# Patient Record
Sex: Male | Born: 1940 | Race: White | Hispanic: No | Smoking: Former smoker
Health system: Southern US, Community
[De-identification: ages and names within clinical notes are randomized; demographics above are authoritative.]

## PROBLEM LIST (undated history)

## (undated) DIAGNOSIS — R918 Other nonspecific abnormal finding of lung field: Secondary | ICD-10-CM

## (undated) DIAGNOSIS — I10 Essential (primary) hypertension: Secondary | ICD-10-CM

## (undated) DIAGNOSIS — L570 Actinic keratosis: Secondary | ICD-10-CM

## (undated) DIAGNOSIS — N400 Enlarged prostate without lower urinary tract symptoms: Secondary | ICD-10-CM

## (undated) DIAGNOSIS — C801 Malignant (primary) neoplasm, unspecified: Secondary | ICD-10-CM

## (undated) DIAGNOSIS — F4310 Post-traumatic stress disorder, unspecified: Secondary | ICD-10-CM

## (undated) DIAGNOSIS — I499 Cardiac arrhythmia, unspecified: Secondary | ICD-10-CM

## (undated) DIAGNOSIS — M1990 Unspecified osteoarthritis, unspecified site: Secondary | ICD-10-CM

## (undated) DIAGNOSIS — E785 Hyperlipidemia, unspecified: Secondary | ICD-10-CM

## (undated) HISTORY — DX: Actinic keratosis: L57.0

## (undated) HISTORY — PX: BACK SURGERY: SHX140

## (undated) HISTORY — PX: PROSTATECTOMY: SHX69

## (undated) HISTORY — PX: OTHER SURGICAL HISTORY: SHX169

---

## 2001-07-11 ENCOUNTER — Inpatient Hospital Stay (HOSPITAL_COMMUNITY): Admission: RE | Admit: 2001-07-11 | Discharge: 2001-07-11 | Payer: Self-pay | Admitting: Neurosurgery

## 2004-11-10 ENCOUNTER — Ambulatory Visit: Payer: Self-pay | Admitting: Gastroenterology

## 2004-11-15 ENCOUNTER — Emergency Department: Payer: Self-pay | Admitting: Emergency Medicine

## 2005-03-01 ENCOUNTER — Inpatient Hospital Stay (HOSPITAL_COMMUNITY): Admission: RE | Admit: 2005-03-01 | Discharge: 2005-03-02 | Payer: Self-pay | Admitting: Neurosurgery

## 2006-10-09 ENCOUNTER — Ambulatory Visit: Payer: Self-pay | Admitting: Urology

## 2007-05-21 DIAGNOSIS — C4491 Basal cell carcinoma of skin, unspecified: Secondary | ICD-10-CM

## 2007-05-21 HISTORY — DX: Basal cell carcinoma of skin, unspecified: C44.91

## 2010-10-13 ENCOUNTER — Ambulatory Visit: Payer: Self-pay | Admitting: Internal Medicine

## 2011-03-20 ENCOUNTER — Ambulatory Visit: Payer: Self-pay | Admitting: Family Medicine

## 2011-07-30 ENCOUNTER — Ambulatory Visit: Payer: Self-pay | Admitting: Family Medicine

## 2012-01-30 ENCOUNTER — Ambulatory Visit: Payer: Self-pay | Admitting: Family Medicine

## 2013-01-28 ENCOUNTER — Ambulatory Visit: Payer: Self-pay | Admitting: Family Medicine

## 2014-02-08 ENCOUNTER — Ambulatory Visit: Payer: Self-pay | Admitting: Family Medicine

## 2014-06-03 DIAGNOSIS — I2510 Atherosclerotic heart disease of native coronary artery without angina pectoris: Secondary | ICD-10-CM | POA: Insufficient documentation

## 2014-06-03 DIAGNOSIS — I471 Supraventricular tachycardia: Secondary | ICD-10-CM | POA: Insufficient documentation

## 2014-08-20 DIAGNOSIS — C61 Malignant neoplasm of prostate: Secondary | ICD-10-CM | POA: Insufficient documentation

## 2015-02-09 ENCOUNTER — Encounter: Payer: Self-pay | Admitting: *Deleted

## 2015-02-10 ENCOUNTER — Ambulatory Visit: Payer: Medicare Other | Admitting: Anesthesiology

## 2015-02-10 ENCOUNTER — Encounter
Admission: RE | Disposition: A | Discharge status: Home / Self Care | Payer: Self-pay | Source: Ambulatory Visit | Attending: Unknown Physician Specialty

## 2015-02-10 ENCOUNTER — Ambulatory Visit
Admission: RE | Admit: 2015-02-10 | Discharge: 2015-02-10 | Disposition: A | Discharge status: Home / Self Care | Payer: Medicare Other | Source: Ambulatory Visit | Attending: Unknown Physician Specialty | Admitting: Unknown Physician Specialty

## 2015-02-10 DIAGNOSIS — K640 First degree hemorrhoids: Secondary | ICD-10-CM | POA: Diagnosis not present

## 2015-02-10 DIAGNOSIS — Z79899 Other long term (current) drug therapy: Secondary | ICD-10-CM | POA: Diagnosis not present

## 2015-02-10 DIAGNOSIS — E785 Hyperlipidemia, unspecified: Secondary | ICD-10-CM | POA: Insufficient documentation

## 2015-02-10 DIAGNOSIS — K621 Rectal polyp: Secondary | ICD-10-CM | POA: Diagnosis not present

## 2015-02-10 DIAGNOSIS — I10 Essential (primary) hypertension: Secondary | ICD-10-CM | POA: Diagnosis not present

## 2015-02-10 DIAGNOSIS — D123 Benign neoplasm of transverse colon: Secondary | ICD-10-CM | POA: Diagnosis not present

## 2015-02-10 DIAGNOSIS — K5730 Diverticulosis of large intestine without perforation or abscess without bleeding: Secondary | ICD-10-CM | POA: Diagnosis not present

## 2015-02-10 DIAGNOSIS — D122 Benign neoplasm of ascending colon: Secondary | ICD-10-CM | POA: Insufficient documentation

## 2015-02-10 DIAGNOSIS — D125 Benign neoplasm of sigmoid colon: Secondary | ICD-10-CM | POA: Diagnosis not present

## 2015-02-10 DIAGNOSIS — Z87891 Personal history of nicotine dependence: Secondary | ICD-10-CM | POA: Diagnosis not present

## 2015-02-10 DIAGNOSIS — Z1211 Encounter for screening for malignant neoplasm of colon: Secondary | ICD-10-CM | POA: Diagnosis present

## 2015-02-10 DIAGNOSIS — F4310 Post-traumatic stress disorder, unspecified: Secondary | ICD-10-CM | POA: Insufficient documentation

## 2015-02-10 DIAGNOSIS — D124 Benign neoplasm of descending colon: Secondary | ICD-10-CM | POA: Insufficient documentation

## 2015-02-10 DIAGNOSIS — Z7982 Long term (current) use of aspirin: Secondary | ICD-10-CM | POA: Insufficient documentation

## 2015-02-10 DIAGNOSIS — Z8546 Personal history of malignant neoplasm of prostate: Secondary | ICD-10-CM | POA: Diagnosis not present

## 2015-02-10 HISTORY — DX: Other nonspecific abnormal finding of lung field: R91.8

## 2015-02-10 HISTORY — DX: Malignant (primary) neoplasm, unspecified: C80.1

## 2015-02-10 HISTORY — PX: COLONOSCOPY: SHX5424

## 2015-02-10 HISTORY — DX: Cardiac arrhythmia, unspecified: I49.9

## 2015-02-10 HISTORY — DX: Essential (primary) hypertension: I10

## 2015-02-10 HISTORY — DX: Hyperlipidemia, unspecified: E78.5

## 2015-02-10 HISTORY — DX: Benign prostatic hyperplasia without lower urinary tract symptoms: N40.0

## 2015-02-10 HISTORY — DX: Post-traumatic stress disorder, unspecified: F43.10

## 2015-02-10 SURGERY — COLONOSCOPY
Anesthesia: General

## 2015-02-10 MED ORDER — FENTANYL CITRATE (PF) 100 MCG/2ML IJ SOLN
INTRAMUSCULAR | Status: DC | PRN
  Administered 2015-02-10: 50 ug via INTRAVENOUS

## 2015-02-10 MED ORDER — SODIUM CHLORIDE 0.9 % IV SOLN: INTRAVENOUS | Status: DC

## 2015-02-10 MED ORDER — PROPOFOL INFUSION 10 MG/ML OPTIME
INTRAVENOUS | Status: DC | PRN
  Administered 2015-02-10: 120 ug/kg/min via INTRAVENOUS

## 2015-02-10 MED ORDER — PROPOFOL 10 MG/ML IV BOLUS
INTRAVENOUS | Status: DC | PRN
  Administered 2015-02-10: 60 mg via INTRAVENOUS

## 2015-02-10 MED ORDER — MIDAZOLAM HCL 5 MG/5ML IJ SOLN
INTRAMUSCULAR | Status: DC | PRN
Start: 2015-02-10 — End: 2015-02-10
  Administered 2015-02-10: 1 mg via INTRAVENOUS

## 2015-02-10 MED ORDER — SODIUM CHLORIDE 0.9 % IV SOLN
INTRAVENOUS | Status: DC
  Administered 2015-02-10: 1000 mL via INTRAVENOUS

## 2015-02-10 NOTE — Op Note (Signed)
[facility]astroenterology Patient Name: Edwin [name] Procedure Date: 02/10/2015 9:14 AM MRN: [id number] Account #: [id number] Date of Birth: 1941-02-04 Admit Type: Outpatient Age: 74 Room: [facility] ENDO ROOM 1 Gender: Male Note Status: Finalized Procedure:         Colonoscopy Indications:       Screening for colorectal malignant neoplasm Providers:         Manya Silvas, MD Referring MD:      [name] (Referring MD) Medicines:         Propofol per Anesthesia Complications:     No immediate complications. Procedure:         Pre-Anesthesia Assessment:                    - After reviewing the risks and benefits, the patient was                     deemed in satisfactory condition to undergo the procedure.                    After obtaining informed consent, the colonoscope was                     passed under direct vision. Throughout the procedure, the                     patient's blood pressure, pulse, and oxygen saturations                     were monitored continuously. The Colonoscope was                     introduced through the anus and advanced to the the cecum,                     identified by appendiceal orifice and ileocecal valve. The                     colonoscopy was performed without difficulty. The patient                     tolerated the procedure well. The quality of the bowel                     preparation was excellent. Findings:      Six sessile polyps were found in the rectum, in the sigmoid colon, in       the descending colon, in the transverse colon, at the hepatic flexure       and in the ascending colon. The polyps were diminutive in size. These       polyps were removed with a jumbo cold forceps. Resection and retrieval       were complete.      A small polyp was found in the ascending colon. The polyp was sessile.       The polyp was removed with a cold snare. Resection and retrieval were       complete.      Internal  hemorrhoids were found during endoscopy. The hemorrhoids were       small, medium-sized and Grade I (internal hemorrhoids that do not       prolapse).      A few small-mouthed diverticula were found in the sigmoid colon and in       the  ascending colon. Impression:        - Seven diminutive polyps in the rectum, in the sigmoid                     colon, in the descending colon, in the transverse colon,                     at the hepatic flexure and in the ascending colon.                     Resected and retrieved.                    - One small polyp in the ascending colon. Resected and                     retrieved.                    - Internal hemorrhoids.                    - Diverticulosis in the sigmoid colon and in the ascending                     colon. Recommendation:    - Await pathology results. Manya Silvas, MD 02/10/2015 9:47:18 AM This report has been signed electronically. Number of Addenda: 0 Note Initiated On: 02/10/2015 9:14 AM Scope Withdrawal Time: 0 hours 18 minutes 59 seconds  Total Procedure Duration: 0 hours 23 minutes 11 seconds       [facility]

## 2015-02-10 NOTE — Anesthesia Preprocedure Evaluation (Signed)
Anesthesia Evaluation  Patient identified by MRN, date of birth, ID band Patient awake    Reviewed: Allergy & Precautions, NPO status , Patient's Chart, lab work & pertinent test results  History of Anesthesia Complications Negative for: history of anesthetic complications  Airway Mallampati: II       Dental  (+) Teeth Intact, Partial Lower   Pulmonary former smoker,    Pulmonary exam normal       Cardiovascular hypertension, Pt. on medications + dysrhythmias Rhythm:Regular     Neuro/Psych negative neurological ROS     GI/Hepatic negative GI ROS, Neg liver ROS,   Endo/Other  negative endocrine ROS  Renal/GU negative Renal ROS     Musculoskeletal negative musculoskeletal ROS (+)   Abdominal Normal abdominal exam  (+)   Peds  Hematology   Anesthesia Other Findings   Reproductive/Obstetrics negative OB ROS                             Anesthesia Physical Anesthesia Plan  ASA: II  Anesthesia Plan: General   Post-op Pain Management:    Induction: Intravenous  Airway Management Planned: Nasal Cannula  Additional Equipment:   Intra-op Plan:   Post-operative Plan:   Informed Consent: I have reviewed the patients History and Physical, chart, labs and discussed the procedure including the risks, benefits and alternatives for the proposed anesthesia with the patient or authorized representative who has indicated his/her understanding and acceptance.     Plan Discussed with: CRNA  Anesthesia Plan Comments:         Anesthesia Quick Evaluation

## 2015-02-10 NOTE — Anesthesia Postprocedure Evaluation (Signed)
  Anesthesia Post-op Note  Patient: Edwin [name]  Procedure(s) Performed: Procedure(s): COLONOSCOPY (N/A)  Anesthesia type:General  Patient location: PACU  Post pain: Pain level controlled  Post assessment: Post-op Vital signs reviewed, Patient's Cardiovascular Status Stable, Respiratory Function Stable, Patent Airway and No signs of Nausea or vomiting  Post vital signs: Reviewed and stable  Last Vitals:  Filed Vitals:   02/10/15 0948  BP: 100/62  Pulse:   Temp: 36 C  Resp: 12    Level of consciousness: awake, alert  and patient cooperative  Complications: No apparent anesthesia complications

## 2015-02-10 NOTE — Transfer of Care (Signed)
Immediate Anesthesia Transfer of Care Note  Patient: Edwin [name]  Procedure(s) Performed: Procedure(s): COLONOSCOPY (N/A)  Patient Location: PACU  Anesthesia Type:General  Level of Consciousness: awake, alert , oriented and patient cooperative  Airway & Oxygen Therapy: Patient Spontanous Breathing and Patient connected to nasal cannula oxygen  Post-op Assessment: Report given to RN and Post -op Vital signs reviewed and stable  Post vital signs: Reviewed and stable  Last Vitals:  Filed Vitals:   02/10/15 0948  BP: 100/62  Pulse:   Temp: 36 C  Resp: 12    Complications: No apparent anesthesia complications

## 2015-02-10 NOTE — H&P (Signed)
Primary Care Physician:  [name], MD Primary Gastroenterologist:  Dr. Vira Agar  Pre-Procedure History & Physical: HPI:  Edwin [name] is a 75 y.o. male is here for an colonoscopy.   Past Medical History  Diagnosis Date  . Cancer     Prostate Cancer  . Hyperlipidemia   . Dysrhythmia     SVT  . Pulmonary nodules   . Hypertension   . BPH (benign prostatic hyperplasia)   . PTSD (post-traumatic stress disorder)     Past Surgical History  Procedure Laterality Date  . Prostatectomy    . Back surgery      Laminectomy x 3  . Surgery for urethral stricture      Prior to Admission medications   Medication Sig Start Date End Date Taking? Authorizing Provider  aspirin EC 81 MG tablet Take 81 mg by mouth daily.   Yes Historical Provider, MD  chlorthalidone (HYGROTON) 25 MG tablet Take 25 mg by mouth daily.   Yes Historical Provider, MD  co-enzyme Q-10 30 MG capsule Take 30 mg by mouth 3 (three) times daily.   Yes Historical Provider, MD  Javier Docker Oil 1000 MG CAPS Take 2 capsules by mouth daily.   Yes Historical Provider, MD  MAGNESIUM GLUCONATE PO Take 400 mg by mouth at bedtime.   Yes Historical Provider, MD  Multiple Vitamin (MULTIVITAMIN) tablet Take 1 tablet by mouth daily.   Yes Historical Provider, MD  Ubiquinol 50 MG CAPS Take 1 tablet by mouth.   Yes Historical Provider, MD    Allergies as of 11/30/2014  . (Not on File)    History reviewed. No pertinent family history.  History   Social History  . Marital Status: Married    Spouse Name: N/A  . Number of Children: N/A  . Years of Education: N/A   Occupational History  . Not on file.   Social History Main Topics  . Smoking status: Former Smoker -- 0.70 packs/day for 5 years    Types: Cigarettes    Quit date: 07/23/1965  . Smokeless tobacco: Never Used  . Alcohol Use: No  . Drug Use: No  . Sexual Activity: Not on file   Other Topics Concern  . Not on file   Social History Narrative    Review of  Systems: See HPI, otherwise negative ROS  Physical Exam: BP 150/103 mmHg  Pulse 90  Temp(Src) 97 F (36.1 C) (Tympanic)  Resp 18  Ht 6' 1.5" (1.867 m)  Wt 102.059 kg (225 lb)  BMI 29.28 kg/m2  SpO2 97% General:   Alert,  pleasant and cooperative in NAD Head:  Normocephalic and atraumatic. Neck:  Supple; no masses or thyromegaly. Lungs:  Clear throughout to auscultation.    Heart:  Regular rate and rhythm. Abdomen:  Soft, nontender and nondistended. Normal bowel sounds, without guarding, and without rebound.   Neurologic:  Alert and  oriented x4;  grossly normal neurologically.  Impression/Plan: Edwin [name] is here for an colonoscopy to be performed for screening  Risks, benefits, limitations, and alternatives regarding  colonoscopy have been reviewed with the patient.  Questions have been answered.  All parties agreeable.   Gaylyn Cheers, MD  02/10/2015, 9:03 AM   Primary Care Physician:  [name], MD Primary Gastroenterologist:  Dr. Vira Agar  Pre-Procedure History & Physical: HPI:  Edwin [name] is a 74 y.o. male is here for an colonoscopy.   Past Medical History  Diagnosis Date  . Cancer  Prostate Cancer  . Hyperlipidemia   . Dysrhythmia     SVT  . Pulmonary nodules   . Hypertension   . BPH (benign prostatic hyperplasia)   . PTSD (post-traumatic stress disorder)     Past Surgical History  Procedure Laterality Date  . Prostatectomy    . Back surgery      Laminectomy x 3  . Surgery for urethral stricture      Prior to Admission medications   Medication Sig Start Date End Date Taking? Authorizing Provider  aspirin EC 81 MG tablet Take 81 mg by mouth daily.   Yes Historical Provider, MD  chlorthalidone (HYGROTON) 25 MG tablet Take 25 mg by mouth daily.   Yes Historical Provider, MD  co-enzyme Q-10 30 MG capsule Take 30 mg by mouth 3 (three) times daily.   Yes Historical Provider, MD  Javier Docker Oil 1000 MG CAPS Take 2 capsules by mouth daily.    Yes Historical Provider, MD  MAGNESIUM GLUCONATE PO Take 400 mg by mouth at bedtime.   Yes Historical Provider, MD  Multiple Vitamin (MULTIVITAMIN) tablet Take 1 tablet by mouth daily.   Yes Historical Provider, MD  Ubiquinol 50 MG CAPS Take 1 tablet by mouth.   Yes Historical Provider, MD    Allergies as of 11/30/2014  . (Not on File)    History reviewed. No pertinent family history.  History   Social History  . Marital Status: Married    Spouse Name: N/A  . Number of Children: N/A  . Years of Education: N/A   Occupational History  . Not on file.   Social History Main Topics  . Smoking status: Former Smoker -- 0.70 packs/day for 5 years    Types: Cigarettes    Quit date: 07/23/1965  . Smokeless tobacco: Never Used  . Alcohol Use: No  . Drug Use: No  . Sexual Activity: Not on file   Other Topics Concern  . Not on file   Social History Narrative    Review of Systems: See HPI, otherwise negative ROS  Physical Exam: BP 150/103 mmHg  Pulse 90  Temp(Src) 97 F (36.1 C) (Tympanic)  Resp 18  Ht 6' 1.5" (1.867 m)  Wt 102.059 kg (225 lb)  BMI 29.28 kg/m2  SpO2 97% General:   Alert,  pleasant and cooperative in NAD Head:  Normocephalic and atraumatic. Neck:  Supple; no masses or thyromegaly. Lungs:  Clear throughout to auscultation.    Heart:  Regular rate and rhythm. Abdomen:  Soft, nontender and nondistended. Normal bowel sounds, without guarding, and without rebound.   Neurologic:  Alert and  oriented x4;  grossly normal neurologically.  Impression/Plan: Edwin [name] is here for an colonoscopy to be performed for screening  Risks, benefits, limitations, and alternatives regarding  colonoscopy have been reviewed with the patient.  Questions have been answered.  All parties agreeable.   Gaylyn Cheers, MD  02/10/2015, 9:03 AM

## 2015-02-11 LAB — SURGICAL PATHOLOGY

## 2015-02-14 ENCOUNTER — Encounter: Payer: Self-pay | Admitting: Unknown Physician Specialty

## 2015-05-19 DIAGNOSIS — E782 Mixed hyperlipidemia: Secondary | ICD-10-CM | POA: Insufficient documentation

## 2015-05-19 DIAGNOSIS — I10 Essential (primary) hypertension: Secondary | ICD-10-CM | POA: Insufficient documentation

## 2016-09-25 ENCOUNTER — Encounter: Payer: Self-pay | Admitting: Family Medicine

## 2016-09-25 ENCOUNTER — Ambulatory Visit (INDEPENDENT_AMBULATORY_CARE_PROVIDER_SITE_OTHER): Payer: Medicare Other | Admitting: Family Medicine

## 2016-09-25 DIAGNOSIS — M9982 Other biomechanical lesions of thoracic region: Secondary | ICD-10-CM | POA: Diagnosis not present

## 2016-09-25 DIAGNOSIS — M999 Biomechanical lesion, unspecified: Secondary | ICD-10-CM | POA: Insufficient documentation

## 2016-09-25 DIAGNOSIS — M5416 Radiculopathy, lumbar region: Secondary | ICD-10-CM | POA: Insufficient documentation

## 2016-09-25 DIAGNOSIS — Z8546 Personal history of malignant neoplasm of prostate: Secondary | ICD-10-CM | POA: Insufficient documentation

## 2016-09-25 DIAGNOSIS — M216X9 Other acquired deformities of unspecified foot: Secondary | ICD-10-CM | POA: Insufficient documentation

## 2016-09-25 MED ORDER — GABAPENTIN 100 MG PO CAPS: 200.0000 mg | ORAL_CAPSULE | Freq: Every day | ORAL | 3 refills | Status: DC

## 2016-09-25 NOTE — Assessment & Plan Note (Signed)
Decision today to treat with OMT was based on Physical Exam  After verbal consent patient was treated with HVLA, ME, FPR techniques in , cervical, thoracic, and sacral areas  Patient tolerated the procedure well with improvement in symptoms  Patient given exercises, stretches and lifestyle modifications  See medications in patient instructions if given  Patient will follow up in 4 weeks

## 2016-09-25 NOTE — Assessment & Plan Note (Signed)
Discussed over-the-counter orthotics, home exercises, work with Product/process development scientist, discussed proper shoes. Follow-up again in 4 weeks. Could be a candidate for custom orthotics if necessary.

## 2016-09-25 NOTE — Progress Notes (Signed)
[name] [address], [place] [address] Phone: [phone] Subjective:    I'm seeing this patient by the request  of:  [name], MD   CC: Foot pain and low back pain  [id number]  Edwin [name] is a 76 y.o. male coming in with complaint of foot pain. Patient states that he is had this pain for quite some time. Seems to be worse in the left greater than right. Patient is very active doing cycling as well as swimming a very regular basis. Patient continues to try to be active. Patient past medical history significant for 3 separate back surgeries but no surgeries in the feet. Patient has noticed significant weakness of raising his large toe. States that this makes it very difficult to do some of the packing. Does not feel like is tripping though. Denies any numbness. Patient states that the feet and her them on a regular basis. Has noticed some more when he is on his bike for an extended amount of time. No swelling.  Patient is also complaining of low back pain. Has had a past medical history significant for 3 previous back surgeries. States he does have some pain from time to time especially on the left side. An states it seems to be though constant now. Denies any fevers, chills, any abnormal weight loss. States it is just more of a soreness. Continues to be active on a regular basis. Rates the severity pain is 6 out of 10 and worsening. Past medical history is significant for prostate cancer.      Past Medical History:  Diagnosis Date  . BPH (benign prostatic hyperplasia)   . Cancer [facility])    Prostate Cancer  . Dysrhythmia    SVT  . Hyperlipidemia   . Hypertension   . PTSD (post-traumatic stress disorder)   . Pulmonary nodules    Past Surgical History:  Procedure Laterality Date  . BACK SURGERY     Laminectomy x 3  . COLONOSCOPY N/A 02/10/2015   Procedure: COLONOSCOPY;  Surgeon: Manya Silvas, MD;  Location: [facility] ENDOSCOPY;   Service: Endoscopy;  Laterality: N/A;  . PROSTATECTOMY    . Surgery for Urethral Stricture     Social History   Social History  . Marital status: Married    Spouse name: N/A  . Number of children: N/A  . Years of education: N/A   Social History Main Topics  . Smoking status: Former Smoker    Packs/day: 0.70    Years: 5.00    Types: Cigarettes    Quit date: 07/23/1965  . Smokeless tobacco: Never Used  . Alcohol use No  . Drug use: No  . Sexual activity: Not Asked   Other Topics Concern  . None   Social History Narrative  . None   Allergies  Allergen Reactions  . Hydrochlorothiazide   . Lipitor [Atorvastatin]   . Lisinopril   . Neosporin [Neomycin-Bacitracin Zn-Polymyx]    No family history on file. No family history rheumatological diseases  Past medical history, social, surgical and family history all reviewed in electronic medical record.  No pertanent information unless stated regarding to the chief complaint.   Review of Systems:Review of systems updated and as accurate as of 09/25/16  No headache, visual changes, nausea, vomiting, diarrhea, constipation, dizziness, abdominal pain, skin rash, fevers, chills, night sweats, weight loss, swollen lymph nodes,  joint swelling, , chest pain, shortness of breath, mood changes. Positive muscle aches, body  aches  Objective  Blood pressure (!) 152/84, pulse 74, height 6\' 1"  (1.854 m), weight 213 lb (96.6 kg). Systems examined below as of 09/25/16   General: No apparent distress alert and oriented x3 mood and affect normal, dressed appropriately.  HEENT: Pupils equal, extraocular movements intact  Respiratory: Patient's speak in full sentences and does not appear short of breath  Cardiovascular: No lower extremity edema, non tender, no erythema  Skin: Warm dry intact with no signs of infection or rash on extremities or on axial skeleton.  Abdomen: Soft nontender  Neuro: Cranial nerves II through XII are intact,  neurovascularly intact in all extremities with 2+ DTRs and 2+ pulses.  Lymph: No lymphadenopathy of posterior or anterior cervical chain or axillae bilaterally.  Gait normal with good balance and coordination.  MSK:  Non tender with full range of motion and good stability and symmetric strength and tone of shoulders, elbows, wrist, hip, knee and ankles bilaterally. Arthritic changes of multiple joints Back Exam:  Inspection: Unremarkable incision well-healed from previous surgeries Motion: Flexion 35 deg, Extension 15 deg, Side Bending to 25 deg bilaterally,  Rotation to 20 deg bilaterally  SLR laying: Negative severe tightness of the hamstrings bilaterally XSLR laying: Negative  Palpable tenderness: Moderate tenderness over the paraspinal musculature of the lumbar spine bilaterally left greater than right. FABER: Severe tightness bilaterally Sensory change: Gross sensation intact to all lumbar and sacral dermatomes.  Reflexes: 2+ at both patellar tendons, 2+ at achilles tendons, Babinski's downgoing.  Strength at foot  Patient has significant weakness of extension of the large toe on the left sign. Foot exam shoes narrow midfoot with breakdown of the longitudinal arch. Patient's Lang Snow of the transverse arch. Weakness of the left noted. Patient is very minimal inversion of the ankle against resistance as well.  Osteopathic findings C2 flexed rotated and side bent right T3 extended rotated and side bent right inhaled third rib T9 extended rotated and side bent left L2 flexed rotated and side bent right Sacrum right on right  Procedure note 97110; 15 minutes spent for Therapeutic exercises as stated in above notes.  This included exercises focusing on stretching, strengthening, with significant focus on eccentric aspects. Low back exercises that included:  Pelvic tilt/bracing instruction to focus on control of the pelvic girdle and lower abdominal muscles  Glute strengthening exercises,  focusing on proper firing of the glutes without engaging the low back muscles Proper stretching techniques for maximum relief for the hamstrings, hip flexors, low back and some rotation where tolerated  Exercises for the foot include:  Stretches to help lengthen the lower leg and plantar fascia areas Theraband exercises for the lower leg and ankle to help strengthen the surrounding area- dorsiflexion, plantarflexion, inversion, eversion Massage rolling on the plantar surface of the foot with a frozen bottle, tennis ball or golf ball Towel or marble pick-ups to strengthen the plantar surface of the foot Weight bearing exercises to increase balance and overall stability    Proper technique shown and discussed handout in great detail with ATC.  All questions were discussed and answered.     Impression and Recommendations:     This case required medical decision making of moderate complexity.      Note: This dictation was prepared with Dragon dictation along with smaller phrase technology. Any transcriptional errors that result from this process are unintentional.

## 2016-09-25 NOTE — Assessment & Plan Note (Signed)
Patient is having somewhat of a lumbar radiculopathy. 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 This is contributing to the weakness of the foot. Discussed with patient about potential further workup which patient states that he does not want any surgical intervention. Patient will start on gabapentin at this time. We discussed icing regimen, discussed proper shoes. Patient will try to make these changes and come back and see me again in 3-4 weeks.

## 2016-09-25 NOTE — Patient Instructions (Signed)
Good to see you.  Ice 20 minutes 2 times daily. Usually after activity and before bed. Exercises 3 times a week.  For the foot  Newton shoes could be good Sp[employer]co orthotics "total support" online would be great Look for thin or slim as well for the biking shoes.  Avoid being barefoot.  I think som eis from your back and start gabapentin 200mg  at night For the back hip flexor is key  Vitamin D 2000 IU daily  See me again in 4 weeks.

## 2016-10-23 ENCOUNTER — Ambulatory Visit (INDEPENDENT_AMBULATORY_CARE_PROVIDER_SITE_OTHER): Payer: Medicare Other | Admitting: Family Medicine

## 2016-10-23 ENCOUNTER — Encounter: Payer: Self-pay | Admitting: Family Medicine

## 2016-10-23 VITALS — BP 136/78 | HR 74 | Resp 16 | Wt 214.5 lb

## 2016-10-23 DIAGNOSIS — M999 Biomechanical lesion, unspecified: Secondary | ICD-10-CM

## 2016-10-23 DIAGNOSIS — M5416 Radiculopathy, lumbar region: Secondary | ICD-10-CM | POA: Diagnosis not present

## 2016-10-23 NOTE — Assessment & Plan Note (Signed)
Decision today to treat with OMT was based on Physical Exam  After verbal consent patient was treated with HVLA, ME, FPR techniques in cervical, thoracic, lumbar and sacral areas  Patient tolerated the procedure well with improvement in symptoms  Patient given exercises, stretches and lifestyle modifications  See medications in patient instructions if given  Patient will follow up in 4-8 weeks 

## 2016-10-23 NOTE — Assessment & Plan Note (Signed)
Stable at moment.   No change in management.

## 2016-10-23 NOTE — Progress Notes (Signed)
Pre-visit discussion using our clinic review tool. No additional management support is needed unless otherwise documented below in the visit note.  

## 2016-10-23 NOTE — Progress Notes (Signed)
[name] [address], [place] [address] Phone: [phone] Subjective:    I'm seeing this patient by the request  of:  [name], MD   CC: Foot pain and low back pain f/u  [id number]  Edwin [name] is a 75 y.o. male coming in with complaint of foot pain. Patient states that he is had this pain for quite some time. Seems to be worse in the left greater than right. Patient though is significantly better with over-the-counter orthotics. No new symptoms. States that everything seems to be feeling much better.  Patient is also complaining of low back pain. Patient states that the manipulation has been significantly improved. Working on the core instability which has also been helpful as well.      Past Medical History:  Diagnosis Date  . BPH (benign prostatic hyperplasia)   . Cancer [facility])    Prostate Cancer  . Dysrhythmia    SVT  . Hyperlipidemia   . Hypertension   . PTSD (post-traumatic stress disorder)   . Pulmonary nodules    Past Surgical History:  Procedure Laterality Date  . BACK SURGERY     Laminectomy x 3  . COLONOSCOPY N/A 02/10/2015   Procedure: COLONOSCOPY;  Surgeon: Manya Silvas, MD;  Location: [facility] ENDOSCOPY;  Service: Endoscopy;  Laterality: N/A;  . PROSTATECTOMY    . Surgery for Urethral Stricture     Social History   Social History  . Marital status: Married    Spouse name: N/A  . Number of children: N/A  . Years of education: N/A   Social History Main Topics  . Smoking status: Former Smoker    Packs/day: 0.70    Years: 5.00    Types: Cigarettes    Quit date: 07/23/1965  . Smokeless tobacco: Never Used  . Alcohol use No  . Drug use: No  . Sexual activity: Not Asked   Other Topics Concern  . None   Social History Narrative  . None   Allergies  Allergen Reactions  . Hydrochlorothiazide   . Lipitor [Atorvastatin]   . Lisinopril   . Neosporin [Neomycin-Bacitracin Zn-Polymyx]     History reviewed. No pertinent family history. No family history rheumatological diseases  Past medical history, social, surgical and family history all reviewed in electronic medical record.  No pertanent information unless stated regarding to the chief complaint.   Review of Systems: No headache, visual changes, nausea, vomiting, diarrhea, constipation, dizziness, abdominal pain, skin rash, fevers, chills, night sweats, weight loss, swollen lymph nodes, body aches, joint swelling, muscle aches, chest pain, shortness of breath, mood changes.    Objective  Blood pressure 136/78, pulse 74, resp. rate 16, weight 214 lb 8 oz (97.3 kg), SpO2 97 %.   Systems examined below as of 10/23/16 General: NAD A&O x3 mood, affect normal  HEENT: Pupils equal, extraocular movements intact no nystagmus Respiratory: not short of breath at rest or with speaking Cardiovascular: No lower extremity edema, non tender Skin: Warm dry intact with no signs of infection or rash on extremities or on axial skeleton. Abdomen: Soft nontender, no masses Neuro: Cranial nerves  intact, neurovascularly intact in all extremities with 2+ DTRs and 2+ pulses. Lymph: No lymphadenopathy appreciated today  Gait normal with good balance and coordination.  MSK:  Non tender with full range of motion and good stability and symmetric strength and tone of shoulders, elbows, wrist, hip, knee and ankles bilaterally. Arthritic changes of multiple joints Back  Exam:  Inspection: Unremarkable incision well-healed from previous surgeries Motion: Flexion 35 deg, Extension 15 deg, Side Bending to 25 deg bilaterally,  Rotation to 20 deg bilaterally  SLR laying: Negative severe tightness of the hamstrings bilaterally XSLR laying: Negative  Palpable tenderness: Tenderness to palpation of the paraspinal musculature lumbar spine record of left FABER: Mild tightness bilaterally. Sensory change: Gross sensation intact to all lumbar and sacral  dermatomes.  Reflexes: 2+ at both patellar tendons, 2+ at achilles tendons, Babinski's downgoing.  Strength at foot  Mild weakness still noted of the left large toe Foot exam shoes narrow midfoot with breakdown of the longitudinal arch. Patient's Lang Snow of the transverse arch.   Osteopathic findings Cervical C2 flexed rotated and side bent right C5 flexed rotated and side bent left T3 extended rotated and side bent right inhaled third rib T7 extended rotated and side bent left L3 flexed rotated and side bent right Sacrum right on right      Impression and Recommendations:     This case required medical decision making of moderate complexity.      Note: This dictation was prepared with Dragon dictation along with smaller phrase technology. Any transcriptional errors that result from this process are unintentional.

## 2016-10-23 NOTE — Patient Instructions (Signed)
Awesome 6 weeks

## 2016-12-04 ENCOUNTER — Ambulatory Visit: Payer: Medicare Other | Admitting: Family Medicine

## 2017-03-13 DIAGNOSIS — D099 Carcinoma in situ, unspecified: Secondary | ICD-10-CM

## 2017-03-13 HISTORY — DX: Carcinoma in situ, unspecified: D09.9

## 2018-01-01 ENCOUNTER — Other Ambulatory Visit: Payer: Self-pay | Admitting: Family Medicine

## 2018-01-01 DIAGNOSIS — R972 Elevated prostate specific antigen [PSA]: Secondary | ICD-10-CM

## 2018-01-10 ENCOUNTER — Ambulatory Visit
Admission: RE | Admit: 2018-01-10 | Discharge: 2018-01-10 | Disposition: A | Discharge status: Home / Self Care | Payer: Medicare Other | Source: Ambulatory Visit | Attending: Family Medicine | Admitting: Family Medicine

## 2018-01-10 ENCOUNTER — Encounter
Admission: RE | Admit: 2018-01-10 | Discharge: 2018-01-10 | Disposition: A | Discharge status: Home / Self Care | Payer: Medicare Other | Source: Ambulatory Visit | Attending: Family Medicine | Admitting: Family Medicine

## 2018-01-10 DIAGNOSIS — M5136 Other intervertebral disc degeneration, lumbar region: Secondary | ICD-10-CM | POA: Insufficient documentation

## 2018-01-10 DIAGNOSIS — R972 Elevated prostate specific antigen [PSA]: Secondary | ICD-10-CM | POA: Insufficient documentation

## 2018-01-10 DIAGNOSIS — Z8546 Personal history of malignant neoplasm of prostate: Secondary | ICD-10-CM | POA: Diagnosis not present

## 2018-01-10 MED ORDER — TECHNETIUM TC 99M MEDRONATE IV KIT
20.0000 | PACK | Freq: Once | INTRAVENOUS | Status: AC | PRN
  Administered 2018-01-10: 21.7 via INTRAVENOUS

## 2018-01-14 ENCOUNTER — Ambulatory Visit
Admission: RE | Admit: 2018-01-14 | Discharge: 2018-01-14 | Disposition: A | Discharge status: Home / Self Care | Payer: Medicare Other | Source: Ambulatory Visit | Attending: Family Medicine | Admitting: Family Medicine

## 2018-01-14 DIAGNOSIS — Z8546 Personal history of malignant neoplasm of prostate: Secondary | ICD-10-CM | POA: Diagnosis present

## 2018-01-14 DIAGNOSIS — R918 Other nonspecific abnormal finding of lung field: Secondary | ICD-10-CM | POA: Insufficient documentation

## 2018-01-14 DIAGNOSIS — R972 Elevated prostate specific antigen [PSA]: Secondary | ICD-10-CM

## 2018-01-14 DIAGNOSIS — Z9079 Acquired absence of other genital organ(s): Secondary | ICD-10-CM | POA: Diagnosis not present

## 2018-01-14 DIAGNOSIS — N3289 Other specified disorders of bladder: Secondary | ICD-10-CM | POA: Diagnosis not present

## 2018-01-14 MED ORDER — IOPAMIDOL (ISOVUE-300) INJECTION 61%
100.0000 mL | Freq: Once | INTRAVENOUS | Status: AC | PRN
  Administered 2018-01-14: 100 mL via INTRAVENOUS

## 2018-09-02 DIAGNOSIS — E6609 Other obesity due to excess calories: Secondary | ICD-10-CM | POA: Insufficient documentation

## 2018-09-02 DIAGNOSIS — Z6830 Body mass index (BMI) 30.0-30.9, adult: Secondary | ICD-10-CM | POA: Insufficient documentation

## 2018-09-08 ENCOUNTER — Other Ambulatory Visit: Payer: Self-pay | Admitting: Family Medicine

## 2018-09-08 DIAGNOSIS — R972 Elevated prostate specific antigen [PSA]: Secondary | ICD-10-CM

## 2018-09-08 DIAGNOSIS — Z8546 Personal history of malignant neoplasm of prostate: Secondary | ICD-10-CM

## 2018-09-19 ENCOUNTER — Ambulatory Visit: Admission: RE | Admit: 2018-09-19 | Payer: Medicare Other | Source: Ambulatory Visit

## 2018-09-19 ENCOUNTER — Ambulatory Visit: Payer: Medicare Other

## 2018-09-22 ENCOUNTER — Encounter
Admission: RE | Admit: 2018-09-22 | Discharge: 2018-09-22 | Disposition: A | Discharge status: Home / Self Care | Payer: Medicare Other | Source: Ambulatory Visit | Attending: Family Medicine | Admitting: Family Medicine

## 2018-09-22 ENCOUNTER — Ambulatory Visit
Admission: RE | Admit: 2018-09-22 | Discharge: 2018-09-22 | Disposition: A | Discharge status: Home / Self Care | Payer: Medicare Other | Source: Ambulatory Visit | Attending: Family Medicine | Admitting: Family Medicine

## 2018-09-22 DIAGNOSIS — Z8546 Personal history of malignant neoplasm of prostate: Secondary | ICD-10-CM

## 2018-09-22 DIAGNOSIS — R972 Elevated prostate specific antigen [PSA]: Secondary | ICD-10-CM | POA: Diagnosis present

## 2018-09-22 LAB — POCT I-STAT CREATININE: Creatinine, Ser: 1 mg/dL (ref 0.61–1.24)

## 2018-09-22 MED ORDER — IOHEXOL 300 MG/ML  SOLN
100.0000 mL | Freq: Once | INTRAMUSCULAR | Status: AC | PRN
  Administered 2018-09-22: 100 mL via INTRAVENOUS

## 2018-09-22 MED ORDER — TECHNETIUM TC 99M MEDRONATE IV KIT
20.0000 | PACK | Freq: Once | INTRAVENOUS | Status: AC | PRN
  Administered 2018-09-22: 22.62 via INTRAVENOUS

## 2019-01-27 DIAGNOSIS — I10 Essential (primary) hypertension: Secondary | ICD-10-CM | POA: Insufficient documentation

## 2019-01-27 DIAGNOSIS — E871 Hypo-osmolality and hyponatremia: Secondary | ICD-10-CM | POA: Insufficient documentation

## 2019-01-27 DIAGNOSIS — U071 COVID-19: Secondary | ICD-10-CM | POA: Insufficient documentation

## 2019-05-01 ENCOUNTER — Other Ambulatory Visit: Payer: Self-pay | Admitting: Family Medicine

## 2019-05-01 DIAGNOSIS — R972 Elevated prostate specific antigen [PSA]: Secondary | ICD-10-CM

## 2019-05-01 DIAGNOSIS — Z8546 Personal history of malignant neoplasm of prostate: Secondary | ICD-10-CM

## 2019-05-05 ENCOUNTER — Other Ambulatory Visit: Payer: Self-pay | Admitting: Family Medicine

## 2019-05-12 ENCOUNTER — Other Ambulatory Visit: Payer: Self-pay | Admitting: Family Medicine

## 2019-05-12 DIAGNOSIS — Z8546 Personal history of malignant neoplasm of prostate: Secondary | ICD-10-CM

## 2019-05-12 DIAGNOSIS — R972 Elevated prostate specific antigen [PSA]: Secondary | ICD-10-CM

## 2019-05-13 ENCOUNTER — Ambulatory Visit
Admission: RE | Admit: 2019-05-13 | Discharge: 2019-05-13 | Disposition: A | Discharge status: Home / Self Care | Payer: Medicare Other | Source: Ambulatory Visit | Attending: Family Medicine | Admitting: Family Medicine

## 2019-05-13 ENCOUNTER — Other Ambulatory Visit: Payer: Self-pay

## 2019-05-13 DIAGNOSIS — R918 Other nonspecific abnormal finding of lung field: Secondary | ICD-10-CM | POA: Insufficient documentation

## 2019-05-13 DIAGNOSIS — R972 Elevated prostate specific antigen [PSA]: Secondary | ICD-10-CM

## 2019-05-13 DIAGNOSIS — Z8546 Personal history of malignant neoplasm of prostate: Secondary | ICD-10-CM

## 2019-05-13 DIAGNOSIS — I700 Atherosclerosis of aorta: Secondary | ICD-10-CM | POA: Diagnosis not present

## 2019-05-13 LAB — POCT I-STAT CREATININE: Creatinine, Ser: 0.8 mg/dL (ref 0.61–1.24)

## 2019-05-13 MED ORDER — IOHEXOL 300 MG/ML  SOLN
100.0000 mL | Freq: Once | INTRAMUSCULAR | Status: AC | PRN
  Administered 2019-05-13: 100 mL via INTRAVENOUS

## 2019-05-13 MED ORDER — TECHNETIUM TC 99M MEDRONATE IV KIT
20.0000 | PACK | Freq: Once | INTRAVENOUS | Status: AC | PRN
  Administered 2019-05-13: 21.612 via INTRAVENOUS

## 2019-06-25 ENCOUNTER — Encounter: Payer: Self-pay | Admitting: Family Medicine

## 2019-06-25 ENCOUNTER — Ambulatory Visit (INDEPENDENT_AMBULATORY_CARE_PROVIDER_SITE_OTHER): Payer: Medicare Other | Admitting: Family Medicine

## 2019-06-25 ENCOUNTER — Telehealth: Payer: Self-pay

## 2019-06-25 VITALS — BP 150/80 | HR 60 | Ht 73.0 in | Wt 220.0 lb

## 2019-06-25 DIAGNOSIS — M9981 Other biomechanical lesions of cervical region: Secondary | ICD-10-CM

## 2019-06-25 DIAGNOSIS — M999 Biomechanical lesion, unspecified: Secondary | ICD-10-CM

## 2019-06-25 DIAGNOSIS — M5416 Radiculopathy, lumbar region: Secondary | ICD-10-CM | POA: Diagnosis not present

## 2019-06-25 NOTE — Progress Notes (Signed)
[name] [address] [place], [place] [address] Phone: [phone] Subjective:   I Edwin [name] am serving as a Education administrator for Dr. Hulan Saas.  This visit occurred during the SARS-CoV-2 public health emergency.  Safety protocols were in place, including screening questions prior to the visit, additional usage of staff PPE, and extensive cleaning of exam room while observing appropriate contact time as indicated for disinfecting solutions.    CC: Low back pain follow-up  [id number]  Edwin [name] is a 78 y.o. male coming in with complaint of back pain. Last seen in 2018 for OMT. Patient states having some increasing tightness.  Rides bikes on a regular routine.  Patient states sitting long time.  Patient denies any radiation down the legs.  Patient states somewhat sometimes can have some stiffness of his muscles.  Seems to do well with chronic vitamin supplementation.  Tries to be active every day.       Past Medical History:  Diagnosis Date  . BPH (benign prostatic hyperplasia)   . Cancer [facility])    Prostate Cancer  . Dysrhythmia    SVT  . Hyperlipidemia   . Hypertension   . PTSD (post-traumatic stress disorder)   . Pulmonary nodules    Past Surgical History:  Procedure Laterality Date  . BACK SURGERY     Laminectomy x 3  . COLONOSCOPY N/A 02/10/2015   Procedure: COLONOSCOPY;  Surgeon: Manya Silvas, MD;  Location: [facility] ENDOSCOPY;  Service: Endoscopy;  Laterality: N/A;  . PROSTATECTOMY    . Surgery for Urethral Stricture     Social History   Socioeconomic History  . Marital status: Married    Spouse name: Not on file  . Number of children: Not on file  . Years of education: Not on file  . Highest education level: Not on file  Occupational History  . Not on file  Social Needs  . Financial resource strain: Not on file  . Food insecurity    Worry: Not on file    Inability: Not on file  . Transportation needs    Medical: Not on  file    Non-medical: Not on file  Tobacco Use  . Smoking status: Former Smoker    Packs/day: 0.70    Years: 5.00    Pack years: 3.50    Types: Cigarettes    Quit date: 07/23/1965    Years since quitting: 53.9  . Smokeless tobacco: Never Used  Substance and Sexual Activity  . Alcohol use: No  . Drug use: No  . Sexual activity: Not on file  Lifestyle  . Physical activity    Days per week: Not on file    Minutes per session: Not on file  . Stress: Not on file  Relationships  . Social Herbalist on phone: Not on file    Gets together: Not on file    Attends religious service: Not on file    Active member of club or organization: Not on file    Attends meetings of clubs or organizations: Not on file    Relationship status: Not on file  Other Topics Concern  . Not on file  Social History Narrative  . Not on file   Allergies  Allergen Reactions  . Hydrochlorothiazide   . Lipitor [Atorvastatin]   . Lisinopril   . Neosporin [Neomycin-Bacitracin Zn-Polymyx]    No family history on file.   Current Outpatient Medications (Cardiovascular):  .  valsartan (DIOVAN) 80 MG tablet, Take by mouth.   Current Outpatient Medications (Analgesics):  .  aspirin EC 81 MG tablet, Take 81 mg by mouth daily.   Current Outpatient Medications (Other):  .  co-enzyme Q-10 30 MG capsule, Take 30 mg by mouth 3 (three) times daily. Marland Kitchen  gabapentin (NEURONTIN) 100 MG capsule, Take 2 capsules (200 mg total) by mouth at bedtime. Edwin [name] Oil 1000 MG CAPS, Take 2 capsules by mouth daily. Marland Kitchen  MAGNESIUM GLUCONATE PO, Take 400 mg by mouth at bedtime. .  Multiple Vitamin (MULTIVITAMIN) tablet, Take 1 tablet by mouth daily. Marland Kitchen  Ubiquinol 50 MG CAPS, Take 1 tablet by mouth.    Past medical history, social, surgical and family history all reviewed in electronic medical record.  No pertanent information unless stated regarding to the chief complaint.   Review of Systems:  No headache, visual  changes, nausea, vomiting, diarrhea, constipation, dizziness, abdominal pain, skin rash, fevers, chills, night sweats, weight loss, swollen lymph nodes, body aches, joint swelling, muscle aches, chest pain, shortness of breath, mood changes.   Objective  Blood pressure (!) 150/80, pulse 60, height 6\' 1"  (1.854 m), weight 220 lb (99.8 kg), SpO2 96 %. Systems examined below as of    General: No apparent distress alert and oriented x3 mood and affect normal, dressed appropriately.  HEENT: Pupils equal, extraocular movements intact  Respiratory: Patient's speak in full sentences and does not appear short of breath  Cardiovascular: No lower extremity edema, non tender, no erythema  Skin: Warm dry intact with no signs of infection or rash on extremities or on axial skeleton.  Abdomen: Soft nontender  Neuro: Cranial nerves II through XII are intact, neurovascularly intact in all extremities with 2+ DTRs and 2+ pulses.  Lymph: No lymphadenopathy of posterior or anterior cervical chain or axillae bilaterally.  Gait normal with good balance and coordination.  MSK:  Non tender with full range of motion and good stability and symmetric strength and tone of shoulders, elbows, wrist, hip, knee and ankles bilaterally.  Moderate arthritic changes of multiple joints.  Patient's low back exam shows slight loss of lordosis, some mild degenerative scoliosis noted.  Patient has significant tightness of the hip flexor bilaterally.   Osteopathic findings  C5 flexed rotated and side bent right T3 extended rotated and side bent right inhaled third rib T6 extended rotated and side bent left L1 flexed rotated and side bent right Sacrum right on right    Impression and Recommendations:     This case required medical decision making of moderate complexity. The above documentation has been reviewed and is accurate and complete [name], DO       Note: This dictation was prepared with Dragon dictation  along with smaller phrase technology. Any transcriptional errors that result from this process are unintentional.

## 2019-06-25 NOTE — Telephone Encounter (Signed)
Spoke to a patient by the name of [name] on behalf of Bekim Venne. Patient is scheduled today at 11:45 for his back.

## 2019-06-25 NOTE — Patient Instructions (Signed)
Good to see you.  Exercises 3 times a week. Tart cherry extract 1200mg  at night See me again in 4-5 weeks

## 2019-06-25 NOTE — Assessment & Plan Note (Signed)
Likely no radicular symptoms at this time.  Responded well to osteopathic manipulation.  Discussed which activities to do which wants to avoid.  Discussed icing regimen and home exercise, patient wants to continue with vitamin supplementations.  Follow-up with me 4 to 8 weeks

## 2019-06-25 NOTE — Assessment & Plan Note (Signed)
Decision today to treat with OMT was based on Physical Exam  After verbal consent patient was treated with HVLA, ME, FPR techniques in cervical, thoracic, rib lumbar and sacral areas  Patient tolerated the procedure well with improvement in symptoms  Patient given exercises, stretches and lifestyle modifications  See medications in patient instructions if given  Patient will follow up in 4-8 weeks 

## 2019-08-14 ENCOUNTER — Ambulatory Visit: Payer: Medicare Other | Admitting: Family Medicine

## 2019-08-14 ENCOUNTER — Encounter: Payer: Self-pay | Admitting: Family Medicine

## 2019-08-14 ENCOUNTER — Other Ambulatory Visit: Payer: Self-pay

## 2019-08-14 VITALS — BP 130/80 | HR 63 | Ht 73.0 in | Wt 219.0 lb

## 2019-08-14 DIAGNOSIS — M9981 Other biomechanical lesions of cervical region: Secondary | ICD-10-CM

## 2019-08-14 DIAGNOSIS — M5416 Radiculopathy, lumbar region: Secondary | ICD-10-CM | POA: Diagnosis not present

## 2019-08-14 DIAGNOSIS — M999 Biomechanical lesion, unspecified: Secondary | ICD-10-CM | POA: Diagnosis not present

## 2019-08-14 MED ORDER — VITAMIN D (ERGOCALCIFEROL) 1.25 MG (50000 UNIT) PO CAPS: 50000.0000 [IU] | ORAL_CAPSULE | ORAL | 0 refills | Status: DC

## 2019-08-14 MED ORDER — GABAPENTIN 100 MG PO CAPS: 200.0000 mg | ORAL_CAPSULE | Freq: Every day | ORAL | 3 refills | Status: DC

## 2019-08-14 NOTE — Progress Notes (Signed)
Edwin [name] Phone: [phone] Subjective:   Edwin [name], am serving as a scribe for Dr. Hulan Saas.  This visit occurred during the SARS-CoV-2 public health emergency.  Safety protocols were in place, including screening questions prior to the visit, additional usage of staff PPE, and extensive cleaning of exam room while observing appropriate contact time as indicated for disinfecting solutions.   I'm seeing this patient by the request  of:  [name], MD  CC: Low back pain follow-up  [id number]  Edwin [name] is a 79 y.o. male coming in with complaint of back and hip pain. States he has some numbness in the thigh of the right leg. OMT. Known arthritic changes overall.  History of prostate cancer.  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 BPH (benign prostatic hyperplasia)   . Cancer [facility])    Prostate Cancer  . Dysrhythmia    SVT  . Hyperlipidemia   . Hypertension   . PTSD (post-traumatic stress disorder)   . Pulmonary nodules    Past Surgical History:  Procedure Laterality Date  . BACK SURGERY     Laminectomy x 3  . COLONOSCOPY N/A 02/10/2015   Procedure: COLONOSCOPY;  Surgeon: Manya Silvas, MD;  Location: [facility] ENDOSCOPY;  Service: Endoscopy;  Laterality: N/A;  . PROSTATECTOMY    . Surgery for Urethral Stricture     Social History   Socioeconomic History  . Marital status: Married    Spouse name: Not on file  . Number of children: Not on file  . Years of education: Not on file  . Highest education level: Not on file  Occupational History  . Not on file  Tobacco Use  . Smoking status: Former Smoker    Packs/day: 0.70    Years: 5.00    Pack years: 3.50    Types: Cigarettes    Quit date: 07/23/1965    Years since quitting: 54.0  . Smokeless tobacco: Never Used  Substance and  Sexual Activity  . Alcohol use: No  . Drug use: No  . Sexual activity: Not on file  Other Topics Concern  . Not on file  Social History Narrative  . Not on file   Social Determinants of Health   Financial Resource Strain:   . Difficulty of Paying Living Expenses: Not on file  Food Insecurity:   . Worried About Charity fundraiser in the Last Year: Not on file  . Ran Out of Food in the Last Year: Not on file  Transportation Needs:   . Lack of Transportation (Medical): Not on file  . Lack of Transportation (Non-Medical): Not on file  Physical Activity:   . Days of Exercise per Week: Not on file  . Minutes of Exercise per Session: Not on file  Stress:   . Feeling of Stress : Not on file  Social Connections:   . Frequency of Communication with Friends and Family: Not on file  . Frequency of Social Gatherings with Friends and Family: Not on file  . Attends Religious Services: Not on file  . Active Member of Clubs or Organizations: Not on file  . Attends Archivist Meetings: Not on file  . Marital Status: Not on file   Allergies  Allergen Reactions  . Hydrochlorothiazide   . Lipitor [Atorvastatin]   . Lisinopril   .  Neosporin [Neomycin-Bacitracin Zn-Polymyx]    History reviewed. No pertinent family history.   Current Outpatient Medications (Cardiovascular):  .  valsartan (DIOVAN) 80 MG tablet, Take by mouth.   Current Outpatient Medications (Analgesics):  .  aspirin EC 81 MG tablet, Take 81 mg by mouth daily.   Current Outpatient Medications (Other):  .  co-enzyme Q-10 30 MG capsule, Take 30 mg by mouth 3 (three) times daily. Marland Kitchen  gabapentin (NEURONTIN) 100 MG capsule, Take 2 capsules (200 mg total) by mouth at bedtime. Edwin [name] Oil 1000 MG CAPS, Take 2 capsules by mouth daily. Marland Kitchen  MAGNESIUM GLUCONATE PO, Take 400 mg by mouth at bedtime. .  Multiple Vitamin (MULTIVITAMIN) tablet, Take 1 tablet by mouth daily. Marland Kitchen  Ubiquinol 50 MG CAPS, Take 1 tablet by mouth. .   gabapentin (NEURONTIN) 100 MG capsule, Take 2 capsules (200 mg total) by mouth at bedtime. .  Vitamin D, Ergocalciferol, (DRISDOL) 1.25 MG (50000 UNIT) CAPS capsule, Take 1 capsule (50,000 Units total) by mouth every 7 (seven) days.    Past medical history, social, surgical and family history all reviewed in electronic medical record.  No pertanent information unless stated regarding to the chief complaint.   Review of Systems:  No headache, visual changes, nausea, vomiting, diarrhea, constipation, dizziness, abdominal pain, skin rash, fevers, chills, night sweats, weight loss, swollen lymph nodes, body aches, joint swelling, chest pain, shortness of breath, mood changes. POSITIVE muscle aches  Objective  Blood pressure 130/80, pulse 63, height 6\' 1"  (1.854 m), weight 219 lb (99.3 kg), SpO2 98 %.   General: No apparent distress alert and oriented x3 mood and affect normal, dressed appropriately.  HEENT: Pupils equal, extraocular movements intact  Respiratory: Patient's speak in full sentences and does not appear short of breath  Cardiovascular: No lower extremity edema, non tender, no erythema  Skin: Warm dry intact with no signs of infection or rash on extremities or on axial skeleton.  Abdomen: Soft nontender  Neuro: Cranial nerves II through XII are intact, neurovascularly intact in all extremities with 2+ DTRs and 2+ pulses.  Lymph: No lymphadenopathy of posterior or anterior cervical chain or axillae bilaterally.  Gait normal with good balance and coordination.  MSK: Moderate to severe arthritic changes. Patient back exam has severe loss of lordosis.  Patient does have some tenderness noted.  Significant amount of muscle spasms in different areas.  Patient has limited range of motion in all planes of at least 10 degrees.  Significant tightness noted of the hamstrings bilaterally.  No true radicular symptoms though.  4-5 strength in lower extremities bilaterally.  Osteopathic findings    C4 flexed rotated and side bent left C6 flexed rotated and side bent left T3 extended rotated and side bent right inhaled third rib T9 extended rotated and side bent left L2 flexed rotated and side bent right Sacrum right on right     Impression and Recommendations:     This case required medical decision making of moderate complexity. The above documentation has been reviewed and is accurate and complete Edwin Pulley, DO       Note: This dictation was prepared with Dragon dictation along with smaller phrase technology. Any transcriptional errors that result from this process are unintentional.

## 2019-08-14 NOTE — Assessment & Plan Note (Signed)
Decision today to treat with OMT was based on Physical Exam  After verbal consent patient was treated with ME, FPR techniques in cervical, thoracic, rib, lumbar and sacral areas  Patient tolerated the procedure well with improvement in symptoms  Patient given exercises, stretches and lifestyle modifications  See medications in patient instructions if given  Patient will follow up in 4-8 weeks

## 2019-08-14 NOTE — Assessment & Plan Note (Signed)
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  Responding fairly well to osteopathic manipulation.  Discussed which activities to do which wants to avoid.  Patient is doing relatively well with conservative therapy but started on low-dose gabapentin and warned of potential side effects.  Follow-up again in 4 to 8 weeks

## 2019-08-14 NOTE — Patient Instructions (Addendum)
Good to see you Gabapentin 200 mg at night Once weekly vitamin D See me again in 6 weeks

## 2019-09-25 ENCOUNTER — Other Ambulatory Visit: Payer: Self-pay

## 2019-09-25 ENCOUNTER — Encounter: Payer: Self-pay | Admitting: Family Medicine

## 2019-09-25 ENCOUNTER — Ambulatory Visit: Payer: Medicare Other | Admitting: Family Medicine

## 2019-09-25 VITALS — BP 130/80 | HR 62 | Ht 73.0 in | Wt 218.0 lb

## 2019-09-25 DIAGNOSIS — M999 Biomechanical lesion, unspecified: Secondary | ICD-10-CM

## 2019-09-25 DIAGNOSIS — M9981 Other biomechanical lesions of cervical region: Secondary | ICD-10-CM | POA: Diagnosis not present

## 2019-09-25 DIAGNOSIS — M5416 Radiculopathy, lumbar region: Secondary | ICD-10-CM

## 2019-09-25 DIAGNOSIS — I471 Supraventricular tachycardia: Secondary | ICD-10-CM | POA: Diagnosis not present

## 2019-09-25 NOTE — Assessment & Plan Note (Signed)
Decision today to treat with OMT was based on Physical Exam  After verbal consent patient was treated with HVLA, ME, FPR techniques in cervical, thoracic, rib,  lumbar and sacral areas  Patient tolerated the procedure well with improvement in symptoms  Patient given exercises, stretches and lifestyle modifications  See medications in patient instructions if given  Patient will follow up in 4-8 weeks 

## 2019-09-25 NOTE — Patient Instructions (Signed)
See me again in 6 weeks 

## 2019-09-25 NOTE — Progress Notes (Signed)
[place] [address] [place] Phone: [phone] Subjective:   I Edwin [name] am serving as a Education administrator for Dr. Hulan Saas.  This visit occurred during the SARS-CoV-2 public health emergency.  Safety protocols were in place, including screening questions prior to the visit, additional usage of staff PPE, and extensive cleaning of exam room while observing appropriate contact time as indicated for disinfecting solutions.   I'm seeing this patient by the request  of:  [name], MD  CC: Low back pain follow-up  [id number]  Edwin [name] is a 79 y.o. male coming in with complaint of back pain. Last seen on 08/14/2019 for OMT. Patient states he is in a bit of pain today. Patient has had some radicular symptoms previously. Discussed with the x-ray stable at the moment. Given the exercises fairly regularly, continues to bicycle very regularly. Denies any pain that is stopping him from activity.    Past Medical History:  Diagnosis Date  . BPH (benign prostatic hyperplasia)   . Cancer [facility])    Prostate Cancer  . Dysrhythmia    SVT  . Hyperlipidemia   . Hypertension   . PTSD (post-traumatic stress disorder)   . Pulmonary nodules    Past Surgical History:  Procedure Laterality Date  . BACK SURGERY     Laminectomy x 3  . COLONOSCOPY N/A 02/10/2015   Procedure: COLONOSCOPY;  Surgeon: Manya Silvas, MD;  Location: [facility] ENDOSCOPY;  Service: Endoscopy;  Laterality: N/A;  . PROSTATECTOMY    . Surgery for Urethral Stricture     Social History   Socioeconomic History  . Marital status: Married    Spouse name: Not on file  . Number of children: Not on file  . Years of education: Not on file  . Highest education level: Not on file  Occupational History  . Not on file  Tobacco Use  . Smoking status: Former Smoker    Packs/day: 0.70    Years: 5.00    Pack years: 3.50    Types: Cigarettes    Quit date: 07/23/1965   Years since quitting: 54.2  . Smokeless tobacco: Never Used  Substance and Sexual Activity  . Alcohol use: No  . Drug use: No  . Sexual activity: Not on file  Other Topics Concern  . Not on file  Social History Narrative  . Not on file   Social Determinants of Health   Financial Resource Strain:   . Difficulty of Paying Living Expenses: Not on file  Food Insecurity:   . Worried About Charity fundraiser in the Last Year: Not on file  . Ran Out of Food in the Last Year: Not on file  Transportation Needs:   . Lack of Transportation (Medical): Not on file  . Lack of Transportation (Non-Medical): Not on file  Physical Activity:   . Days of Exercise per Week: Not on file  . Minutes of Exercise per Session: Not on file  Stress:   . Feeling of Stress : Not on file  Social Connections:   . Frequency of Communication with Friends and Family: Not on file  . Frequency of Social Gatherings with Friends and Family: Not on file  . Attends Religious Services: Not on file  . Active Member of Clubs or Organizations: Not on file  . Attends Archivist Meetings: Not on file  . Marital Status: Not on file   Allergies  Allergen Reactions  . Hydrochlorothiazide   .  Lipitor [Atorvastatin]   . Lisinopril   . Neosporin [Neomycin-Bacitracin Zn-Polymyx]    No family history on file.   Current Outpatient Medications (Cardiovascular):  .  valsartan (DIOVAN) 80 MG tablet, Take by mouth.   Current Outpatient Medications (Analgesics):  .  aspirin EC 81 MG tablet, Take 81 mg by mouth daily.   Current Outpatient Medications (Other):  .  co-enzyme Q-10 30 MG capsule, Take 30 mg by mouth 3 (three) times daily. Marland Kitchen  gabapentin (NEURONTIN) 100 MG capsule, Take 2 capsules (200 mg total) by mouth at bedtime. .  gabapentin (NEURONTIN) 100 MG capsule, Take 2 capsules (200 mg total) by mouth at bedtime. Javier Docker Oil 1000 MG CAPS, Take 2 capsules by mouth daily. Marland Kitchen  MAGNESIUM GLUCONATE PO, Take  400 mg by mouth at bedtime. .  Multiple Vitamin (MULTIVITAMIN) tablet, Take 1 tablet by mouth daily. Marland Kitchen  Ubiquinol 50 MG CAPS, Take 1 tablet by mouth. .  Vitamin D, Ergocalciferol, (DRISDOL) 1.25 MG (50000 UNIT) CAPS capsule, Take 1 capsule (50,000 Units total) by mouth every 7 (seven) days.   Reviewed prior external information including notes and imaging from  primary care provider As well as notes that were available from care everywhere and other healthcare systems.  Past medical history, social, surgical and family history all reviewed in electronic medical record.  No pertanent information unless stated regarding to the chief complaint.   Review of Systems:  No headache, visual changes, nausea, vomiting, diarrhea, constipation, dizziness, abdominal pain, skin rash, fevers, chills, night sweats, weight loss, swollen lymph nodes,  joint swelling, chest pain, shortness of breath, mood changes. POSITIVE muscle aches, body aches  Objective  Blood pressure 130/80, pulse 62, height 6\' 1"  (1.854 m), weight 218 lb (98.9 kg), SpO2 97 %.   General: No apparent distress alert and oriented x3 mood and affect normal, dressed appropriately.  HEENT: Pupils equal, extraocular movements intact  Respiratory: Patient's speak in full sentences and does not appear short of breath  Cardiovascular: No lower extremity edema, non tender, no erythema  Skin: Warm dry intact with no signs of infection or rash on extremities or on axial skeleton.  Abdomen: Soft nontender  Neuro: Cranial nerves II through XII are intact, neurovascularly intact in all extremities with 2+ DTRs and 2+ pulses.  Lymph: No lymphadenopathy of posterior or anterior cervical chain or axillae bilaterally.  Gait normal with good balance and coordination.  MSK:  tender with arthritic changes of multiple joints but good muscle bulk still noted. Low back exam does have some loss of balance with some very mild degenerative scoliosis. Significant  tightness likely release prednisone all times in the last lacks 15 degrees of extension patient does have some tightness with straight leg test but no radicular symptoms.  Osteopathic findings C2 flexed rotated and side bent left C6 flexed rotated and side bent left T5 extended rotated and side bent left inhaled rib T9 extended rotated and side bent left L2 flexed rotated and side bent right L5 flexed rotated and side bent right Sacrum right on right    Impression and Recommendations:     This case required medical decision making of moderate complexity. The above documentation has been reviewed and is accurate and complete [name], DO       Note: This dictation was prepared with Dragon dictation along with smaller phrase technology. Any transcriptional errors that result from this process are unintentional.

## 2019-09-25 NOTE — Assessment & Plan Note (Signed)
Stable at the moment. Does have degenerative disc disease but has responded very well to conservative therapy at this time. Discussed home exercises and icing regimen. Follow-up with me again in 4 to 8 weeks

## 2019-11-06 ENCOUNTER — Other Ambulatory Visit: Payer: Self-pay

## 2019-11-06 ENCOUNTER — Encounter: Payer: Self-pay | Admitting: Family Medicine

## 2019-11-06 ENCOUNTER — Ambulatory Visit (INDEPENDENT_AMBULATORY_CARE_PROVIDER_SITE_OTHER): Payer: Medicare Other | Admitting: Family Medicine

## 2019-11-06 VITALS — BP 110/60 | HR 79 | Ht 73.0 in | Wt 223.0 lb

## 2019-11-06 DIAGNOSIS — M999 Biomechanical lesion, unspecified: Secondary | ICD-10-CM | POA: Diagnosis not present

## 2019-11-06 DIAGNOSIS — M9901 Segmental and somatic dysfunction of cervical region: Secondary | ICD-10-CM | POA: Diagnosis not present

## 2019-11-06 DIAGNOSIS — M5416 Radiculopathy, lumbar region: Secondary | ICD-10-CM | POA: Diagnosis not present

## 2019-11-06 NOTE — Progress Notes (Signed)
[place] Phone: [phone] Subjective:   [name], am serving as a scribe for Dr. Hulan Saas. This visit occurred during the SARS-CoV-2 public health emergency.  Safety protocols were in place, including screening questions prior to the visit, additional usage of staff PPE, and extensive cleaning of exam room while observing appropriate contact time as indicated for disinfecting solutions.   I'm seeing this patient by the request  of:  [name], MD  CC: Low back pain follow-up  [id number]  NILTON FISCHETTI is a 79 y.o. male coming in with complaint of back pain. Last seen on 09/25/2019 for OMT. Patient states that he is here for adjustment.  Patient has had some tightness.  Known spinal stenosis.  Patient denies any radicular symptoms.  Patient does more than 100 miles in a week on his bike does seem to have some increasing discomfort and tightness.  He tries to do the exercises fairly regularly.  Right calf strain 5 weeks ago. Pain has subsided.  No significant pain whatsoever at this time.      Past Medical History:  Diagnosis Date  . BPH (benign prostatic hyperplasia)   . Cancer [facility])    Prostate Cancer  . Dysrhythmia    SVT  . Hyperlipidemia   . Hypertension   . PTSD (post-traumatic stress disorder)   . Pulmonary nodules    Past Surgical History:  Procedure Laterality Date  . BACK SURGERY     Laminectomy x 3  . COLONOSCOPY N/A 02/10/2015   Procedure: COLONOSCOPY;  Surgeon: Manya Silvas, MD;  Location: [facility] ENDOSCOPY;  Service: Endoscopy;  Laterality: N/A;  . PROSTATECTOMY    . Surgery for Urethral Stricture     Social History   Socioeconomic History  . Marital status: Married    Spouse name: Not on file  . Number of children: Not on file  . Years of education: Not on file  . Highest education level: Not on file  Occupational History  . Not on file  Tobacco Use  .  Smoking status: Former Smoker    Packs/day: 0.70    Years: 5.00    Pack years: 3.50    Types: Cigarettes    Quit date: 07/23/1965    Years since quitting: 54.3  . Smokeless tobacco: Never Used  Substance and Sexual Activity  . Alcohol use: No  . Drug use: No  . Sexual activity: Not on file  Other Topics Concern  . Not on file  Social History Narrative  . Not on file   Social Determinants of Health   Financial Resource Strain:   . Difficulty of Paying Living Expenses:   Food Insecurity:   . Worried About Charity fundraiser in the Last Year:   . Arboriculturist in the Last Year:   Transportation Needs:   . Film/video editor (Medical):   Marland Kitchen Lack of Transportation (Non-Medical):   Physical Activity:   . Days of Exercise per Week:   . Minutes of Exercise per Session:   Stress:   . Feeling of Stress :   Social Connections:   . Frequency of Communication with Friends and Family:   . Frequency of Social Gatherings with Friends and Family:   . Attends Religious Services:   . Active Member of Clubs or Organizations:   . Attends Archivist Meetings:   Marland Kitchen Marital Status:    Allergies  Allergen  Reactions  . Hydrochlorothiazide   . Lipitor [Atorvastatin]   . Lisinopril   . Neosporin [Neomycin-Bacitracin Zn-Polymyx]    No family history on file.   Current Outpatient Medications (Cardiovascular):  .  valsartan (DIOVAN) 80 MG tablet, Take by mouth.   Current Outpatient Medications (Analgesics):  .  aspirin EC 81 MG tablet, Take 81 mg by mouth daily.   Current Outpatient Medications (Other):  .  co-enzyme Q-10 30 MG capsule, Take 30 mg by mouth 3 (three) times daily. Marland Kitchen  gabapentin (NEURONTIN) 100 MG capsule, Take 2 capsules (200 mg total) by mouth at bedtime. .  gabapentin (NEURONTIN) 100 MG capsule, Take 2 capsules (200 mg total) by mouth at bedtime. Javier Docker Oil 1000 MG CAPS, Take 2 capsules by mouth daily. Marland Kitchen  MAGNESIUM GLUCONATE PO, Take 400 mg by mouth at  bedtime. .  Multiple Vitamin (MULTIVITAMIN) tablet, Take 1 tablet by mouth daily. Marland Kitchen  Ubiquinol 50 MG CAPS, Take 1 tablet by mouth. .  Vitamin D, Ergocalciferol, (DRISDOL) 1.25 MG (50000 UNIT) CAPS capsule, Take 1 capsule (50,000 Units total) by mouth every 7 (seven) days.   Reviewed prior external information including notes and imaging from  primary care provider As well as notes that were available from care everywhere and other healthcare systems.  Past medical history, social, surgical and family history all reviewed in electronic medical record.  No pertanent information unless stated regarding to the chief complaint.   Review of Systems:  No headache, visual changes, nausea, vomiting, diarrhea, constipation, dizziness, abdominal pain, skin rash, fevers, chills, night sweats, weight loss, swollen lymph nodes, body aches, joint swelling, chest pain, shortness of breath, mood changes. POSITIVE muscle aches  Objective  Blood pressure 110/60, pulse 79, height 6\' 1"  (1.854 m), weight 223 lb (101.2 kg), SpO2 95 %.   General: No apparent distress alert and oriented x3 mood and affect normal, dressed appropriately.  HEENT: Pupils equal, extraocular movements intact  Respiratory: Patient's speak in full sentences and does not appear short of breath  Cardiovascular: No lower extremity edema, non tender, no erythema  Neuro: Cranial nerves II through XII are intact, neurovascularly intact in all extremities with 2+ DTRs and 2+ pulses.  Gait normal with good balance and coordination.  MSK:  Non tender with full range of motion and good stability and symmetric strength and tone of shoulders, elbows, wrist, hip, knee and ankles bilaterally.  Arthritic changes of multiple joints  Patient's low back does have significant loss of lordosis and some mild degenerative scoliosis.  Loss of range of motion in all planes of at least 5 to 10 degrees.  Tightness noted with FABER test bilaterally.  No radicular  symptoms with straight leg test.  5 out of 5 strength in lower extremities.  Osteopathic findings  C7 flexed rotated and side bent left T3 extended rotated and side bent right inhaled third rib T9 extended rotated and side bent left L1 flexed rotated and side bent right Sacrum right on right     Impression and Recommendations:     This case required medical decision making of moderate complexity. The above documentation has been reviewed and is accurate and complete [name], DO       Note: This dictation was prepared with Dragon dictation along with smaller phrase technology. Any transcriptional errors that result from this process are unintentional.

## 2019-11-06 NOTE — Assessment & Plan Note (Signed)
Significant spinal stenosis.  Discussed which appears to do which wants to avoid.  Patient is to increase activity slowly over the course the next several weeks.  Discussed home exercises and icing regimen.  Follow-up again in 4 to 8 weeks.

## 2019-11-06 NOTE — Patient Instructions (Signed)
See me in 6 weeks

## 2019-11-06 NOTE — Assessment & Plan Note (Signed)

## 2019-11-10 ENCOUNTER — Other Ambulatory Visit: Payer: Self-pay

## 2019-11-10 ENCOUNTER — Ambulatory Visit: Payer: Medicare Other

## 2019-11-10 ENCOUNTER — Encounter: Payer: Self-pay | Admitting: Podiatry

## 2019-11-10 ENCOUNTER — Ambulatory Visit: Payer: Medicare Other | Admitting: Podiatry

## 2019-11-10 VITALS — BP 141/85 | HR 78 | Temp 97.1°F

## 2019-11-10 DIAGNOSIS — G5793 Unspecified mononeuropathy of bilateral lower limbs: Secondary | ICD-10-CM | POA: Diagnosis not present

## 2019-11-10 NOTE — Progress Notes (Signed)
Subjective:  Patient ID: Edwin [name], male    DOB: 08-27-1940,  MRN: [id number] HPI Chief Complaint  Patient presents with  . Foot Exam    Patient would like general exam of feet, no specific issues, very active in biking  . New Patient (Initial Visit)    79 y.o. male presents with the above complaint.   ROS: Denies fever chills nausea vomiting muscle aches pains calf pain back pain chest pain shortness of breath.  Does relate a history of back surgeries x3 the last one was performed by Dr. Arnoldo Morale in 2008 there is no metal implant.  Past Medical History:  Diagnosis Date  . BPH (benign prostatic hyperplasia)   . Cancer [facility])    Prostate Cancer  . Dysrhythmia    SVT  . Hyperlipidemia   . Hypertension   . PTSD (post-traumatic stress disorder)   . Pulmonary nodules    Past Surgical History:  Procedure Laterality Date  . BACK SURGERY     Laminectomy x 3  . COLONOSCOPY N/A 02/10/2015   Procedure: COLONOSCOPY;  Surgeon: Manya Silvas, MD;  Location: [facility] ENDOSCOPY;  Service: Endoscopy;  Laterality: N/A;  . PROSTATECTOMY    . Surgery for Urethral Stricture      Current Outpatient Medications:  .  aspirin EC 81 MG tablet, Take 81 mg by mouth daily., Disp: , Rfl:  .  bicalutamide (CASODEX) 50 MG tablet, TK 1 T PO QD FOR 7 DAYS PRIOR TO INJECTION OF LEUPROLIDE ACETATE THEN 1 T QD FOR 5 DAYS AFTER, Disp: , Rfl:  .  co-enzyme Q-10 30 MG capsule, Take 30 mg by mouth 3 (three) times daily., Disp: , Rfl:  .  gabapentin (NEURONTIN) 100 MG capsule, Take 2 capsules (200 mg total) by mouth at bedtime., Disp: 60 capsule, Rfl: 3 .  gabapentin (NEURONTIN) 100 MG capsule, Take 2 capsules (200 mg total) by mouth at bedtime., Disp: 180 capsule, Rfl: 3 .  itraconazole (SPORANOX) 100 MG capsule, Take 100 mg by mouth 2 (two) times daily., Disp: , Rfl:  .  Krill Oil 1000 MG CAPS, Take 2 capsules by mouth daily., Disp: , Rfl:  .  MAGNESIUM GLUCONATE PO, Take 400 mg by mouth at bedtime., Disp: ,  Rfl:  .  Multiple Vitamin (MULTIVITAMIN) tablet, Take 1 tablet by mouth daily., Disp: , Rfl:  .  Ubiquinol 50 MG CAPS, Take 1 tablet by mouth., Disp: , Rfl:  .  valsartan (DIOVAN) 80 MG tablet, Take by mouth., Disp: , Rfl:  .  Vitamin D, Ergocalciferol, (DRISDOL) 1.25 MG (50000 UNIT) CAPS capsule, Take 1 capsule (50,000 Units total) by mouth every 7 (seven) days., Disp: 12 capsule, Rfl: 0  Allergies  Allergen Reactions  . Amoxicillin-Pot Clavulanate Diarrhea  . Bacitracin-Neomycin-Polymyxin Hives  . Hydrochlorothiazide   . Lipitor [Atorvastatin]   . Lisinopril   . Neosporin [Neomycin-Bacitracin Zn-Polymyx]    Review of Systems Objective:   Vitals:   11/10/19 1001  BP: (!) 141/85  Pulse: 78  Temp: (!) 97.1 F (36.2 C)    General: Well developed, nourished, in no acute distress, alert and oriented x3   Dermatological: Skin is warm, dry and supple bilateral. Nails x 10 are well maintained; remaining integument appears unremarkable at this time. There are no open sores, no preulcerative lesions, no rash or signs of infection present.  Vascular: Dorsalis Pedis artery and Posterior Tibial artery pedal pulses are 2/4 bilateral with immedate capillary fill time. Pedal hair growth present. No varicosities  and no lower extremity edema present bilateral.   Neruologic: Grossly intact via light touch bilateral. Vibratory intact via tuning fork bilateral. Protective threshold with Semmes Wienstein monofilament intact to all pedal sites bilateral. Patellar and Achilles deep tendon reflexes 2+ bilateral. No Babinski or clonus noted bilateral.   Musculoskeletal: No gross boney pedal deformities bilateral. No pain, crepitus, or limitation noted with foot and ankle range of motion bilateral. Muscular strength 5/5 in all groups tested bilateral with the exception of the extensor tendons of the left foot.  Tibialis anterior left is still intact but extensor hallucis longus and digitorum longus are not  intact.  Most likely associated with back trauma or surgery.  Gait: Unassisted, Nonantalgic.    Radiographs:  Radiographs taken do not demonstrate any type of significant osseous abnormalities no acute findings.  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  Motor neuropathy left anterior extensors  Plan: Recommended gabapentin for the discomfort 100 mg at night follow-up with me should this not alleviate his symptoms.     Romaine Maciolek T[address] [place], [place]

## 2019-12-17 ENCOUNTER — Ambulatory Visit (INDEPENDENT_AMBULATORY_CARE_PROVIDER_SITE_OTHER): Payer: Medicare Other | Admitting: Family Medicine

## 2019-12-17 VITALS — BP 104/58 | HR 78 | Ht 73.0 in | Wt 224.0 lb

## 2019-12-17 DIAGNOSIS — M999 Biomechanical lesion, unspecified: Secondary | ICD-10-CM | POA: Diagnosis not present

## 2019-12-17 DIAGNOSIS — M5416 Radiculopathy, lumbar region: Secondary | ICD-10-CM | POA: Diagnosis not present

## 2019-12-17 NOTE — Patient Instructions (Signed)
CoQ10 200mg  to 400mg  daily See me in 5 weeks

## 2019-12-17 NOTE — Progress Notes (Signed)
[place] Phone: [phone] Subjective:   [name], am serving as a scribe for Dr. Hulan Saas. This visit occurred during the SARS-CoV-2 public health emergency.  Safety protocols were in place, including screening questions prior to the visit, additional usage of staff PPE, and extensive cleaning of exam room while observing appropriate contact time as indicated for disinfecting solutions.   I'm seeing this patient by the request  of:  [name], MD  CC: Low back pain follow-up  [id number]  Edwin [name] is a 79 y.o. male coming in with complaint of back pain. Last seen on 11/06/2019 for OMT. Patient states that he has been doing well since last visit. Here for OMT to manage his pain.  Patient feels like he is doing relatively well.  Is continuing the gabapentin intermittently but does state he does not take it on a regular basis.  Patient is wondering if he can increase his co-Q10 because he does feel like that has been one of the most beneficial things.         Past Medical History:  Diagnosis Date  . BPH (benign prostatic hyperplasia)   . Cancer [facility])    Prostate Cancer  . Dysrhythmia    SVT  . Hyperlipidemia   . Hypertension   . PTSD (post-traumatic stress disorder)   . Pulmonary nodules    Past Surgical History:  Procedure Laterality Date  . BACK SURGERY     Laminectomy x 3  . COLONOSCOPY N/A 02/10/2015   Procedure: COLONOSCOPY;  Surgeon: Manya Silvas, MD;  Location: [facility] ENDOSCOPY;  Service: Endoscopy;  Laterality: N/A;  . PROSTATECTOMY    . Surgery for Urethral Stricture     Social History   Socioeconomic History  . Marital status: Married    Spouse name: Not on file  . Number of children: Not on file  . Years of education: Not on file  . Highest education level: Not on file  Occupational History  . Not on file  Tobacco Use  . Smoking status: Former Smoker   Packs/day: 0.70    Years: 5.00    Pack years: 3.50    Types: Cigarettes    Quit date: 07/23/1965    Years since quitting: 54.4  . Smokeless tobacco: Never Used  Substance and Sexual Activity  . Alcohol use: No  . Drug use: No  . Sexual activity: Not on file  Other Topics Concern  . Not on file  Social History Narrative  . Not on file   Social Determinants of Health   Financial Resource Strain:   . Difficulty of Paying Living Expenses:   Food Insecurity:   . Worried About Charity fundraiser in the Last Year:   . Arboriculturist in the Last Year:   Transportation Needs:   . Film/video editor (Medical):   Marland Kitchen Lack of Transportation (Non-Medical):   Physical Activity:   . Days of Exercise per Week:   . Minutes of Exercise per Session:   Stress:   . Feeling of Stress :   Social Connections:   . Frequency of Communication with Friends and Family:   . Frequency of Social Gatherings with Friends and Family:   . Attends Religious Services:   . Active Member of Clubs or Organizations:   . Attends Archivist Meetings:   Marland Kitchen Marital Status:    Allergies  Allergen Reactions  .  Amoxicillin-Pot Clavulanate Diarrhea  . Bacitracin-Neomycin-Polymyxin Hives  . Hydrochlorothiazide   . Lipitor [Atorvastatin]   . Lisinopril   . Neosporin [Neomycin-Bacitracin Zn-Polymyx]    No family history on file.   Current Outpatient Medications (Cardiovascular):  .  valsartan (DIOVAN) 80 MG tablet, Take by mouth.   Current Outpatient Medications (Analgesics):  .  aspirin EC 81 MG tablet, Take 81 mg by mouth daily.   Current Outpatient Medications (Other):  .  bicalutamide (CASODEX) 50 MG tablet, TK 1 T PO QD FOR 7 DAYS PRIOR TO INJECTION OF LEUPROLIDE ACETATE THEN 1 T QD FOR 5 DAYS AFTER .  co-enzyme Q-10 30 MG capsule, Take 30 mg by mouth 3 (three) times daily. Marland Kitchen  gabapentin (NEURONTIN) 100 MG capsule, Take 2 capsules (200 mg total) by mouth at bedtime. .  gabapentin  (NEURONTIN) 100 MG capsule, Take 2 capsules (200 mg total) by mouth at bedtime. .  itraconazole (SPORANOX) 100 MG capsule, Take 100 mg by mouth 2 (two) times daily. Javier Docker Oil 1000 MG CAPS, Take 2 capsules by mouth daily. Marland Kitchen  MAGNESIUM GLUCONATE PO, Take 400 mg by mouth at bedtime. .  Multiple Vitamin (MULTIVITAMIN) tablet, Take 1 tablet by mouth daily. Marland Kitchen  Ubiquinol 50 MG CAPS, Take 1 tablet by mouth. .  Vitamin D, Ergocalciferol, (DRISDOL) 1.25 MG (50000 UNIT) CAPS capsule, Take 1 capsule (50,000 Units total) by mouth every 7 (seven) days.   Reviewed prior external information including notes and imaging from  primary care provider As well as notes that were available from care everywhere and other healthcare systems.  Past medical history, social, surgical and family history all reviewed in electronic medical record.  No pertanent information unless stated regarding to the chief complaint.   Review of Systems:  No headache, visual changes, nausea, vomiting, diarrhea, constipation, dizziness, abdominal pain, skin rash, fevers, chills, night sweats, weight loss, swollen lymph nodes,  joint swelling, chest pain, shortness of breath, mood changes. POSITIVE muscle aches, body aches  Objective  Blood pressure (!) 104/58, pulse 78, height 6\' 1"  (1.854 m), weight 224 lb (101.6 kg), SpO2 96 %.   General: No apparent distress alert and oriented x3 mood and affect normal, dressed appropriately.  HEENT: Pupils equal, extraocular movements intact  Respiratory: Patient's speak in full sentences and does not appear short of breath  Cardiovascular: No lower extremity edema, non tender, no erythema  Neuro: Cranial nerves II through XII are intact, neurovascularly intact in all extremities with 2+ DTRs and 2+ pulses.  Gait normal with good balance and coordination.  MSK:  Non tender with full range of motion and good stability and symmetric strength and tone of shoulders, elbows, wrist, hip, knee and  ankles bilaterally.  Back exam significant loss of lordosis.  Patient does have limited range of motion in all planes of 5 to 10 degrees.  Tightness noted in FABER test bilaterally.  Tightness with straight leg test as well.  More tenderness in the thoracolumbar juncture today than patient's baseline  Osteopathic findings C3 flexed rotated and side bent left T9 extended rotated and side bent left inhaled rib L2 flexed rotated and side bent right Sacrum right on right    Impression and Recommendations:     This case required medical decision making of moderate complexity. The above documentation has been reviewed and is accurate and complete Edwin Pulley, DO       Note: This dictation was prepared with Dragon dictation along with smaller phrase technology.  Any transcriptional errors that result from this process are unintentional.

## 2019-12-18 ENCOUNTER — Encounter: Payer: Self-pay | Admitting: Family Medicine

## 2019-12-18 NOTE — Assessment & Plan Note (Signed)
Chronic problem but is doing relatively well.  Patient's nonallopathic problems are also chronic and stable.  Discussed medication management including the gabapentin.  Discussed home exercises and icing regimen.  Discussed which activities to do which wants to avoid.  Refilled vitamin D supplementation.  Follow-up again in 6 weeks

## 2019-12-18 NOTE — Assessment & Plan Note (Signed)

## 2020-01-21 ENCOUNTER — Other Ambulatory Visit: Payer: Self-pay

## 2020-01-21 ENCOUNTER — Encounter: Payer: Self-pay | Admitting: Family Medicine

## 2020-01-21 ENCOUNTER — Ambulatory Visit: Payer: Medicare Other | Admitting: Family Medicine

## 2020-01-21 VITALS — BP 108/60 | HR 69 | Ht 73.0 in | Wt 223.0 lb

## 2020-01-21 DIAGNOSIS — M5416 Radiculopathy, lumbar region: Secondary | ICD-10-CM

## 2020-01-21 DIAGNOSIS — M999 Biomechanical lesion, unspecified: Secondary | ICD-10-CM | POA: Diagnosis not present

## 2020-01-21 NOTE — Progress Notes (Signed)
[place] Phone: [phone] Subjective:   [name], am serving as a scribe for Dr. Hulan Saas. This visit occurred during the SARS-CoV-2 public health emergency.  Safety protocols were in place, including screening questions prior to the visit, additional usage of staff PPE, and extensive cleaning of exam room while observing appropriate contact time as indicated for disinfecting solutions.   I'm seeing this patient by the request  of:  Edwin Body, MD  CC: Back pain follow-up  [id number]  Edwin [name] is a 79 y.o. male coming in with complaint of back and neck pain. OMT 12/17/2019. Patient states he has not had any changes in his pain since last visit.   Medications patient has been prescribed: Gabapentin but taking it intermittently          Reviewed prior external information including notes and imaging from previsou exam, outside providers and external EMR if available.   As well as notes that were available from care everywhere and other healthcare systems.  Past medical history, social, surgical and family history all reviewed in electronic medical record.  No pertanent information unless stated regarding to the chief complaint.   Past Medical History:  Diagnosis Date  . BPH (benign prostatic hyperplasia)   . Cancer [facility])    Prostate Cancer  . Dysrhythmia    SVT  . Hyperlipidemia   . Hypertension   . PTSD (post-traumatic stress disorder)   . Pulmonary nodules     Allergies  Allergen Reactions  . Amoxicillin-Pot Clavulanate Diarrhea  . Bacitracin-Neomycin-Polymyxin Hives  . Hydrochlorothiazide   . Lipitor [Atorvastatin]   . Lisinopril   . Neosporin [Neomycin-Bacitracin Zn-Polymyx]      Review of Systems:  No headache, visual changes, nausea, vomiting, diarrhea, constipation, dizziness, abdominal pain, skin rash, fevers, chills, night sweats, weight loss, swollen  lymph nodes, [name] aches, joint swelling, chest pain, shortness of breath, mood changes. POSITIVE muscle aches  Objective  Blood pressure 108/60, pulse 69, height 6\' 1"  (1.854 m), weight 223 lb (101.2 kg), SpO2 98 %.   General: No apparent distress alert and oriented x3 mood and affect normal, dressed appropriately.  HEENT: Pupils equal, extraocular movements intact  Respiratory: Patient's speak in full sentences and does not appear short of breath  Cardiovascular: No lower extremity edema, non tender, no erythema  Neuro: Cranial nerves II through XII are intact, neurovascularly intact in all extremities with 2+ DTRs and 2+ pulses.  Gait normal with good balance and coordination.  MSK: Arthritic changes of multiple joints Back exam loss of lordosis, tender to palpation of the paraspinal musculature lumbar spine right greater than left.  Patient does have significant tightness with straight leg test and FABER test bilaterally.  Limited side bending of the back bilaterally as well.  Strength of the lower extremities oh 5 out of 5.  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  Encourage the gabapentin and can increase to 300 mg.  Discussed which activities to do which wants to avoid.  Responding fairly well to osteopathic manipulation.  Low threshold for advanced imaging with history of malignant prostate.  Patient though overall does not have any other systemic findings that would make  me change medical management at this time.  Follow-up again in 4 to 8 weeks   Nonallopathic problems  Decision today to treat with OMT was based on Physical Exam  After verbal consent patient was treated with HVLA, ME, FPR techniques in cervical, rib, thoracic, lumbar, and  sacral  areas  Patient tolerated the procedure well with improvement in symptoms  Patient given exercises, stretches and lifestyle modifications  See medications in patient instructions if given  Patient will follow up in 4-8 weeks      The above documentation has been reviewed and is accurate and complete [name], DO       Note: This dictation was prepared with Dragon dictation along with smaller phrase technology. Any transcriptional errors that result from this process are unintentional.

## 2020-01-21 NOTE — Patient Instructions (Signed)
See me again in 6-7 weeks ?

## 2020-02-12 ENCOUNTER — Encounter: Payer: Self-pay | Admitting: Family Medicine

## 2020-02-12 ENCOUNTER — Other Ambulatory Visit: Payer: Self-pay

## 2020-02-12 ENCOUNTER — Ambulatory Visit (INDEPENDENT_AMBULATORY_CARE_PROVIDER_SITE_OTHER): Payer: Medicare Other | Admitting: Family Medicine

## 2020-02-12 DIAGNOSIS — M5416 Radiculopathy, lumbar region: Secondary | ICD-10-CM | POA: Diagnosis not present

## 2020-02-12 MED ORDER — KETOROLAC TROMETHAMINE 30 MG/ML IJ SOLN
30.0000 mg | Freq: Once | INTRAMUSCULAR | Status: AC
  Administered 2020-02-12: 30 mg via INTRAMUSCULAR

## 2020-02-12 MED ORDER — METHYLPREDNISOLONE ACETATE 40 MG/ML IJ SUSP
40.0000 mg | Freq: Once | INTRAMUSCULAR | Status: AC
  Administered 2020-02-12: 40 mg via INTRAMUSCULAR

## 2020-02-12 NOTE — Progress Notes (Signed)
[place] Phone: [phone] Subjective:    I'm seeing this patient by the request  of:  [name], MD  CC: Back pain follow-up  [id number]  Edwin [name] is a 79 y.o. male coming in with complaint of patient is having worsening back pain.  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  Patient states it is more soreness.  Has been sitting more and having to do more work that has been longer in duration.      Past Medical History:  Diagnosis Date  . BPH (benign prostatic hyperplasia)   . Cancer [facility])    Prostate Cancer  . Dysrhythmia    SVT  . Hyperlipidemia   . Hypertension   . PTSD (post-traumatic stress disorder)   . Pulmonary nodules    Past Surgical History:  Procedure Laterality Date  . BACK SURGERY     Laminectomy x 3  . COLONOSCOPY N/A 02/10/2015   Procedure: COLONOSCOPY;  Surgeon: Manya Silvas, MD;  Location: [facility] ENDOSCOPY;  Service: Endoscopy;  Laterality: N/A;  . PROSTATECTOMY    . Surgery for Urethral Stricture     Social History   Socioeconomic History  . Marital status: Married    Spouse name: Not on file  . Number of children: Not on file  . Years of education: Not on file  . Highest education level: Not on file  Occupational History  . Not on file  Tobacco Use  . Smoking status: Former Smoker    Packs/day: 0.70    Years: 5.00    Pack years: 3.50    Types: Cigarettes    Quit date: 07/23/1965    Years since quitting: 54.5  . Smokeless tobacco: Never Used  Substance and Sexual Activity  . Alcohol use: No  . Drug use: No  . Sexual activity: Not on file  Other Topics Concern  . Not on file  Social History Narrative  . Not on file   Social Determinants of Health   Financial Resource Strain:   . Difficulty of Paying Living Expenses:   Food Insecurity:   . Worried About Charity fundraiser in the Last Year:   . Arboriculturist in the  Last Year:   Transportation Needs:   . Film/video editor (Medical):   Marland Kitchen Lack of Transportation (Non-Medical):   Physical Activity:   . Days of Exercise per Week:   . Minutes of Exercise per Session:   Stress:   . Feeling of Stress :   Social Connections:   . Frequency of Communication with Friends and Family:   . Frequency of Social Gatherings with Friends and Family:   . Attends Religious Services:   . Active Member of Clubs or Organizations:   . Attends Archivist Meetings:   Marland Kitchen Marital Status:    Allergies  Allergen Reactions  . Amoxicillin-Pot Clavulanate Diarrhea  . Bacitracin-Neomycin-Polymyxin Hives  . Hydrochlorothiazide   . Lipitor [Atorvastatin]   . Lisinopril   . Neosporin [Neomycin-Bacitracin Zn-Polymyx]    No family history on file.   Current Outpatient Medications (Cardiovascular):  .  valsartan (DIOVAN) 80 MG tablet, Take by mouth.   Current Outpatient Medications (Analgesics):  .  aspirin EC 81 MG tablet, Take 81 mg by mouth daily.   Current Outpatient Medications (Other):  .  bicalutamide (CASODEX) 50 MG tablet, TK 1 T PO QD FOR 7 DAYS PRIOR TO  INJECTION OF LEUPROLIDE ACETATE THEN 1 T QD FOR 5 DAYS AFTER .  co-enzyme Q-10 30 MG capsule, Take 30 mg by mouth 3 (three) times daily. Marland Kitchen  gabapentin (NEURONTIN) 100 MG capsule, Take 2 capsules (200 mg total) by mouth at bedtime. .  gabapentin (NEURONTIN) 100 MG capsule, Take 2 capsules (200 mg total) by mouth at bedtime. .  itraconazole (SPORANOX) 100 MG capsule, Take 100 mg by mouth 2 (two) times daily. Javier Docker Oil 1000 MG CAPS, Take 2 capsules by mouth daily. Marland Kitchen  MAGNESIUM GLUCONATE PO, Take 400 mg by mouth at bedtime. .  Multiple Vitamin (MULTIVITAMIN) tablet, Take 1 tablet by mouth daily. Marland Kitchen  Ubiquinol 50 MG CAPS, Take 1 tablet by mouth. .  Vitamin D, Ergocalciferol, (DRISDOL) 1.25 MG (50000 UNIT) CAPS capsule, Take 1 capsule (50,000 Units total) by mouth every 7 (seven) days.   Reviewed  prior external information including notes and imaging from  primary care provider As well as notes that were available from care everywhere and other healthcare systems.  Past medical history, social, surgical and family history all reviewed in electronic medical record.  No pertanent information unless stated regarding to the chief complaint.   Review of Systems:  No headache, visual changes, nausea, vomiting, diarrhea, constipation, dizziness, abdominal pain, skin rash, fevers, chills, night sweats, weight loss, swollen lymph nodes, body aches, joint swelling, chest pain, shortness of breath, mood changes. POSITIVE muscle aches  Objective  There were no vitals taken for this visit.   General: No apparent distress alert and oriented x3 mood and affect normal, dressed appropriately.  HEENT: Pupils equal, extraocular movements intact  Gait normal with good balance and coordination.  MSK: Arthritic changes. Back exam does have tightness.  Patient has difficulty getting up from a sitting position Patient distinctly have more stiffness.   Impression and Recommendations:     The above documentation has been reviewed and is accurate and complete [name], DO       Note: This dictation was prepared with Dragon dictation along with smaller phrase technology. Any transcriptional errors that result from this process are unintentional.

## 2020-02-12 NOTE — Assessment & Plan Note (Signed)
History of lumbar radiculopathy.  Worsening symptoms at the moment.  Just wanted more of a Toradol and Depo-Medrol today.  We discussed the medications including the gabapentin with no significant changes but to continue it.  Follow-up with me again as scheduled in the next 3 weeks

## 2020-03-03 ENCOUNTER — Telehealth: Payer: Self-pay | Admitting: Family Medicine

## 2020-03-03 NOTE — Telephone Encounter (Signed)
Edwin [name] called. They have appt with Edwin [name] on 8/19. As part of Edwin [name] hormone therapy from [facility] he needs to be injected with Eligard.  He picks up the med at the pharmacy, it requires mixing and an injection in the stomach.  Julieane calling to see if Dr. Tamala [name] (or someone on our staff) would be comfortable giving this injection during his visit next week. She is perfectly comfortable with [place] saying no, but she wanted to ask.  Phone [phone]

## 2020-03-04 NOTE — Telephone Encounter (Signed)
Potential legal or liability issues I think we have to decline

## 2020-03-07 NOTE — Telephone Encounter (Signed)
Patient's partner notified. Voices understanding.

## 2020-03-10 ENCOUNTER — Encounter: Payer: Self-pay | Admitting: Family Medicine

## 2020-03-10 ENCOUNTER — Other Ambulatory Visit: Payer: Self-pay

## 2020-03-10 ENCOUNTER — Ambulatory Visit: Payer: Medicare Other | Admitting: Family Medicine

## 2020-03-10 VITALS — BP 136/80 | HR 77 | Ht 73.0 in | Wt 225.0 lb

## 2020-03-10 DIAGNOSIS — M999 Biomechanical lesion, unspecified: Secondary | ICD-10-CM

## 2020-03-10 DIAGNOSIS — M5416 Radiculopathy, lumbar region: Secondary | ICD-10-CM | POA: Diagnosis not present

## 2020-03-10 NOTE — Assessment & Plan Note (Signed)
Patient has made good strides at this time.  Continues to stay active.  Doing much more with working out again.  Encouraged him to continue to do so.  Discussed icing regimen, discussed topical anti-inflammatories discussed vitamin supplementations.  Follow-up again in 4 to 8 weeks.

## 2020-03-10 NOTE — Progress Notes (Signed)
[place] [address] [place] Phone: [phone] Subjective:   I Edwin [name] am serving as a Education administrator for Dr. Hulan [name].  This visit occurred during the SARS-CoV-2 public health emergency.  Safety protocols were in place, including screening questions prior to the visit, additional usage of staff PPE, and extensive cleaning of exam room while observing appropriate contact time as indicated for disinfecting solutions.   I'm seeing this patient by the request  of:  Edwin Body, MD  CC: Low back pain follow-up  [id number]  Edwin [name] is a 79 y.o. male coming in with complaint of back and neck pain. OMT 02/12/2020. Patient states he is doing great.  Patient has been riding bike on a more regular basis.  Overall has been doing relatively well  Taking: Gabapentin regularly         Reviewed prior external information including notes and imaging from previsou exam, outside providers and external EMR if available.   As well as notes that were available from care everywhere and other healthcare systems.  Past medical history, social, surgical and family history all reviewed in electronic medical record.  No pertanent information unless stated regarding to the chief complaint.   Past Medical History:  Diagnosis Date  . BPH (benign prostatic hyperplasia)   . Cancer [facility])    Prostate Cancer  . Dysrhythmia    SVT  . Hyperlipidemia   . Hypertension   . PTSD (post-traumatic stress disorder)   . Pulmonary nodules     Allergies  Allergen Reactions  . Amoxicillin-Pot Clavulanate Diarrhea  . Bacitracin-Neomycin-Polymyxin Hives  . Hydrochlorothiazide   . Lipitor [Atorvastatin]   . Lisinopril   . Neosporin [Neomycin-Bacitracin Zn-Polymyx]      Review of Systems:  No headache, visual changes, nausea, vomiting, diarrhea, constipation, dizziness, abdominal pain, skin rash, fevers, chills, night sweats, weight loss,  swollen lymph nodes, [name] aches, joint swelling, chest pain, shortness of breath, mood changes. POSITIVE muscle aches  Objective  There were no vitals taken for this visit.   General: No apparent distress alert and oriented x3 mood and affect normal, dressed appropriately.  HEENT: Pupils equal, extraocular movements intact  Respiratory: Patient's speak in full sentences and does not appear short of breath  Cardiovascular: No lower extremity edema, non tender, no erythema  Neuro: Cranial nerves II through XII are intact, neurovascularly intact in all extremities with 2+ DTRs and 2+ pulses.  Gait normal with good balance and coordination.  MSK:  Non tender with full range of motion and good stability and symmetric strength and tone of shoulders, elbows, wrist, hip, knee and ankles bilaterally.  Back -arthritic changes of multiple joints.  Tender to palpation in the paraspinal musculature lumbar spine right greater than left.  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  Some tightness with FABER test bilaterally s  Osteopathic findings  C6 flexed rotated and side bent left T3 extended rotated and side bent right inhaled rib T8 extended rotated and side bent left L4 flexed rotated and side bent left Sacrum right on right       Assessment and Plan:  Lumbar radiculopathy Patient has made good strides at this time.  Continues to stay active.  Doing much more with working out again.  Encouraged him to continue to do so.  Discussed icing regimen, discussed topical anti-inflammatories discussed vitamin supplementations.  Follow-up again in 4 to 8 weeks.    Nonallopathic problems  Decision today  to treat with OMT was based on Physical Exam  After verbal consent patient was treated with HVLA, ME, FPR techniques in cervical, rib, thoracic, lumbar, and sacral  areas  Patient tolerated the procedure well with improvement in symptoms  Patient given exercises, stretches and  lifestyle modifications  See medications in patient instructions if given  Patient will follow up in 4-8 weeks      The above documentation has been reviewed and is accurate and complete Edwin Pulley, DO       Note: This dictation was prepared with Dragon dictation along with smaller phrase technology. Any transcriptional errors that result from this process are unintentional.

## 2020-04-28 ENCOUNTER — Ambulatory Visit: Payer: Medicare Other | Admitting: Family Medicine

## 2020-04-28 ENCOUNTER — Other Ambulatory Visit: Payer: Self-pay

## 2020-04-28 ENCOUNTER — Encounter: Payer: Self-pay | Admitting: Family Medicine

## 2020-04-28 VITALS — BP 120/72 | HR 62 | Ht 73.0 in | Wt 222.0 lb

## 2020-04-28 DIAGNOSIS — M9982 Other biomechanical lesions of thoracic region: Secondary | ICD-10-CM

## 2020-04-28 DIAGNOSIS — M5416 Radiculopathy, lumbar region: Secondary | ICD-10-CM

## 2020-04-28 DIAGNOSIS — M9984 Other biomechanical lesions of sacral region: Secondary | ICD-10-CM

## 2020-04-28 DIAGNOSIS — M9981 Other biomechanical lesions of cervical region: Secondary | ICD-10-CM

## 2020-04-28 DIAGNOSIS — M9983 Other biomechanical lesions of lumbar region: Secondary | ICD-10-CM

## 2020-04-28 DIAGNOSIS — M999 Biomechanical lesion, unspecified: Secondary | ICD-10-CM | POA: Diagnosis not present

## 2020-04-28 DIAGNOSIS — M9988 Other biomechanical lesions of rib cage: Secondary | ICD-10-CM

## 2020-04-28 MED ORDER — VITAMIN D (ERGOCALCIFEROL) 1.25 MG (50000 UNIT) PO CAPS: 50000.0000 [IU] | ORAL_CAPSULE | ORAL | 0 refills | Status: DC

## 2020-04-28 NOTE — Progress Notes (Signed)
[place] Phone: [phone] Subjective:   [name], am serving as a scribe for Dr. Hulan Saas. This visit occurred during the SARS-CoV-2 public health emergency.  Safety protocols were in place, including screening questions prior to the visit, additional usage of staff PPE, and extensive cleaning of exam room while observing appropriate contact time as indicated for disinfecting solutions.   I'm seeing this patient by the request  of:  [name], MD  CC: Low back pain follow-up  [id number]  Edwin [name] is a 79 y.o. male coming in with complaint of back and neck pain. OMT 03/10/2020. Patient states overall doing relatively well.  Still having more trouble with fatigue from time to time.  Patient has not noticed any significant fluid or anything else like that.  Low back once again past medical history is significant for 3 previous surgeries and history of prostate cancer but no radicular symptoms.  Doing well with the medication  Medications patient has been prescribed: Gabapentin, Vit D  Taking: Yes         Reviewed prior external information including notes and imaging from previsou exam, outside providers and external EMR if available.   As well as notes that were available from care everywhere and other healthcare systems.  Past medical history, social, surgical and family history all reviewed in electronic medical record.  No pertanent information unless stated regarding to the chief complaint.   Past Medical History:  Diagnosis Date  . BPH (benign prostatic hyperplasia)   . Cancer [facility])    Prostate Cancer  . Dysrhythmia    SVT  . Hyperlipidemia   . Hypertension   . PTSD (post-traumatic stress disorder)   . Pulmonary nodules     Allergies  Allergen Reactions  . Amoxicillin-Pot Clavulanate Diarrhea  . Bacitracin-Neomycin-Polymyxin Hives  . Hydrochlorothiazide   .  Lipitor [Atorvastatin]   . Lisinopril   . Neosporin [Neomycin-Bacitracin Zn-Polymyx]      Review of Systems:  No headache, visual changes, nausea, vomiting, diarrhea, constipation, dizziness, abdominal pain, skin rash, fevers, chills, night sweats, weight loss, swollen lymph nodes, body aches, joint swelling, chest pain, shortness of breath, mood changes. POSITIVE muscle aches  Objective  There were no vitals taken for this visit.   General: No apparent distress alert and oriented x3 mood and affect normal, dressed appropriately.  HEENT: Pupils equal, extraocular movements intact  Respiratory: Patient's speak in full sentences and does not appear short of breath  Cardiovascular: No lower extremity edema, non tender, no erythema  Neuro: Cranial nerves II through XII are intact, neurovascularly intact in all extremities with 2+ DTRs and 2+ pulses.  Gait normal with good balance and coordination.  MSK:  Non tender with full range of motion and good stability and symmetric strength and tone of shoulders, elbows, wrist, hip, knee and ankles bilaterally.  Back -significant arthritic changes of the back.  Patient does have some loss of lordosis and some degenerative scoliosis.  Significant tightness with the [facility] bilaterally as well as significant tightness of the hamstrings with straight leg test but no radicular symptoms.  Lacks last 10 degrees of extension  Osteopathic findings   C6 flexed rotated and side bent left T3 extended rotated and side bent right inhaled rib T8 extended rotated and side bent left L2 flexed rotated and side bent right Sacrum right on right       Assessment and Plan:   Lumbar  radiculopathy Patient likely has not had any more lumbar radiculopathy.  No signs of any fevers, chills, any abnormal weight loss.  The patient has been doing well.  We will continue to monitor and if any of these findings I would consider advanced imaging secondary to history of  prostate cancer.  Patient has remained very active otherwise.  Patient will see me again in 4 to 8 weeks.  Encouraged to continue the gabapentin   Nonallopathic problems  Decision today to treat with OMT was based on Physical Exam  After verbal consent patient was treated with HVLA, ME ttechniques in cervical, rib, thoracic, lumbar, and sacral  areas  Patient tolerated the procedure well with improvement in symptoms  Patient given exercises, stretches and lifestyle modifications  See medications in patient instructions if given  Patient will follow up in 4-8 weeks      The above documentation has been reviewed and is accurate and complete [name], DO       Note: This dictation was prepared with Dragon dictation along with smaller phrase technology. Any transcriptional errors that result from this process are unintentional.

## 2020-04-28 NOTE — Patient Instructions (Signed)
Once weekly Vit D See me in 6-7 weeks

## 2020-04-28 NOTE — Assessment & Plan Note (Signed)
Patient likely has not had any more lumbar radiculopathy.  No signs of any fevers, chills, any abnormal weight loss.  The patient has been doing well.  We will continue to monitor and if any of these findings I would consider advanced imaging secondary to history of prostate cancer.  Patient has remained very active otherwise.  Patient will see me again in 4 to 8 weeks.  Encouraged to continue the gabapentin

## 2020-05-05 ENCOUNTER — Telehealth: Payer: Self-pay | Admitting: Family Medicine

## 2020-05-05 NOTE — Telephone Encounter (Signed)
Yes per his last labs creatinine normal  Just make sure he is not on any blood thinner I do not know about.  Would take 1 pill 2 times a day for 3 days. Do not take any over the counter NSAIDs while on it   I called patient and told him and significant

## 2020-05-05 NOTE — Telephone Encounter (Signed)
Edwin [name] called [phone] ), they have some duexis samples from [place] and wanted to know if it was OK for Edwin [name] to take.

## 2020-06-09 ENCOUNTER — Encounter: Payer: Self-pay | Admitting: Family Medicine

## 2020-06-09 ENCOUNTER — Ambulatory Visit: Payer: Medicare Other | Admitting: Family Medicine

## 2020-06-09 ENCOUNTER — Other Ambulatory Visit: Payer: Self-pay

## 2020-06-09 VITALS — BP 122/72 | HR 66 | Ht 73.0 in | Wt 226.0 lb

## 2020-06-09 DIAGNOSIS — M9982 Other biomechanical lesions of thoracic region: Secondary | ICD-10-CM

## 2020-06-09 DIAGNOSIS — M9983 Other biomechanical lesions of lumbar region: Secondary | ICD-10-CM

## 2020-06-09 DIAGNOSIS — M9981 Other biomechanical lesions of cervical region: Secondary | ICD-10-CM | POA: Diagnosis not present

## 2020-06-09 DIAGNOSIS — M216X9 Other acquired deformities of unspecified foot: Secondary | ICD-10-CM

## 2020-06-09 DIAGNOSIS — M9984 Other biomechanical lesions of sacral region: Secondary | ICD-10-CM

## 2020-06-09 DIAGNOSIS — M9988 Other biomechanical lesions of rib cage: Secondary | ICD-10-CM

## 2020-06-09 DIAGNOSIS — M5416 Radiculopathy, lumbar region: Secondary | ICD-10-CM

## 2020-06-09 DIAGNOSIS — M999 Biomechanical lesion, unspecified: Secondary | ICD-10-CM

## 2020-06-09 NOTE — Assessment & Plan Note (Signed)
Discussed with patient about over-the-counter orthotics again.  Discussed with patient positioning with the biking.  Follow-up again in 6 weeks

## 2020-06-09 NOTE — Progress Notes (Signed)
[place] Phone: [phone] Subjective:   Edwin [name], am serving as a scribe for Dr. Hulan Saas. This visit occurred during the SARS-CoV-2 public health emergency.  Safety protocols were in place, including screening questions prior to the visit, additional usage of staff PPE, and extensive cleaning of exam room while observing appropriate contact time as indicated for disinfecting solutions.   I'm seeing this patient by the request  of:  [name], MD  CC: Low back pain follow-up  [id number]  Edwin [name] is a 79 y.o. male coming in with complaint of back and neck pain. OMT 04/28/2020. Patient states that he needs an adjustment.  Patient has had known arthritic changes of the back.  Patient describes the pain as a dull, throbbing aching sensation.  Still able to ride his bike on a fairly regular basis.  Does have ankle stiffness due to wesather changes nothing severe but has noticed that a little bit more.           Reviewed prior external information including notes and imaging from previsou exam, outside providers and external EMR if available.   As well as notes that were available from care everywhere and other healthcare systems.  Past medical history, social, surgical and family history all reviewed in electronic medical record.  [name] pertanent information unless stated regarding to the chief complaint.   Past Medical History:  Diagnosis Date  . BPH (benign prostatic hyperplasia)   . Cancer [facility])    Prostate Cancer  . Dysrhythmia    SVT  . Hyperlipidemia   . Hypertension   . PTSD (post-traumatic stress disorder)   . Pulmonary nodules     Allergies  Allergen Reactions  . Amoxicillin-Pot Clavulanate Diarrhea  . Bacitracin-Neomycin-Polymyxin Hives  . Hydrochlorothiazide   . Lipitor [Atorvastatin]   . Lisinopril   . Neosporin [Neomycin-Bacitracin Zn-Polymyx]      Review  of Systems:  [name] headache, visual changes, nausea, vomiting, diarrhea, constipation, dizziness, abdominal pain, skin rash, fevers, chills, night sweats, weight loss, swollen lymph nodes, body aches, joint swelling, chest pain, shortness of breath, mood changes. POSITIVE muscle aches  Objective  Blood pressure 122/72, pulse 66, height 6\' 1"  (1.854 m), weight 226 lb (102.5 kg), SpO2 95 %.   General: [name] apparent distress alert and oriented x3 mood and affect normal, dressed appropriately.  HEENT: Pupils equal, extraocular movements intact  Respiratory: Patient's speak in full sentences and does not appear short of breath  Cardiovascular: [name] lower extremity edema, non tender, [name] erythema  Gait normal with good balance and coordination.  Arthritic changes of multiple joints.  Patient has some loss of lordosis.  Tenderness to palpation in the paraspinal musculature mostly in the thoracolumbar juncture as well as somewhat in the sacroiliac joint.  Ankle exam shows the patient does have some pronation of the ankles bilaterally.  Tightness noted of the peroneal tendon right greater than left.  Osteopathic findings  C7 flexed rotated and side bent left T3 extended rotated and side bent left inhaled rib T9 extended rotated and side bent left L2 flexed rotated and side bent right Sacrum right on right       Assessment and Plan:  Pronation deformity of ankle, acquired Discussed with patient about over-the-counter orthotics again.  Discussed with patient positioning with the biking.  Follow-up again in 6 weeks  Lumbar radiculopathy As the gabapentin at this time.  Patient did have a patient  with a chronic problem.  Mild increase in tightness follow-up again in 4 to 8 weeks    Nonallopathic problems  Decision today to treat with OMT was based on Physical Exam  After verbal consent patient was treated with HVLA, ME, FPR techniques in cervical, rib, thoracic, lumbar, and sacral   areas  Patient tolerated the procedure well with improvement in symptoms  Patient given exercises, stretches and lifestyle modifications  See medications in patient instructions if given  Patient will follow up in 4-8 weeks      The above documentation has been reviewed and is accurate and complete [name], DO       Note: This dictation was prepared with Dragon dictation along with smaller phrase technology. Any transcriptional errors that result from this process are unintentional.

## 2020-06-09 NOTE — Patient Instructions (Signed)
Change the clip on your shoe on your bike See me again in 6-7 weeks

## 2020-06-09 NOTE — Assessment & Plan Note (Signed)
As the gabapentin at this time.  Patient did have a patient with a chronic problem.  Mild increase in tightness follow-up again in 4 to 8 weeks

## 2020-07-13 NOTE — Progress Notes (Signed)
[place] [address] [place] Phone: [phone] Subjective:   I [name] am serving as a Education administrator for Dr. Hulan Saas.  This visit occurred during the SARS-CoV-2 public health emergency.  Safety protocols were in place, including screening questions prior to the visit, additional usage of staff PPE, and extensive cleaning of exam room while observing appropriate contact time as indicated for disinfecting solutions.   I'm seeing this patient by the request  of:  [name], MD  CC: Low back pain follow-up  [id number]  Edwin [name] is a 79 y.o. male coming in with complaint of back and neck pain. OMT 06/09/2020. Patient states his hip flexors are causing problems.  Patient has been growing little bit more.  Has noticed some tightness.  No significant radicular symptoms patient denies any fevers or chills or any abnormal weight loss.  Continues to be active  Medications patient has been prescribed: Gabapentin, Vit D  Taking: Yes         Reviewed prior external information including notes and imaging from previsou exam, outside providers and external EMR if available.   As well as notes that were available from care everywhere and other healthcare systems.  Past medical history, social, surgical and family history all reviewed in electronic medical record.  No pertanent information unless stated regarding to the chief complaint.   Past Medical History:  Diagnosis Date  . BPH (benign prostatic hyperplasia)   . Cancer [facility])    Prostate Cancer  . Dysrhythmia    SVT  . Hyperlipidemia   . Hypertension   . PTSD (post-traumatic stress disorder)   . Pulmonary nodules     Allergies  Allergen Reactions  . Amoxicillin-Pot Clavulanate Diarrhea  . Bacitracin-Neomycin-Polymyxin Hives  . Hydrochlorothiazide   . Lipitor [Atorvastatin]   . Lisinopril   . Neosporin [Neomycin-Bacitracin Zn-Polymyx]      Review of  Systems:  No headache, visual changes, nausea, vomiting, diarrhea, constipation, dizziness, abdominal pain, skin rash, fevers, chills, night sweats, weight loss, swollen lymph nodes, body aches, joint swelling, chest pain, shortness of breath, mood changes. POSITIVE muscle aches  Objective  Blood pressure 140/70, pulse 61, height 6\' 1"  (1.854 m), weight 237 lb (107.5 kg), SpO2 98 %.   General: No apparent distress alert and oriented x3 mood and affect normal, dressed appropriately.  HEENT: Pupils equal, extraocular movements intact  Respiratory: Patient's speak in full sentences and does not appear short of breath  Cardiovascular: No lower extremity edema, non tender, no erythema  Neuro: Cranial nerves II through XII are intact, neurovascularly intact in all extremities with 2+ DTRs and 2+ pulses.  Gait normal with good balance and coordination.  MSK:  Non tender with full range of motion and good stability and symmetric strength and tone of shoulders, elbows, wrist, hip, knee and ankles bilaterally.  Back -mild loss of lordosis.  Patient does have some degenerative scoliosis noted.  Significant tightness with straight leg and FABER test but no radicular symptoms.  Tightness noted in the paraspinal musculature of the lumbar spine.  Severe tightness of the hip flexors bilaterally  Osteopathic findings  C6 flexed rotated and side bent left T6 extended rotated and side bent right inhaled rib L3 flexed rotated and side bent right Sacrum right on right       Assessment and Plan:  Lumbar radiculopathy Had the radicular symptoms previously but doing better with the gabapentin.  Significant tightness noted though of the  hip flexors a little more than usual.  Patient has been doing a different exercise routine and likely contributing.  Discussed posture and ergonomics.  Discussed different stretching after doing these exercises.  Follow-up with me again 6 weeks    Nonallopathic  problems  Decision today to treat with OMT was based on Physical Exam  After verbal consent patient was treated with HVLA, ME, FPR techniques in cervical, rib, thoracic, lumbar, and sacral  areas  Patient tolerated the procedure well with improvement in symptoms  Patient given exercises, stretches and lifestyle modifications  See medications in patient instructions if given  Patient will follow up in 6 weeks      The above documentation has been reviewed and is accurate and complete [name], DO       Note: This dictation was prepared with Dragon dictation along with smaller phrase technology. Any transcriptional errors that result from this process are unintentional.

## 2020-07-14 ENCOUNTER — Encounter: Payer: Self-pay | Admitting: Family Medicine

## 2020-07-14 ENCOUNTER — Other Ambulatory Visit: Payer: Self-pay

## 2020-07-14 ENCOUNTER — Ambulatory Visit: Payer: Medicare Other | Admitting: Family Medicine

## 2020-07-14 VITALS — BP 140/70 | HR 61 | Ht 73.0 in | Wt 237.0 lb

## 2020-07-14 DIAGNOSIS — M999 Biomechanical lesion, unspecified: Secondary | ICD-10-CM | POA: Diagnosis not present

## 2020-07-14 DIAGNOSIS — M5416 Radiculopathy, lumbar region: Secondary | ICD-10-CM | POA: Diagnosis not present

## 2020-07-14 NOTE — Assessment & Plan Note (Signed)
Had the radicular symptoms previously but doing better with the gabapentin.  Significant tightness noted though of the hip flexors a little more than usual.  Patient has been doing a different exercise routine and likely contributing.  Discussed posture and ergonomics.  Discussed different stretching after doing these exercises.  Follow-up with me again 6 weeks

## 2020-07-14 NOTE — Patient Instructions (Signed)
Good to see you See me again in  

## 2020-08-25 ENCOUNTER — Ambulatory Visit: Payer: Medicare Other | Admitting: Family Medicine

## 2020-08-30 ENCOUNTER — Ambulatory Visit: Payer: Medicare Other | Admitting: Family Medicine

## 2020-10-06 ENCOUNTER — Ambulatory Visit (INDEPENDENT_AMBULATORY_CARE_PROVIDER_SITE_OTHER): Payer: Medicare Other

## 2020-10-06 ENCOUNTER — Other Ambulatory Visit: Payer: Self-pay

## 2020-10-06 ENCOUNTER — Encounter: Payer: Self-pay | Admitting: Family Medicine

## 2020-10-06 ENCOUNTER — Ambulatory Visit: Payer: Medicare Other | Admitting: Family Medicine

## 2020-10-06 VITALS — BP 124/76 | HR 76 | Ht 73.0 in

## 2020-10-06 DIAGNOSIS — M5416 Radiculopathy, lumbar region: Secondary | ICD-10-CM

## 2020-10-06 DIAGNOSIS — M999 Biomechanical lesion, unspecified: Secondary | ICD-10-CM

## 2020-10-06 NOTE — Assessment & Plan Note (Signed)
Patient is having worsening lumbar radiculopathy. Also concern for more of a spinal stenosis.  Patient has had CT scans as well as the body scan showing the patient does have degenerative changes mostly at the L4-L5 that is likely contributing and is consistent with patient's symptoms.  At this point I would like to consider the epidural at L4-L5.  Patient makes improvement that would be very helpful.  If not I would encourage patient consider possible MRI.  Patient was unable to tolerate the gabapentin.  May need to consider Cymbalta.  Follow-up again in 6 weeks

## 2020-10-06 NOTE — Patient Instructions (Addendum)
Xray lumbar spine today Epidural @ [place] [phone] See me in 6 weeks

## 2020-10-06 NOTE — Progress Notes (Signed)
[place] Phone: [phone] Subjective:   [name], am serving as a scribe for Dr. Hulan Saas. This visit occurred during the SARS-CoV-2 public health emergency.  Safety protocols were in place, including screening questions prior to the visit, additional usage of staff PPE, and extensive cleaning of exam room while observing appropriate contact time as indicated for disinfecting solutions.   I'm seeing this patient by the request  of:  [name], MD  CC: Back pain follow-up  [id number]  Edwin [name] is a 80 y.o. male coming in with complaint of back and neck pain. OMT 07/14/2020. Patient states that he has been rowing, biking, and walking. Sometimes his lower back and legs get sore.  Patient states that it is kind of a dull, throbbing aching pain.  Patient states that when he sits it seems to get better.  Patient did have a CT scan of the abdomen and pelvis recently secondary to history of prostate cancer.  This was independently visualized by me showing patient did have significant degenerative disc disease.  This was from 2020.  Patient also had a whole-body scan done that also showed a right L4-L5 vertebrae degenerative changes  Medications patient has been prescribed: None  Taking:         Reviewed prior external information including notes and imaging from previsou exam, outside providers and external EMR if available.   As well as notes that were available from care everywhere and other healthcare systems.  Past medical history, social, surgical and family history all reviewed in electronic medical record.  No pertanent information unless stated regarding to the chief complaint.   Past Medical History:  Diagnosis Date  . BPH (benign prostatic hyperplasia)   . Cancer [facility])    Prostate Cancer  . Dysrhythmia    SVT  . Hyperlipidemia   . Hypertension   . PTSD (post-traumatic  stress disorder)   . Pulmonary nodules     Allergies  Allergen Reactions  . Amoxicillin-Pot Clavulanate Diarrhea  . Bacitracin-Neomycin-Polymyxin Hives  . Hydrochlorothiazide   . Lipitor [Atorvastatin]   . Lisinopril   . Neosporin [Neomycin-Bacitracin Zn-Polymyx]      Review of Systems:  No headache, visual changes, nausea, vomiting, diarrhea, constipation, dizziness, abdominal pain, skin rash, fevers, chills, night sweats, weight loss, swollen lymph nodes, body aches, joint swelling, chest pain, shortness of breath, mood changes. POSITIVE muscle aches  Objective  Blood pressure 124/76, pulse 76, height 6\' 1"  (1.854 m), SpO2 97 %.   General: No apparent distress alert and oriented x3 mood and affect normal, dressed appropriately.  HEENT: Pupils equal, extraocular movements intact  Respiratory: Patient's speak in full sentences and does not appear short of breath  Cardiovascular: No lower extremity edema, non tender, no erythema  Neuro: Cranial nerves II through XII are intact, neurovascularly intact in all extremities with 2+ DTRs and 2+ pulses.  Gait normal with good balance and coordination.  MSK:  Non tender with full range of motion and good stability and symmetric strength and tone of shoulders, elbows, wrist, hip, knee and ankles bilaterally.  Back -low back exam does have loss of lordosis.  Mild degenerative scoliosis.  Tightness with straight leg test.  Worsening pain with extension of the back.  Patient does have tightness noted in the thoracolumbar juncture as well.  Osteopathic findings  C6 flexed rotated and side bent left T3 extended rotated and side bent right inhaled rib  T9 extended rotated and side bent left L2 flexed rotated and side bent right Sacrum right on right       Assessment and Plan:  Lumbar radiculopathy Patient is having worsening lumbar radiculopathy. Also concern for more of a spinal stenosis.  Patient has had CT scans as well as the body  scan showing the patient does have degenerative changes mostly at the L4-L5 that is likely contributing and is consistent with patient's symptoms.  At this point I would like to consider the epidural at L4-L5.  Patient makes improvement that would be very helpful.  If not I would encourage patient consider possible MRI.  Patient was unable to tolerate the gabapentin.  May need to consider Cymbalta.  Follow-up again in 6 weeks    Nonallopathic problems  Decision today to treat with OMT was based on Physical Exam  After verbal consent patient was treated with HVLA, ME, FPR techniques in cervical, rib, thoracic, lumbar, and sacral  areas  Patient tolerated the procedure well with improvement in symptoms  Patient given exercises, stretches and lifestyle modifications  See medications in patient instructions if given  Patient will follow up in 4-8 weeks      The above documentation has been reviewed and is accurate and complete [name], DO       Note: This dictation was prepared with Dragon dictation along with smaller phrase technology. Any transcriptional errors that result from this process are unintentional.

## 2020-11-16 NOTE — Progress Notes (Signed)
Edwin Edwin [name] Phone: [phone] Subjective:   Edwin Edwin [name], am serving as a scribe for Dr. Hulan Saas. This visit occurred during the SARS-CoV-2 public health emergency.  Safety protocols were in place, including screening questions prior to the visit, additional usage of staff PPE, and extensive cleaning of exam room while observing appropriate contact time as indicated for disinfecting solutions.   I'm seeing this patient by the request  of:  Edwin Body, MD  CC: Low back pain follow-up  NID:[id number]  Edwin Edwin [name] is a 80 y.o. male coming in with complaint of back and neck pain. OMT 10/06/2020.  Patient states his hip flexors are tight.  Patient is doing a lot more sitting recently.  Because of that he has Edwin [name] significant more tightness of the hip flexors.  Not feeling good.  Not feeling quite like himself.  Unable to be quite as active as he would like to be.  Medications patient has been prescribed: None          Reviewed prior external information including notes and imaging from previsou exam, outside providers and external EMR if available.   As well as notes that were available from care everywhere and other healthcare systems.  Past medical history, social, surgical and family history all reviewed in electronic medical record.  Edwin [name] pertanent information unless stated regarding to the chief complaint.   Past Medical History:  Diagnosis Date  . BPH (benign prostatic hyperplasia)   . Cancer [facility])    Prostate Cancer  . Dysrhythmia    SVT  . Hyperlipidemia   . Hypertension   . PTSD (post-traumatic stress disorder)   . Pulmonary nodules     Allergies  Allergen Reactions  . Amoxicillin-Pot Clavulanate Diarrhea  . Bacitracin-Neomycin-Polymyxin Hives  . Hydrochlorothiazide   . Lipitor [Atorvastatin]   . Lisinopril   . Neosporin [Neomycin-Bacitracin Zn-Polymyx]      Review of  Systems:  Edwin [name] headache, visual changes, nausea, vomiting, diarrhea, constipation, dizziness, abdominal pain, skin rash, fevers, chills, night sweats, weight loss, swollen lymph nodes, [name] aches, joint swelling, chest pain, shortness of breath, mood changes. POSITIVE muscle aches  Objective  Blood pressure 118/84, pulse 81, height 6\' 1"  (1.854 m), SpO2 97 %.   General: Edwin [name] apparent distress alert and oriented x3 mood and affect normal, dressed appropriately.  HEENT: Pupils equal, extraocular movements intact  Respiratory: Patient's speak in full sentences and does not appear short of breath  Cardiovascular: Edwin [name] lower extremity edema, non tender, Edwin [name] erythema  Gait normal with good balance and coordination.  MSK: Significant arthritic changes noted of multiple joints. Back -medical exam shows that patient does have significant loss of lordosis.  Patient does have some tenderness to palpation.  Significant tightness with decreased range of motion in all planes.  Tightness noted with [name] right greater than left.  Questionable worsening pain with extension of the back.  Questionable positive straight leg test on the right side with mild radicular symptoms.  4+ out of 5 strength but symmetric bilaterally but still seems to be a little bit weaker than his baseline.  Osteopathic findings  C3 flexed rotated and side bent right T3 extended rotated and side bent right inhaled rib T9 extended rotated and side bent left L2 flexed rotated and side bent right L4 flexed rotated and side bent left Sacrum right on right       Assessment and Plan:  Lumbar radiculopathy Worsening lumbar  radiculopathy again.  Discussed with patient at this point that I do feel advanced imaging is warranted.  He does have a history of prostate cancer.  X-rays were taken by me on last exam and visualized again today.  Degenerative changes is noted with some very mild progression.  If you feel that MRI is interested at this  point Patient could be a candidate for possible epidurals.  Discussed with patient that if any worsening anxiety for the MRI could send in some Valium.  Discussed posture and ergonomics, discussed taking breaks from sitting for long amount at times.  Follow-up with me again 6 weeks    Nonallopathic problems  Decision today to treat with OMT was based on Physical Exam  After verbal consent patient was treated with HVLA, ME, FPR techniques in cervical, rib, thoracic, lumbar, and sacral  areas  Patient tolerated the procedure well with improvement in symptoms  Patient given exercises, stretches and lifestyle modifications  See medications in patient instructions if given  Patient will follow up in 4-8 weeks      The above documentation has been reviewed and is accurate and complete [name], DO       Note: This dictation was prepared with Dragon dictation along with smaller phrase technology. Any transcriptional errors that result from this process are unintentional.

## 2020-11-17 ENCOUNTER — Ambulatory Visit: Payer: Medicare Other | Admitting: Family Medicine

## 2020-11-17 ENCOUNTER — Encounter: Payer: Self-pay | Admitting: Family Medicine

## 2020-11-17 ENCOUNTER — Other Ambulatory Visit: Payer: Self-pay

## 2020-11-17 VITALS — BP 118/84 | HR 81 | Ht 73.0 in

## 2020-11-17 DIAGNOSIS — M9902 Segmental and somatic dysfunction of thoracic region: Secondary | ICD-10-CM

## 2020-11-17 DIAGNOSIS — M9904 Segmental and somatic dysfunction of sacral region: Secondary | ICD-10-CM

## 2020-11-17 DIAGNOSIS — M5416 Radiculopathy, lumbar region: Secondary | ICD-10-CM | POA: Diagnosis not present

## 2020-11-17 DIAGNOSIS — M9901 Segmental and somatic dysfunction of cervical region: Secondary | ICD-10-CM

## 2020-11-17 DIAGNOSIS — M9903 Segmental and somatic dysfunction of lumbar region: Secondary | ICD-10-CM

## 2020-11-17 DIAGNOSIS — M9908 Segmental and somatic dysfunction of rib cage: Secondary | ICD-10-CM

## 2020-11-17 DIAGNOSIS — C61 Malignant neoplasm of prostate: Secondary | ICD-10-CM

## 2020-11-17 NOTE — Patient Instructions (Addendum)
MRI lumbar spine [phone] Let me know if you need valium for MRI See me again in 6 weeks

## 2020-11-17 NOTE — Assessment & Plan Note (Signed)
Worsening lumbar radiculopathy again.  Discussed with patient at this point that I do feel advanced imaging is warranted.  He does have a history of prostate cancer.  X-rays were taken by me on last exam and visualized again today.  Degenerative changes is noted with some very mild progression.  If you feel that MRI is interested at this point Patient could be a candidate for possible epidurals.  Discussed with patient that if any worsening anxiety for the MRI could send in some Valium.  Discussed posture and ergonomics, discussed taking breaks from sitting for long amount at times.  Follow-up with me again 6 weeks

## 2020-12-28 NOTE — Progress Notes (Signed)
Edwin Edwin [name] Phone: [phone] Subjective:   Edwin Edwin [name], am serving as a scribe for Dr. Hulan [name].  This visit occurred during the SARS-CoV-2 public health emergency.  Safety protocols were in place, including screening questions prior to the visit, additional usage of staff PPE, and extensive cleaning of exam room while observing appropriate contact time as indicated for disinfecting solutions.    I'm seeing this patient by the request  of:  Edwin Body, MD  CC: Back pain follow-up  IDP:[id number]  Edwin Edwin [name] is a 80 y.o. male coming in with complaint of back and neck pain. OMT 11/17/2020. Patient states continues to have some mild discomfort and pain overall.  Nothing severe though.  Patient continues to have some mild radicular symptoms but not as severe.  Patient has not gotten the MRI at this moment because of he has been too busy.  Discussed with him again in great length that I did not feel this would be necessary still.  Medications patient has been prescribed: None  Taking:         Reviewed prior external information including notes and imaging from previsou exam, outside providers and external EMR if available.   As well as notes that were available from care everywhere and other healthcare systems.  Past medical history, social, surgical and family history all reviewed in electronic medical record.  Edwin [name] pertanent information unless stated regarding to the chief complaint.   Past Medical History:  Diagnosis Date   BPH (benign prostatic hyperplasia)    Cancer (HCC)    Prostate Cancer   Dysrhythmia    SVT   Hyperlipidemia    Hypertension    PTSD (post-traumatic stress disorder)    Pulmonary nodules     Allergies  Allergen Reactions   Amoxicillin-Pot Clavulanate Diarrhea   Bacitracin-Neomycin-Polymyxin Hives   Hydrochlorothiazide    Lipitor [Atorvastatin]    Lisinopril     Neosporin [Neomycin-Bacitracin Zn-Polymyx]      Review of Systems:  Edwin [name] headache, visual changes, nausea, vomiting, diarrhea, constipation, dizziness, abdominal pain, skin rash, fevers, chills, night sweats, weight loss, swollen lymph nodes, [name] aches, joint swelling, chest pain, shortness of breath, mood changes. POSITIVE muscle aches, [name] aches  Objective  Blood pressure 130/76, pulse 67, height 6\' 1"  (1.854 m), weight 234 lb (106.1 kg), SpO2 97 %.   General: Edwin [name] apparent distress alert and oriented x3 mood and affect normal, dressed appropriately.  HEENT: Pupils equal, extraocular movements intact  Respiratory: Patient's speak in full sentences and does not appear short of breath  Cardiovascular: Edwin [name] lower extremity edema, non tender, Edwin [name] erythema  Low back exam still has severe tightness overall.  Still has significant tightness noted with straight leg test.  4+ out of 5 strength of the lower extremities bilaterally.  Still seems little bit weaker than his baseline.  Positive straight leg test on the right side with mild radicular symptoms in the L4-L5 distribution.  Significant tightness with all range of motion.  Osteopathic findings  C4 flexed rotated and side bent left T3 extended rotated and side bent right inhaled rib L2 flexed rotated and side bent right L4 flexed rotated and side bent right Sacrum right on right       Assessment and Plan:  Lumbar radiculopathy Continues to have signs and symptoms consistent with more of a lumbar radiculopathy and potentially spinal stenosis.  Patient has been ordered a MRI.  He has not  done it yet.  Encouraged him to do so.  Patient states that he well when he has time.  Patient states he is not having worsening pain.  Edwin [name] fevers chills or any abnormal weight loss.  He does have a history of prostate cancer.  Patient understands that we cannot make any significant changes in treatment until we have the imaging.  Patient is in agreement with  the plan.   Nonallopathic problems  Decision today to treat with OMT was based on Physical Exam  After verbal consent patient was treated with HVLA, ME, FPR techniques in cervical, rib, thoracic, lumbar, and sacral  areas  Patient tolerated the procedure well with improvement in symptoms  Patient given exercises, stretches and lifestyle modifications  See medications in patient instructions if given  Patient will follow up in 4-8 weeks      The above documentation has been reviewed and is accurate and complete Edwin Pulley, DO       Note: This dictation was prepared with Dragon dictation along with smaller phrase technology. Any transcriptional errors that result from this process are unintentional.

## 2020-12-29 ENCOUNTER — Other Ambulatory Visit: Payer: Self-pay

## 2020-12-29 ENCOUNTER — Encounter: Payer: Self-pay | Admitting: Family Medicine

## 2020-12-29 ENCOUNTER — Ambulatory Visit: Payer: Medicare Other | Admitting: Family Medicine

## 2020-12-29 VITALS — BP 130/76 | HR 67 | Ht 73.0 in | Wt 234.0 lb

## 2020-12-29 DIAGNOSIS — M9903 Segmental and somatic dysfunction of lumbar region: Secondary | ICD-10-CM

## 2020-12-29 DIAGNOSIS — M9901 Segmental and somatic dysfunction of cervical region: Secondary | ICD-10-CM | POA: Diagnosis not present

## 2020-12-29 DIAGNOSIS — M9908 Segmental and somatic dysfunction of rib cage: Secondary | ICD-10-CM

## 2020-12-29 DIAGNOSIS — M9904 Segmental and somatic dysfunction of sacral region: Secondary | ICD-10-CM | POA: Diagnosis not present

## 2020-12-29 DIAGNOSIS — M9902 Segmental and somatic dysfunction of thoracic region: Secondary | ICD-10-CM | POA: Diagnosis not present

## 2020-12-29 DIAGNOSIS — M5416 Radiculopathy, lumbar region: Secondary | ICD-10-CM

## 2020-12-29 NOTE — Assessment & Plan Note (Signed)
Continues to have signs and symptoms consistent with more of a lumbar radiculopathy and potentially spinal stenosis.  Patient has been ordered a MRI.  He has not done it yet.  Encouraged him to do so.  Patient states that he well when he has time.  Patient states he is not having worsening pain.  No fevers chills or any abnormal weight loss.  He does have a history of prostate cancer.  Patient understands that we cannot make any significant changes in treatment until we have the imaging.  Patient is in agreement with the plan.

## 2020-12-29 NOTE — Patient Instructions (Signed)
See me again in 5 weeks 

## 2021-01-11 ENCOUNTER — Ambulatory Visit: Payer: Medicare Other | Admitting: Dermatology

## 2021-01-11 ENCOUNTER — Other Ambulatory Visit: Payer: Self-pay

## 2021-01-11 DIAGNOSIS — L821 Other seborrheic keratosis: Secondary | ICD-10-CM

## 2021-01-11 DIAGNOSIS — D1800 Hemangioma unspecified site: Secondary | ICD-10-CM

## 2021-01-11 DIAGNOSIS — D229 Melanocytic nevi, unspecified: Secondary | ICD-10-CM | POA: Diagnosis not present

## 2021-01-11 DIAGNOSIS — L578 Other skin changes due to chronic exposure to nonionizing radiation: Secondary | ICD-10-CM | POA: Diagnosis not present

## 2021-01-11 DIAGNOSIS — Z1283 Encounter for screening for malignant neoplasm of skin: Secondary | ICD-10-CM

## 2021-01-11 DIAGNOSIS — L570 Actinic keratosis: Secondary | ICD-10-CM

## 2021-01-11 DIAGNOSIS — L820 Inflamed seborrheic keratosis: Secondary | ICD-10-CM | POA: Diagnosis not present

## 2021-01-11 DIAGNOSIS — L814 Other melanin hyperpigmentation: Secondary | ICD-10-CM

## 2021-01-11 NOTE — Patient Instructions (Addendum)
Cryotherapy Aftercare  Wash gently with soap and water everyday.   Apply Vaseline and Band-Aid daily until healed.    Instructions for [facility] Medications  One or more of your medications was sent to the [facility] mail order compounding pharmacy. You will receive an email from them and can purchase the medicine through that link. It will then be mailed to your home at the address you confirmed. If for any reason you do not receive an email from them, please check your spam folder. If you still do not find the email, please let us know. [facility] phone number is [phone].   5-Fluorouracil/Calcipotriene Patient Education   Actinic keratoses are the dry, red scaly spots on the skin caused by sun damage. A portion of these spots can turn into skin cancer with time, and treating them can help prevent development of skin cancer.   Treatment of these spots requires removal of the defective skin cells. There are various ways to remove actinic keratoses, including freezing with liquid nitrogen, treatment with creams, or treatment with a blue light procedure in the office.   5-fluorouracil cream is a topical cream used to treat actinic keratoses. It works by interfering with the growth of abnormal fast-growing skin cells, such as actinic keratoses. These cells peel off and are replaced by healthy ones.   5-fluorouracil/calcipotriene is a combination of the 5-fluorouracil cream with a vitamin D analog cream called calcipotriene. The calcipotriene alone does not treat actinic keratoses. However, when it is combined with 5-fluorouracil, it helps the 5-fluorouracil treat the actinic keratoses much faster so that the same results can be achieved with a much shorter treatment time.  INSTRUCTIONS FOR 5-FLUOROURACIL/CALCIPOTRIENE CREAM:   5-fluorouracil/calcipotriene cream typically only needs to be used for 4-7 days. A thin layer should be applied twice a day to the treatment areas  recommended by your physician.   If your physician prescribed you separate tubes of 5-fluourouracil and calcipotriene, apply a thin layer of 5-fluorouracil followed by a thin layer of calcipotriene.   Avoid contact with your eyes, nostrils, and mouth. Do not use 5-fluorouracil/calcipotriene cream on infected or open wounds.   You will develop redness, irritation and some crusting at areas where you have pre-cancer damage/actinic keratoses. IF YOU DEVELOP PAIN, BLEEDING, OR SIGNIFICANT CRUSTING, STOP THE TREATMENT EARLY - you have already gotten a good response and the actinic keratoses should clear up well.  Wash your hands after applying 5-fluorouracil 5% cream on your skin.   A moisturizer or sunscreen with a minimum SPF 30 should be applied each morning.   Once you have finished the treatment, you can apply a thin layer of Vaseline twice a day to irritated areas to soothe and calm the areas more quickly. If you experience significant discomfort, contact your physician.  For some patients it is necessary to repeat the treatment for best results.  SIDE EFFECTS: When using 5-fluorouracil/calcipotriene cream, you may have mild irritation, such as redness, dryness, swelling, or a mild burning sensation. This usually resolves within 2 weeks. The more actinic keratoses you have, the more redness and inflammation you can expect during treatment. Eye irritation has been reported rarely. If this occurs, please let us know.  If you have any trouble using this cream, please call the office. If you have any other questions about this information, please do not hesitate to ask me before you leave the office.   If you have any questions or concerns for your doctor, please call our  main line at [phone] and press option 4 to reach your doctor's medical assistant. If no one answers, please leave a voicemail as directed and we will return your call as soon as possible. Messages left after 4 pm will be  answered the following business day.   You may also send [place] a message via [place]. We typically respond to MyChart messages within 1-2 business days.  For prescription refills, please ask your pharmacy to contact our office. Our fax number is [phone].  If you have an urgent issue when the clinic is closed that cannot wait until the next business day, you can page your doctor at the number below.    Please note that while we do our best to be available for urgent issues outside of office hours, we are not available 24/7.   If you have an urgent issue and are unable to reach [place], you may choose to seek medical care at your doctor's office, retail clinic, urgent care center, or emergency room.  If you have a medical emergency, please immediately call 911 or go to the emergency department.  Pager Numbers  - Dr. Nehemiah Massed: [phone]  - Dr. Laurence Ferrari: [phone]  - Dr. Nicole Kindred: [phone]  In the event of inclement weather, please call our main line at [phone] for an update on the status of any delays or closures.  Dermatology Medication Tips: Please keep the boxes that topical medications come in in order to help keep track of the instructions about where and how to use these. Pharmacies typically print the medication instructions only on the boxes and not directly on the medication tubes.   If your medication is too expensive, please contact our office at [phone] option 4 or send [place] a message through [place].   We are unable to tell what your co-pay for medications will be in advance as this is different depending on your insurance coverage. However, we may be able to find a substitute medication at lower cost or fill out paperwork to get insurance to cover a needed medication.   If a prior authorization is required to get your medication covered by your insurance company, please allow [place] 1-2 business days to complete this process.  Drug prices often vary depending on  where the prescription is filled and some pharmacies may offer cheaper prices.  The website www.goodrx.com contains coupons for medications through different pharmacies. The prices here do not account for what the cost may be with help from insurance (it may be cheaper with your insurance), but the website can give you the price if you did not use any insurance.  - You can print the associated coupon and take it with your prescription to the pharmacy.  - You may also stop by our office during regular business hours and pick up a GoodRx coupon card.  - If you need your prescription sent electronically to a different pharmacy, notify our office through [facility] or by phone at [phone] option 4.

## 2021-01-11 NOTE — Progress Notes (Signed)
Follow-Up Visit   Subjective  Edwin [name] is a 80 y.o. male who presents for the following: Skin Problem (Check rough places on both ears, hx of several skin cancers).  The patient presents for Upper Body Skin Exam (UBSE) for skin cancer screening and mole check.   The following portions of the chart were reviewed this encounter and updated as appropriate:   Tobacco  Allergies  Meds  Problems  Med Hx  Surg Hx  Fam Hx      Review of Systems:  No other skin or systemic complaints except as noted in HPI or Assessment and Plan.  Objective  Well appearing patient in no apparent distress; mood and affect are within normal limits.  All skin waist up examined.  ears, scalp (16) Erythematous thin papules/macules with gritty scale.   left postauricular x 2, left wrist x 1, right post auricular x 5 (8) Erythematous keratotic or waxy stuck-on papule or plaque.    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, scalp  Destruction of lesion - ears, scalp  Destruction method: cryotherapy   Informed consent: discussed and consent obtained   Lesion destroyed using liquid nitrogen: Yes   Region frozen until ice ball extended beyond lesion: Yes   Outcome: patient tolerated procedure well with no complications   Post-procedure details: wound care instructions given    Inflamed seborrheic keratosis left postauricular x 2, left wrist x 1, right post auricular x 5  Destruction of lesion - left postauricular x 2, left wrist x 1, right post auricular x 5 Complexity: simple   Destruction method: cryotherapy   Informed consent: discussed and consent obtained   Timeout:  patient name, date of birth, surgical site, and procedure verified Lesion destroyed using liquid nitrogen: Yes   Region frozen until ice ball extended beyond lesion: Yes   Outcome: patient tolerated procedure well with no complications   Post-procedure details: wound care instructions given    Skin cancer  screening  Actinic Damage - Severe, confluent actinic changes with pre-cancerous actinic keratoses  - Severe, chronic, not at goal, secondary to cumulative UV radiation exposure over time - diffuse scaly erythematous macules and papules with underlying dyspigmentation - Discussed Prescription "Field Treatment" for Severe, Chronic Confluent Actinic Changes with Pre-Cancerous Actinic Keratoses  Start 5FU/Calcipotriene cream in 1 month Apply to scalp/forehead/temples  twice a day x 7 days Field treatment involves treatment of an entire area of skin that has confluent Actinic Changes (Sun/ Ultraviolet light damage) and PreCancerous Actinic Keratoses by method of PhotoDynamic Therapy (PDT) and/or prescription Topical Chemotherapy agents such as 5-fluorouracil, 5-fluorouracil/calcipotriene, and/or imiquimod.  The purpose is to decrease the number of clinically evident and subclinical PreCancerous lesions to prevent progression to development of skin cancer by chemically destroying early precancer changes that may or may not be visible.  It has been shown to reduce the risk of developing skin cancer in the treated area. As a result of treatment, redness, scaling, crusting, and open sores may occur during treatment course. One or more than one of these methods may be used and may have to be used several times to control, suppress and eliminate the PreCancerous changes. Discussed treatment course, expected reaction, and possible side effects. - Recommend daily broad spectrum sunscreen SPF 30+ to sun-exposed areas, reapply every 2 hours as needed.  - Staying in the shade or wearing long sleeves, sun glasses (UVA+UVB protection) and wide brim hats (4-inch brim around the entire circumference of the hat)  are also recommended. - Call for new or changing lesions.   Seborrheic Keratoses back - Stuck-on, waxy, tan-brown papules and/or plaques  - Benign-appearing - Discussed benign etiology and prognosis. -  Observe - Call for any changes  Lentigines - Scattered tan macules - Due to sun exposure - Benign-appering, observe - Recommend daily broad spectrum sunscreen SPF 30+ to sun-exposed areas, reapply every 2 hours as needed. - Call for any changes  Melanocytic Nevi - Tan-brown and/or pink-flesh-colored symmetric macules and papules - Benign appearing on exam today - Observation - Call clinic for new or changing moles - Recommend daily use of broad spectrum spf 30+ sunscreen to sun-exposed areas.   Hemangiomas - Red papules - Discussed benign nature - Observe - Call for any changes  Actinic Damage - Chronic condition, secondary to cumulative UV/sun exposure - diffuse scaly erythematous macules with underlying dyspigmentation - Recommend daily broad spectrum sunscreen SPF 30+ to sun-exposed areas, reapply every 2 hours as needed.  - Staying in the shade or wearing long sleeves, sun glasses (UVA+UVB protection) and wide brim hats (4-inch brim around the entire circumference of the hat) are also recommended for sun protection.  - Call for new or changing lesions.  Skin cancer screening performed today.    Return in about 8 months (around 09/13/2021) for Aks .  IMarye Round, CMA, am acting as scribe for Sarina Ser, MD .  Documentation: I have reviewed the above documentation for accuracy and completeness, and I agree with the above.  Sarina Ser, MD

## 2021-01-17 ENCOUNTER — Encounter: Payer: Self-pay | Admitting: Dermatology

## 2021-02-08 NOTE — Progress Notes (Signed)
[place] Phone: [phone] Subjective:    I'm seeing this patient by the request  of:  [name], MD  I, Peterson Lombard, LAT, ATC acting as a scribe for [name], DO.  CC: Neck and back pain follow-up  [id number]  Edwin [name] is a 80 y.o. male coming in with complaint of back and neck pain. OMT 12/29/2020. Patient states he is feeling about the same and is here for his typical maintenance adjustments. No worsening issues or new problems.  Nothing new at this time.  Has been able to continue to ride his bike.  We have discussed again about the possibility of imaging which patient is still not done at this time.  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   right lateral deltoid BASAL CELL CARCINOMA, SUPERFICIAL AND NODULAR PATTERNS   BCC (basal cell carcinoma of skin) 04/23/2017   left lat neck, excision RESIDUAL BASAL CELL CARCINOMA, MARGINS FREE   BCC (basal cell carcinoma of skin) 03/13/2017   left lateral neck  BASAL CELL CARCINOMA, NODULAR PATTERN   BCC (basal cell carcinoma of skin) 12/25/2012   left pretibial   BCC (basal cell carcinoma of skin) 05/21/2007   left medial calf - , 0.8 X 0.5CM: SUPERFICIAL BASAL CELL CARCINOMA, BASE INVOLVED   BPH (benign prostatic hyperplasia)    Cancer (HCC)    Prostate Cancer   Dysrhythmia    SVT   Hyperlipidemia    Hypertension    PTSD (post-traumatic stress disorder)    Pulmonary nodules    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 POSITIVE muscle aches, body aches, increasing stress with work  Objective  Height 6\' 1"  (1.854 m), weight 232 lb (105.2 kg).   General: No apparent distress alert and oriented x3 mood and affect normal, dressed appropriately.  HEENT: Pupils equal, extraocular movements intact  Respiratory: Patient's speak in full sentences and does not appear short of breath  Cardiovascular: No lower extremity edema, non tender, no erythema  Low back exam does have loss of lordosis.  Tenderness to palpation in the paraspinal musculature.  Significant tightness with FABER test.  Patient does have some limited range of motion in the lumbar spine with flexion and extension. Arthritic changes of multiple joints   Osteopathic findings  C5 flexed rotated and side bent right T6 extended rotated and side bent right inhaled rib T9 extended rotated and side bent left L2 flexed rotated and side bent right Sacrum right on right       Assessment and Plan:  Lumbar radiculopathy Continues to have significant tightness noted.  Once again no fevers chills or any abnormal weight loss.  We will still would not build think that advanced imaging could be warranted at some point to further evaluate to see if patient is a candidate for  any injections.  Patient still wants to hold because patient does not want to change medical management at this point and follow-up again in 6 weeks   Nonallopathic problems  Decision today to treat with OMT was based on Physical Exam  After verbal consent patient was treated with HVLA, ME, FPR techniques in cervical, rib, thoracic, lumbar, and sacral  areas  Patient tolerated the procedure well with improvement in symptoms  Patient given exercises,  stretches and lifestyle modifications  See medications in patient instructions if given  Patient will follow up in 4-8 weeks      The above documentation has been reviewed and is accurate and complete [name], DO        Note: This dictation was prepared with Dragon dictation along with smaller phrase technology. Any transcriptional errors that result from this process are unintentional.

## 2021-02-09 ENCOUNTER — Ambulatory Visit: Payer: Medicare Other | Admitting: Family Medicine

## 2021-02-09 ENCOUNTER — Encounter: Payer: Self-pay | Admitting: Family Medicine

## 2021-02-09 ENCOUNTER — Other Ambulatory Visit: Payer: Self-pay

## 2021-02-09 VITALS — Ht 73.0 in | Wt 232.0 lb

## 2021-02-09 DIAGNOSIS — M9904 Segmental and somatic dysfunction of sacral region: Secondary | ICD-10-CM

## 2021-02-09 DIAGNOSIS — M9901 Segmental and somatic dysfunction of cervical region: Secondary | ICD-10-CM | POA: Diagnosis not present

## 2021-02-09 DIAGNOSIS — M9902 Segmental and somatic dysfunction of thoracic region: Secondary | ICD-10-CM

## 2021-02-09 DIAGNOSIS — M5416 Radiculopathy, lumbar region: Secondary | ICD-10-CM | POA: Diagnosis not present

## 2021-02-09 DIAGNOSIS — M9903 Segmental and somatic dysfunction of lumbar region: Secondary | ICD-10-CM

## 2021-02-09 DIAGNOSIS — M9908 Segmental and somatic dysfunction of rib cage: Secondary | ICD-10-CM

## 2021-02-09 NOTE — Assessment & Plan Note (Signed)
Continues to have significant tightness noted.  Once again no fevers chills or any abnormal weight loss.  We will still would not build think that advanced imaging could be warranted at some point to further evaluate to see if patient is a candidate for any injections.  Patient still wants to hold because patient does not want to change medical management at this point and follow-up again in 6 weeks

## 2021-02-09 NOTE — Patient Instructions (Addendum)
Good to see you  Don't let the courts get your down.  See me again in 6 weeks.

## 2021-03-22 NOTE — Progress Notes (Signed)
[name] [place] Phone: [phone] Subjective:   [name], am serving as a scribe for Dr. Hulan Saas.  This visit occurred during the SARS-CoV-2 public health emergency.  Safety protocols were in place, including screening questions prior to the visit, additional usage of staff PPE, and extensive cleaning of exam room while observing appropriate contact time as indicated for disinfecting solutions.   I'm seeing this patient by the request  of:  Edwin Body, MD  CC: Neck and back pain follow-up  [id number]  Edwin [name] is a 80 y.o. male coming in with complaint of back and neck pain. OMT on 02/09/2021. Patient states that he is the same as last time. No changes in pain.  Patient continues to have some discomfort.  More in the lower back.  Medications patient has been prescribed: Gabapentin  Taking: No       Did review patient's most recent lab including PSA which was normal  Reviewed prior external information including notes and imaging from previsou exam, outside providers and external EMR if available.   As well as notes that were available from care everywhere and other healthcare systems.  Past medical history, social, surgical and family history all reviewed in electronic medical record.  No pertanent information unless stated regarding to the chief complaint.   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   right lateral deltoid BASAL CELL CARCINOMA, SUPERFICIAL AND NODULAR PATTERNS   BCC (basal cell carcinoma of skin) 04/23/2017   left lat neck, excision RESIDUAL BASAL CELL CARCINOMA, MARGINS FREE   BCC (basal cell carcinoma of skin) 03/13/2017   left lateral neck  BASAL CELL CARCINOMA, NODULAR PATTERN   BCC (basal cell carcinoma of skin) 12/25/2012   left pretibial   BCC (basal cell carcinoma of skin) 05/21/2007   left medial calf - , 0.8  X 0.5CM: SUPERFICIAL BASAL CELL CARCINOMA, BASE INVOLVED   BPH (benign prostatic hyperplasia)    Cancer (HCC)    Prostate Cancer   Dysrhythmia    SVT   Hyperlipidemia    Hypertension    PTSD (post-traumatic stress disorder)    Pulmonary nodules    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 POSITIVE muscle aches  Objective  Blood pressure 128/80, pulse 72, height '6\' 1"'$  (1.854 m), SpO2 97 %.   General: No apparent distress alert and oriented x3 mood and affect normal, dressed appropriately.  HEENT: Pupils equal, extraocular movements intact  Respiratory: Patient's speak in full sentences and does not appear short of breath  Cardiovascular: No lower extremity edema, non tender, no erythema  Significant loss of lordosis of the low back noted.  Patient has very minimal range of motion.  Tightness noted with FABER test bilaterally. Neurovascular intact distally.  Osteopathic findings  C2 flexed rotated and side bent right C5 flexed rotated and side bent left T3 extended rotated and side bent right  inhaled rib T5 extended rotated and side bent left L2 flexed rotated and side bent right Sacrum right on right       Assessment and Plan: Lumbar radiculopathy Discussed with patient again.  Patient is doing relatively well.  Not having as much radicular symptoms.  Still concern for some potential spinal stenosis.  Responding well to  the manipulation.  We can always consider the possibility of advanced imaging especially patient's history of prostate cancer the most recent labs show the patient his prostate antigen is still significantly low.  Patient wants to continue with the conservative therapy.  We will continue to stay active.  Continue the gabapentin and follow-up with me again 6 weeks    Nonallopathic problems  Decision today to treat with OMT was based on Physical Exam  After verbal consent patient was treated with HVLA, ME, FPR techniques in cervical, rib, thoracic, lumbar, and sacral  areas  Patient tolerated the procedure well with improvement in symptoms  Patient given exercises, stretches and lifestyle modifications  See medications in patient instructions if given  Patient will follow up in 4-8 weeks      The above documentation has been reviewed and is accurate and complete [name], DO        Note: This dictation was prepared with Dragon dictation along with smaller phrase technology. Any transcriptional errors that result from this process are unintentional.

## 2021-03-23 ENCOUNTER — Other Ambulatory Visit: Payer: Self-pay

## 2021-03-23 ENCOUNTER — Ambulatory Visit: Payer: Medicare Other | Admitting: Family Medicine

## 2021-03-23 ENCOUNTER — Encounter: Payer: Self-pay | Admitting: Family Medicine

## 2021-03-23 VITALS — BP 128/80 | HR 72 | Ht 73.0 in

## 2021-03-23 DIAGNOSIS — M5416 Radiculopathy, lumbar region: Secondary | ICD-10-CM

## 2021-03-23 DIAGNOSIS — M9903 Segmental and somatic dysfunction of lumbar region: Secondary | ICD-10-CM | POA: Diagnosis not present

## 2021-03-23 DIAGNOSIS — M9908 Segmental and somatic dysfunction of rib cage: Secondary | ICD-10-CM

## 2021-03-23 DIAGNOSIS — M9901 Segmental and somatic dysfunction of cervical region: Secondary | ICD-10-CM | POA: Diagnosis not present

## 2021-03-23 DIAGNOSIS — M9902 Segmental and somatic dysfunction of thoracic region: Secondary | ICD-10-CM

## 2021-03-23 DIAGNOSIS — M9904 Segmental and somatic dysfunction of sacral region: Secondary | ICD-10-CM | POA: Diagnosis not present

## 2021-03-23 NOTE — Patient Instructions (Signed)
Good luck with the bike See me in 6 weeks

## 2021-03-23 NOTE — Assessment & Plan Note (Signed)
Discussed with patient again.  Patient is doing relatively well.  Not having as much radicular symptoms.  Still concern for some potential spinal stenosis.  Responding well to the manipulation.  We can always consider the possibility of advanced imaging especially patient's history of prostate cancer the most recent labs show the patient his prostate antigen is still significantly low.  Patient wants to continue with the conservative therapy.  We will continue to stay active.  Continue the gabapentin and follow-up with me again 6 weeks

## 2021-04-06 ENCOUNTER — Telehealth: Payer: Self-pay | Admitting: *Deleted

## 2021-04-06 NOTE — Telephone Encounter (Signed)
Pt's wife called stating that the ankle Dr. Tamala Julian adjusted at the last OV is swelling and he would like to know what you recommend.

## 2021-04-26 ENCOUNTER — Telehealth: Payer: Self-pay | Admitting: Family Medicine

## 2021-04-26 NOTE — Telephone Encounter (Signed)
Edwin [name] called [phone]), about 1 year ago Dr. Tamala Julian recommended a combo of Nexium and Pepto to help her GI issues. [name] is having some issues and she wonders if he could try this. She thinks it was 80 mg of Nexium daily + an unknown amount of Pepto.  Please call [name], she is having trouble messaging in [place].

## 2021-04-27 NOTE — Telephone Encounter (Signed)
Spoke with [name] per recommendations.

## 2021-05-03 NOTE — Progress Notes (Deleted)
[place] Phone: [phone] Subjective:    I'm seeing this patient by the request  of:  [name], MD  CC:   [id number]  Edwin [name] is a 80 y.o. male coming in with complaint of back and neck pain. OMT on 03/23/2021. Patient states   Medications patient has been prescribed: none   Taking:         Reviewed prior external information including notes and imaging from previsou exam, outside providers and external EMR if available.   As well as notes that were available from care everywhere and other healthcare systems.  Past medical history, social, surgical and family history all reviewed in electronic medical record.  No pertanent information unless stated regarding to the chief complaint.   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   right lateral deltoid BASAL CELL CARCINOMA, SUPERFICIAL AND NODULAR PATTERNS   BCC (basal cell carcinoma of skin) 04/23/2017   left lat neck, excision RESIDUAL BASAL CELL CARCINOMA, MARGINS FREE   BCC (basal cell carcinoma of skin) 03/13/2017   left lateral neck  BASAL CELL CARCINOMA, NODULAR PATTERN   BCC (basal cell carcinoma of skin) 12/25/2012   left pretibial   BCC (basal cell carcinoma of skin) 05/21/2007   left medial calf - , 0.8 X 0.5CM: SUPERFICIAL BASAL CELL CARCINOMA, BASE INVOLVED   BPH (benign prostatic hyperplasia)    Cancer (HCC)    Prostate Cancer   Dysrhythmia    SVT   Hyperlipidemia    Hypertension    PTSD (post-traumatic stress disorder)    Pulmonary nodules    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 POSITIVE muscle aches  Objective  There were no vitals taken for this visit.   General: No apparent distress alert and oriented x3 mood and affect normal, dressed appropriately.  HEENT: Pupils equal, extraocular movements intact  Respiratory: Patient's speak in full sentences and does not appear short of breath  Cardiovascular: No lower extremity edema, non tender, no erythema  Neuro: Cranial nerves II through XII are intact, neurovascularly intact in all extremities with 2+ DTRs and 2+ pulses.  Gait normal with good balance and coordination.  MSK:  Non tender with full range of motion and good stability and symmetric strength and tone of shoulders, elbows, wrist, hip, knee and ankles bilaterally.  Back - Normal skin, Spine with normal alignment and no deformity.  No tenderness to vertebral process palpation.  Paraspinous muscles are not tender and without spasm.   Range of motion is full at neck and lumbar sacral regions  Osteopathic findings  C2 flexed rotated and side bent right C6 flexed rotated and side bent left T3 extended rotated and side bent right inhaled rib T9 extended rotated and side bent left L2 flexed rotated and side bent right Sacrum right on right       Assessment and Plan:  Nonallopathic problems  Decision today to treat with OMT was based on Physical Exam  After verbal consent patient was treated with HVLA, ME, FPR techniques in cervical, rib, thoracic, lumbar, and sacral  areas  Patient tolerated the procedure well with improvement in symptoms  Patient given exercises, stretches and lifestyle modifications  See medications in patient instructions if given  Patient will follow up  in 4-8 weeks      The above documentation has been reviewed and is accurate and complete [name]       Note: This dictation was prepared with Diplomatic Services operational officer dictation along with smaller Company secretary. Any transcriptional errors that result from this process are unintentional.

## 2021-05-04 ENCOUNTER — Ambulatory Visit: Payer: Medicare Other | Admitting: Family Medicine

## 2021-05-04 ENCOUNTER — Telehealth: Payer: Self-pay | Admitting: Family Medicine

## 2021-05-04 NOTE — Telephone Encounter (Signed)
This documentation was sent from [facility] via [place] but attached to an appt. I was unsure what needed to be done.    Hey dr Tamala Julian, thanks for your help today. Talked to g, hes ready to do the mri. Let me know what we need to do from here. Not sure if the order is too old. Weve got some time off, we're more than willing to stick around if there's any chance of him getting in. Know they stay very busy!  G said he wants my commission for the sale, lol. Thank you for taking care of [place]

## 2021-05-08 ENCOUNTER — Other Ambulatory Visit: Payer: Self-pay

## 2021-05-08 DIAGNOSIS — M25551 Pain in right hip: Secondary | ICD-10-CM

## 2021-05-08 DIAGNOSIS — M545 Low back pain, unspecified: Secondary | ICD-10-CM

## 2021-05-09 NOTE — Telephone Encounter (Signed)
Ordered. Patient notified. 

## 2021-05-22 MED ORDER — DIAZEPAM 5 MG PO TABS: ORAL_TABLET | ORAL | 0 refills | Status: DC

## 2021-06-01 ENCOUNTER — Other Ambulatory Visit: Payer: Self-pay

## 2021-06-01 ENCOUNTER — Ambulatory Visit
Admission: RE | Admit: 2021-06-01 | Discharge: 2021-06-01 | Disposition: A | Discharge status: Home / Self Care | Payer: Medicare Other | Source: Ambulatory Visit | Attending: Family Medicine | Admitting: Family Medicine

## 2021-06-01 DIAGNOSIS — M25552 Pain in left hip: Secondary | ICD-10-CM

## 2021-06-01 DIAGNOSIS — M25551 Pain in right hip: Secondary | ICD-10-CM

## 2021-06-01 DIAGNOSIS — M545 Low back pain, unspecified: Secondary | ICD-10-CM

## 2021-06-01 MED ORDER — GADOBENATE DIMEGLUMINE 529 MG/ML IV SOLN
20.0000 mL | Freq: Once | INTRAVENOUS | Status: AC | PRN
  Administered 2021-06-01: 20 mL via INTRAVENOUS

## 2021-06-02 NOTE — Progress Notes (Signed)
Edwin [name] D.Edwin [name] Edwin [name] Phone: [phone]   Assessment and Plan:     1. Lumbar spine pain 2. Somatic dysfunction of cervical region 3. Somatic dysfunction of thoracic region 4. Somatic dysfunction of lumbar region 5. Somatic dysfunction of pelvic region 6. Somatic dysfunction of rib region -Chronic with exacerbation, subsequent visit - Recurrence of typical musculoskeletal complaints with worst being in lumbar spine - Patient has received significant relief with OMT in the past.  Elects for repeat OMT today.  Tolerated well per note below. - Decision today to treat with OMT was based on Physical Exam   After verbal consent patient was treated with HVLA (high velocity low amplitude), ME (muscle energy), FPR (flex positional release), ST (soft tissue), PC/PD (Pelvic Compression/ Pelvic Decompression) techniques in cervical, rib, thoracic, lumbar, and pelvic areas. Patient tolerated the procedure well with improvement in symptoms.  Patient educated on potential side effects of soreness and recommended to rest, hydrate, and use Tylenol as needed for pain control.   Pertinent previous records reviewed include none   Follow Up: 4 to 6 weeks for repeat OMT   Subjective:   I, Edwin [name], am serving as a scribe for Dr. Glennon [name]  Chief Complaint: Neck and back pain   HPI:   06/05/21 Patient is a 80 year old male presenting with neck and back pain. Patient was last seen by Dr. Tamala [name] on 03/23/21 for this reason and had OMT. Today patient states Maintenance OMT.   Relevant Historical Information: Hypertension, history of prostate cancer, hyperlipidemia, history of lumbar spinal surgery  Additional pertinent review of systems negative.  Current Outpatient Medications  Medication Sig Dispense Refill   aspirin EC 81 MG tablet Take 81 mg by mouth daily.     bicalutamide (CASODEX) 50 MG tablet TK 1 T PO QD FOR 7 DAYS  PRIOR TO INJECTION OF LEUPROLIDE ACETATE THEN 1 T QD FOR 5 DAYS AFTER     co-enzyme Q-10 30 MG capsule Take 30 mg by mouth 3 (three) times daily.     diazepam (VALIUM) 5 MG tablet One tab by mouth, 2 hours before procedure. 2 tablet 0   gabapentin (NEURONTIN) 100 MG capsule Take 2 capsules (200 mg total) by mouth at bedtime. 60 capsule 3   gabapentin (NEURONTIN) 100 MG capsule Take 2 capsules (200 mg total) by mouth at bedtime. 180 capsule 3   itraconazole (SPORANOX) 100 MG capsule Take 100 mg by mouth 2 (two) times daily.     Krill Oil 1000 MG CAPS Take 2 capsules by mouth daily.     MAGNESIUM GLUCONATE PO Take 400 mg by mouth at bedtime.     Multiple Vitamin (MULTIVITAMIN) tablet Take 1 tablet by mouth daily.     Ubiquinol 50 MG CAPS Take 1 tablet by mouth.     valsartan (DIOVAN) 80 MG tablet Take by mouth.     Vitamin D, Ergocalciferol, (DRISDOL) 1.25 MG (50000 UNIT) CAPS capsule Take 1 capsule (50,000 Units total) by mouth every 7 (seven) days. 12 capsule 0   Vitamin D, Ergocalciferol, (DRISDOL) 1.25 MG (50000 UNIT) CAPS capsule Take 1 capsule (50,000 Units total) by mouth every 7 (seven) days. 12 capsule 0   No current facility-administered medications for this visit.      Objective:     Vitals:   06/05/21 0951  BP: 134/84  Pulse: 70  SpO2: 97%  Weight: 245 lb (111.1 kg)  Height: 6'  1" (1.854 m)      Body mass index is 32.32 kg/m.    Physical Exam:     General: Well-appearing, cooperative, sitting comfortably in no acute distress.   OMT Physical Exam:  ASIS Compression Test: Positive Right Cervical: Mild TTP paraspinal, C3 RRSL Rib: Right elevated first rib with TTP Thoracic: Mild TTP paraspinal, T4-8 RRSL Lumbar: Mild TTP paraspinal, L1-3 RRSL Pelvis: Right anterior innominate with out flare  Electronically signed by:  Edwin [name] 10:56 AM 06/05/21

## 2021-06-05 ENCOUNTER — Other Ambulatory Visit: Payer: Self-pay

## 2021-06-05 ENCOUNTER — Ambulatory Visit: Payer: Medicare Other | Admitting: Sports Medicine

## 2021-06-05 VITALS — BP 134/84 | HR 70 | Ht 73.0 in | Wt 245.0 lb

## 2021-06-05 DIAGNOSIS — M9908 Segmental and somatic dysfunction of rib cage: Secondary | ICD-10-CM

## 2021-06-05 DIAGNOSIS — M9903 Segmental and somatic dysfunction of lumbar region: Secondary | ICD-10-CM

## 2021-06-05 DIAGNOSIS — M9902 Segmental and somatic dysfunction of thoracic region: Secondary | ICD-10-CM | POA: Diagnosis not present

## 2021-06-05 DIAGNOSIS — M9905 Segmental and somatic dysfunction of pelvic region: Secondary | ICD-10-CM

## 2021-06-05 DIAGNOSIS — M545 Low back pain, unspecified: Secondary | ICD-10-CM | POA: Diagnosis not present

## 2021-06-05 DIAGNOSIS — M9901 Segmental and somatic dysfunction of cervical region: Secondary | ICD-10-CM | POA: Diagnosis not present

## 2021-06-05 NOTE — Patient Instructions (Signed)
Good to see you   Follow up in  

## 2021-06-06 ENCOUNTER — Other Ambulatory Visit: Payer: Self-pay

## 2021-06-06 DIAGNOSIS — M5416 Radiculopathy, lumbar region: Secondary | ICD-10-CM

## 2021-06-06 NOTE — Telephone Encounter (Signed)
Spoke with patient. Per a verbal from Dr. Tamala Julian patient is to start with epidural. Patient voices understanding.

## 2021-06-08 ENCOUNTER — Ambulatory Visit: Payer: Medicare Other | Admitting: Family Medicine

## 2021-06-09 ENCOUNTER — Other Ambulatory Visit: Payer: Self-pay

## 2021-06-09 ENCOUNTER — Ambulatory Visit
Admission: RE | Admit: 2021-06-09 | Discharge: 2021-06-09 | Disposition: A | Discharge status: Home / Self Care | Payer: Medicare Other | Source: Ambulatory Visit | Attending: Family Medicine | Admitting: Family Medicine

## 2021-06-09 DIAGNOSIS — M5416 Radiculopathy, lumbar region: Secondary | ICD-10-CM

## 2021-06-09 MED ORDER — IOPAMIDOL (ISOVUE-M 200) INJECTION 41%
1.0000 mL | Freq: Once | INTRAMUSCULAR | Status: AC
  Administered 2021-06-09: 1 mL via EPIDURAL

## 2021-06-09 MED ORDER — METHYLPREDNISOLONE ACETATE 40 MG/ML INJ SUSP (RADIOLOG
80.0000 mg | Freq: Once | INTRAMUSCULAR | Status: AC
  Administered 2021-06-09: 80 mg via EPIDURAL

## 2021-06-09 NOTE — Discharge Instructions (Signed)

## 2021-06-22 NOTE — Progress Notes (Signed)
Edwin [name] [address] [place] Phone: [phone] Subjective:   Edwin [name], am serving as a scribe for Dr. Hulan Saas. This visit occurred during the SARS-CoV-2 public health emergency.  Safety protocols were in place, including screening questions prior to the visit, additional usage of staff PPE, and extensive cleaning of exam room while observing appropriate contact time as indicated for disinfecting solutions.   I'm seeing this patient by the request  of:  [name], MD  CC: Back and neck pain follow-up  [id number]  Edwin [name] is a 80 y.o. male coming in with complaint of back and neck pain. OMT 06/05/2021 from Dr. Glennon [name]. Had epidural on 06/09/2021. Patient states pain has greatly decreased since epidural. Wants injection in hips today. Feels like he doesn't need manipulation.  Patient states that it is difficult to sleep on either side.  Describes the pain as a dull, throbbing aching sensation.  Medications patient has been prescribed: None  Taking:         Reviewed prior external information including notes and imaging from previsou exam, outside providers and external EMR if available.   As well as notes that were available from care everywhere and other healthcare systems.  Past medical history, social, surgical and family history all reviewed in electronic medical record.  No pertanent information unless stated regarding to the chief complaint.   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   right lateral deltoid BASAL CELL CARCINOMA, SUPERFICIAL AND NODULAR PATTERNS   BCC (basal cell carcinoma of skin) 04/23/2017   left lat neck, excision RESIDUAL BASAL CELL CARCINOMA, MARGINS FREE   BCC (basal cell carcinoma of skin) 03/13/2017   left lateral neck  BASAL CELL CARCINOMA, NODULAR PATTERN   BCC (basal cell carcinoma of skin) 12/25/2012   left pretibial    BCC (basal cell carcinoma of skin) 05/21/2007   left medial calf - , 0.8 X 0.5CM: SUPERFICIAL BASAL CELL CARCINOMA, BASE INVOLVED   BPH (benign prostatic hyperplasia)    Cancer (HCC)    Prostate Cancer   Dysrhythmia    SVT   Hyperlipidemia    Hypertension    PTSD (post-traumatic stress disorder)    Pulmonary nodules    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 POSITIVE muscle aches  Objective  Blood pressure 140/82, pulse 81, height 6\' 1"  (1.854 m), weight 229 lb (103.9 kg), SpO2 97 %.   General: No apparent distress alert and oriented x3 mood and affect normal, dressed appropriately.  HEENT: Pupils equal, extraocular movements intact  Respiratory: Patient's speak in full sentences and does not appear short of breath  Cardiovascular: No lower extremity edema, non tender, no erythema  Low back still has some tenderness but improved from previous exam.  Positive FABER test bilaterally with severe tenderness to palpation of the greater trochanteric area bilaterally.  Still tightness with straight leg test but no true radicular symptoms.  After verbal consent patient was prepped with alcohol swab and with a 21-gauge 2 inch needle injected with 2 cc of 0.5% Marcaine and 1 cc of Kenalog 40 mg/mL into the right greater trochanteric area.  No blood loss.  Band-Aid placed.  Postinjection instructions given \  After verbal consent patient  was prepped with alcohol swab and with a 21-gauge 2 inch needle injected with 2 cc of 0.5% Marcaine and 1 cc of Kenalog 40 mg/mL into the left greater trochanteric area.  No blood loss.  Band-Aid placed.  Postinjection instructions given        Assessment and Plan:         The above documentation has been reviewed and is accurate and complete Edwin Pulley, DO       Note: This dictation was prepared with Dragon dictation along with smaller phrase technology. Any transcriptional errors that result from this process are unintentional.

## 2021-06-23 ENCOUNTER — Other Ambulatory Visit: Payer: Self-pay

## 2021-06-23 ENCOUNTER — Ambulatory Visit: Payer: Medicare Other | Admitting: Family Medicine

## 2021-06-23 ENCOUNTER — Encounter: Payer: Self-pay | Admitting: Family Medicine

## 2021-06-23 VITALS — BP 140/82 | HR 81 | Ht 73.0 in | Wt 229.0 lb

## 2021-06-23 DIAGNOSIS — M5416 Radiculopathy, lumbar region: Secondary | ICD-10-CM | POA: Diagnosis not present

## 2021-06-23 DIAGNOSIS — M7062 Trochanteric bursitis, left hip: Secondary | ICD-10-CM | POA: Diagnosis not present

## 2021-06-23 DIAGNOSIS — M2550 Pain in unspecified joint: Secondary | ICD-10-CM

## 2021-06-23 DIAGNOSIS — M7061 Trochanteric bursitis, right hip: Secondary | ICD-10-CM | POA: Diagnosis not present

## 2021-06-23 NOTE — Assessment & Plan Note (Signed)
Patient's pain seem to be more over the lateral aspect of the hips bilaterally.  Patient given injections today and tolerated the procedure well.  Discussed icing regimen and home exercises.  Increase activity slowly.  Patient know it is usually doing his bike and I do feel that this would be beneficial for him.  Discussed icing regimen.  Follow-up again in 2 to 3 weeks to start manipulation likely again.

## 2021-06-23 NOTE — Assessment & Plan Note (Signed)
Patient is responded really well to the epidural but was still having greater trochanteric pain bilaterally.  Patient given injections today and tolerated the procedure well.  Expect patient to do relatively well.  Patient has done gabapentin in the past but has not noticed significant improvement at the moment.  Increase activity slowly.  Patient does respond usually well to osteopathic manipulation and will follow-up in 2 to 3 weeks.  Follow-up with me at that time.

## 2021-06-23 NOTE — Patient Instructions (Addendum)
Injections today Labs Today See you again at next appointment

## 2021-07-11 NOTE — Progress Notes (Signed)
[place] Phone: [phone] Subjective:   Edwin Edwin [name], am serving as a scribe for Dr. Hulan Saas.This visit occurred during the SARS-CoV-2 public health emergency.  Safety protocols were in place, including screening questions prior to the visit, additional usage of staff PPE, and extensive cleaning of exam room while observing appropriate contact time as indicated for disinfecting solutions.  I'm seeing this patient by the request  of:  [name], MD  CC: Back and neck pain follow-up  [id number]  Edwin Edwin [name] is a 80 y.o. male coming in with complaint of back and neck pain. OMT on 06/23/2021. Patient states that he has been having L pec pain for a while. Does have pain that can radiate down his arm. Epidural 06/09/2021.   Patient would also like bilateral hip injections.   Medications patient has been prescribed: None          Reviewed prior external information including notes and imaging from previsou exam, outside providers and external EMR if available.   As well as notes that were available from care everywhere and other healthcare systems.  Past medical history, social, surgical and family history all reviewed in electronic medical record.  Edwin [name] pertanent information unless stated regarding to the chief complaint.   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   right lateral deltoid BASAL CELL CARCINOMA, SUPERFICIAL AND NODULAR PATTERNS   BCC (basal cell carcinoma of skin) 04/23/2017   left lat neck, excision RESIDUAL BASAL CELL CARCINOMA, MARGINS FREE   BCC (basal cell carcinoma of skin) 03/13/2017   left lateral neck  BASAL CELL CARCINOMA, NODULAR PATTERN   BCC (basal cell carcinoma of skin) 12/25/2012   left pretibial   BCC (basal cell carcinoma of skin) 05/21/2007   left medial calf - , 0.8 X 0.5CM: SUPERFICIAL BASAL CELL CARCINOMA,  BASE INVOLVED   BPH (benign prostatic hyperplasia)    Cancer (HCC)    Prostate Cancer   Dysrhythmia    SVT   Hyperlipidemia    Hypertension    PTSD (post-traumatic stress disorder)    Pulmonary nodules    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 POSITIVE muscle aches  Objective  Blood pressure 130/80, pulse 81, height 6\' 1"  (1.854 m), weight 232 lb (105.2 kg), SpO2 98 %.   General: Edwin [name] apparent distress alert and oriented x3 mood and affect normal, dressed appropriately.  HEENT: Pupils equal, extraocular movements intact  Respiratory: Patient's speak in full sentences and does not appear short of breath  Cardiovascular: Edwin [name] lower extremity edema, non tender, Edwin [name] erythema  Patient back exam does have some loss of lordosis.  Some tenderness to palpation of the paraspinal musculature.  Patient does still have some tightness noted with FABER test.  Neurovascular intact distally.  Patient chest wall is tender to palpation.  Patient does have tightness of the pectoral tendon noted.  Edwin [name] true masses  appreciated in the area.   Osteopathic findings  C2 flexed rotated and side bent right C6 flexed rotated and side bent left T3 extended rotated and side bent right inhaled rib T9 extended rotated and side bent left L2 flexed rotated and side bent right Sacrum right on right       Assessment and Plan:  Lumbar radiculopathy Patient  did have some difficulty with the injection but since he has had it he has noticed improvement.  Not having as much pain in the back.  Has noticed some more achiness in the legs but feels like it secondary to him increasing activity.  Patient denies any long-term ramifications.  We discussed posture and ergonomics.  Will monitor for any more of the hip bursitis.  Follow-up with me again in 6 to 8 weeks.  Left-sided chest wall pain Does appear to be musculature in nature.  Discussed that the existing have any association with shortness of breath, chest pain that is out of the ordinary or crushing, sweating the patient needs to seek medical attention immediately.  Increase activity slowly.  Follow-up with me again in 6 to 8 weeks.   Nonallopathic problems  Decision today to treat with OMT was based on Physical Exam  After verbal consent patient was treated with HVLA, ME, FPR techniques in cervical, rib, thoracic, lumbar, and sacral  areas  Patient tolerated the procedure well with improvement in symptoms  Patient given exercises, stretches and lifestyle modifications  See medications in patient instructions if given  Patient will follow up in 4-8 weeks      The above documentation has been reviewed and is accurate and complete Edwin Pulley, DO       Note: This dictation was prepared with Dragon dictation along with smaller phrase technology. Any transcriptional errors that result from this process are unintentional.

## 2021-07-12 ENCOUNTER — Other Ambulatory Visit: Payer: Self-pay

## 2021-07-12 ENCOUNTER — Encounter: Payer: Self-pay | Admitting: Family Medicine

## 2021-07-12 ENCOUNTER — Ambulatory Visit: Payer: Medicare Other | Admitting: Family Medicine

## 2021-07-12 VITALS — BP 130/80 | HR 81 | Ht 73.0 in | Wt 232.0 lb

## 2021-07-12 DIAGNOSIS — R0789 Other chest pain: Secondary | ICD-10-CM

## 2021-07-12 DIAGNOSIS — M9908 Segmental and somatic dysfunction of rib cage: Secondary | ICD-10-CM | POA: Diagnosis not present

## 2021-07-12 DIAGNOSIS — M9902 Segmental and somatic dysfunction of thoracic region: Secondary | ICD-10-CM

## 2021-07-12 DIAGNOSIS — M5416 Radiculopathy, lumbar region: Secondary | ICD-10-CM

## 2021-07-12 DIAGNOSIS — M9903 Segmental and somatic dysfunction of lumbar region: Secondary | ICD-10-CM

## 2021-07-12 DIAGNOSIS — M9901 Segmental and somatic dysfunction of cervical region: Secondary | ICD-10-CM

## 2021-07-12 DIAGNOSIS — M9904 Segmental and somatic dysfunction of sacral region: Secondary | ICD-10-CM

## 2021-07-12 DIAGNOSIS — M2550 Pain in unspecified joint: Secondary | ICD-10-CM | POA: Diagnosis not present

## 2021-07-12 LAB — URINALYSIS
Bilirubin Urine: NEGATIVE
Hgb urine dipstick: NEGATIVE
Ketones, ur: NEGATIVE
Leukocytes,Ua: NEGATIVE
Nitrite: NEGATIVE
Specific Gravity, Urine: >=1.03 — AB (ref 1.000–1.030)
Total Protein, Urine: NEGATIVE
Urine Glucose: NEGATIVE
Urobilinogen, UA: 0.2 (ref 0.0–1.0)
pH: 6 (ref 5.0–8.0)

## 2021-07-12 NOTE — Assessment & Plan Note (Signed)
Patient did have some difficulty with the injection but since he has had it he has noticed improvement.  Not having as much pain in the back.  Has noticed some more achiness in the legs but feels like it secondary to him increasing activity.  Patient denies any long-term ramifications.  We discussed posture and ergonomics.  Will monitor for any more of the hip bursitis.  Follow-up with me again in 6 to 8 weeks.

## 2021-07-12 NOTE — Patient Instructions (Signed)
See me again in 6 weeks 

## 2021-07-12 NOTE — Assessment & Plan Note (Signed)
Does appear to be musculature in nature.  Discussed that the existing have any association with shortness of breath, chest pain that is out of the ordinary or crushing, sweating the patient needs to seek medical attention immediately.  Increase activity slowly.  Follow-up with me again in 6 to 8 weeks.

## 2021-08-18 ENCOUNTER — Other Ambulatory Visit: Payer: Self-pay

## 2021-08-18 MED ORDER — DULOXETINE HCL 20 MG PO CPEP: 20.0000 mg | ORAL_CAPSULE | Freq: Every day | ORAL | 0 refills | Status: DC

## 2021-08-23 NOTE — Progress Notes (Signed)
[name] [place] Phone: [phone] Subjective:   Edwin [name], am serving as a scribe for Dr. Hulan Saas.  I'm seeing this patient by the request  of:  Edwin Body, MD  CC: back and neck pain   [id number]  Edwin [name] is a 81 y.o. male coming in with complaint of back and neck pain. OMT on 07/12/2021. Patient states doing well ready for OMT. Lots of stress and working a lot, just started rowing again   Medications patient has been prescribed: Cymbalta  Taking: yes but only for days          Reviewed prior external information including notes and imaging from previsou exam, outside providers and external EMR if available.   As well as notes that were available from care everywhere and other healthcare systems.  Past medical history, social, surgical and family history all reviewed in electronic medical record.  No pertanent information unless stated regarding to the chief complaint.   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   right lateral deltoid BASAL CELL CARCINOMA, SUPERFICIAL AND NODULAR PATTERNS   BCC (basal cell carcinoma of skin) 04/23/2017   left lat neck, excision RESIDUAL BASAL CELL CARCINOMA, MARGINS FREE   BCC (basal cell carcinoma of skin) 03/13/2017   left lateral neck  BASAL CELL CARCINOMA, NODULAR PATTERN   BCC (basal cell carcinoma of skin) 12/25/2012   left pretibial   BCC (basal cell carcinoma of skin) 05/21/2007   left medial calf - , 0.8 X 0.5CM: SUPERFICIAL BASAL CELL CARCINOMA, BASE INVOLVED   BPH (benign prostatic hyperplasia)    Cancer (HCC)    Prostate Cancer   Dysrhythmia    SVT   Hyperlipidemia    Hypertension    PTSD (post-traumatic stress disorder)    Pulmonary nodules    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 POSITIVE muscle aches  Objective  Blood pressure 126/82, pulse 73, height 6\' 1"  (1.854 m), SpO2 97 %.   General: No apparent distress alert and oriented x3 mood and affect normal, dressed appropriately.  HEENT: Pupils equal, extraocular movements intact  Respiratory: Patient's speak in full sentences and does not appear short of breath  Cardiovascular: No lower extremity edema, non tender, no erythema  Significant tightness noted of the lumbar spine Tightness of the FABER  Negative SLT    Osteopathic findings  C2 flexed rotated and side bent right C7 flexed rotated and side bent left T3 extended rotated and side bent right inhaled rib T8 extended rotated and side bent left L2 flexed rotated and side bent right Sacrum right on right      Assessment and Plan:  Lumbar radiculopathy Tightness noted discussed HEP  Patient is to continue to stay active otherwise.  Discussed which activities to do which wants to avoid.  Patient does have some underlying stress that could be contributing and encouraged him to  continue with the Cymbalta.  We will discuss potentially increasing at follow-up if necessary.  Follow-up again in 6 weeks    Nonallopathic problems  Decision today to treat with OMT was based on Physical Exam  After verbal consent patient was treated with HVLA, ME, FPR techniques in cervical, rib, thoracic, lumbar, and sacral  areas  Patient tolerated the procedure well with improvement in symptoms  Patient  given exercises, stretches and lifestyle modifications  See medications in patient instructions if given  Patient will follow up in 4-8 weeks      The above documentation has been reviewed and is accurate and complete Edwin Pulley, DO       Note: This dictation was prepared with Dragon dictation along with smaller phrase technology. Any transcriptional errors that result from this process are unintentional.

## 2021-08-24 ENCOUNTER — Other Ambulatory Visit: Payer: Self-pay

## 2021-08-24 ENCOUNTER — Ambulatory Visit: Payer: Medicare Other | Admitting: Family Medicine

## 2021-08-24 VITALS — BP 126/82 | HR 73 | Ht 73.0 in

## 2021-08-24 DIAGNOSIS — M9903 Segmental and somatic dysfunction of lumbar region: Secondary | ICD-10-CM | POA: Diagnosis not present

## 2021-08-24 DIAGNOSIS — M9902 Segmental and somatic dysfunction of thoracic region: Secondary | ICD-10-CM | POA: Diagnosis not present

## 2021-08-24 DIAGNOSIS — M9904 Segmental and somatic dysfunction of sacral region: Secondary | ICD-10-CM | POA: Diagnosis not present

## 2021-08-24 DIAGNOSIS — M9908 Segmental and somatic dysfunction of rib cage: Secondary | ICD-10-CM

## 2021-08-24 DIAGNOSIS — M5416 Radiculopathy, lumbar region: Secondary | ICD-10-CM | POA: Diagnosis not present

## 2021-08-24 DIAGNOSIS — M9901 Segmental and somatic dysfunction of cervical region: Secondary | ICD-10-CM | POA: Diagnosis not present

## 2021-08-24 NOTE — Assessment & Plan Note (Signed)
Tightness noted discussed HEP  Patient is to continue to stay active otherwise.  Discussed which activities to do which wants to avoid.  Patient does have some underlying stress that could be contributing and encouraged him to continue with the Cymbalta.  We will discuss potentially increasing at follow-up if necessary.  Follow-up again in 6 weeks

## 2021-08-24 NOTE — Patient Instructions (Addendum)
Good to see you  Keep trying the Cymbalta to see how you do Follow up in 5-6 weeks

## 2021-09-14 ENCOUNTER — Ambulatory Visit: Payer: Medicare Other | Admitting: Dermatology

## 2021-09-21 ENCOUNTER — Ambulatory Visit: Payer: Medicare Other | Admitting: Dermatology

## 2021-09-27 NOTE — Progress Notes (Signed)
?[name] D.O. ?[facility] ?[place] ?Phone: [phone] ?Subjective:   ?I, Vilma Meckel, am serving as a Education administrator for Dr. Hulan Saas. ?This visit occurred during the SARS-CoV-2 public health emergency.  Safety protocols were in place, including screening questions prior to the visit, additional usage of staff PPE, and extensive cleaning of exam room while observing appropriate contact time as indicated for disinfecting solutions.  ? ?I'm seeing this patient by the request  of:  [name], MD ? ?CC: Low back pain follow-up ? ?[id number]  ?FARREL GUIMOND is a 81 y.o. male coming in with complaint of back and neck pain. OMT on 2/2/203. Patient states same per usual. No new complaints. ? ?Medications patient has been prescribed: Cymbalta ? 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 ?  ? ? ? ? ?Reviewed prior external information including notes and imaging from previsou exam, outside providers and external EMR if available.  ? ?As well as notes that were available from care everywhere and other healthcare systems. ? ?Past medical history, social, surgical and family history all reviewed in electronic medical record.  No pertanent information unless stated regarding to the chief complaint.  ? ?Past Medical History:  ?Diagnosis Date  ? 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 right lateral deltoid BASAL CELL CARCINOMA, SUPERFICIAL AND NODULAR PATTERNS  ? 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 left lat neck, excision RESIDUAL BASAL CELL CARCINOMA, MARGINS FREE  ? BCC (basal cell carcinoma of skin) 03/13/2017  ? left lateral neck   BASAL CELL CARCINOMA, NODULAR PATTERN  ? BCC (basal cell carcinoma of skin) 12/25/2012  ? left pretibial  ? BCC (basal cell carcinoma of skin) 05/21/2007  ? left medial calf - , 0.8 X 0.5CM: SUPERFICIAL BASAL CELL CARCINOMA, BASE INVOLVED  ? BPH (benign prostatic hyperplasia)   ? Cancer [facility])   ? Prostate Cancer  ? Dysrhythmia   ? SVT  ? Hyperlipidemia   ? Hypertension   ? PTSD (post-traumatic stress disorder)   ? Pulmonary nodules   ? 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 right inferior cheek mandible SQUAMOUS CELL CARCINOMA IN SITU  ?  ?Allergies  ?Allergen Reactions  ? Amoxicillin-Pot Clavulanate Diarrhea  ? Bacitracin-Neomycin-Polymyxin Hives  ? Hydrochlorothiazide   ? Lipitor [Atorvastatin]   ? Lisinopril   ? Neosporin [Neomycin-Bacitracin Zn-Polymyx]   ? ? ? ?Review of Systems: ? No headache, visual changes, nausea, vomiting, diarrhea, constipation, dizziness, abdominal pain, skin rash, fevers, chills, night sweats, weight loss, swollen lymph nodes, body aches, joint swelling, chest pain, shortness of breath, mood changes. POSITIVE muscle aches ? ?Objective  ?Blood pressure 118/84, pulse 62, height '6\' 1"'$  (1.854 m), weight 232 lb (105.2 kg), SpO2 97 %. ?  ?General: No apparent distress alert and oriented x3 mood and affect normal, dressed appropriately.  ?HEENT: Pupils equal, extraocular movements intact  ?Respiratory: Patient's speak in full sentences and does not appear short of breath  ?Cardiovascular: No lower extremity edema, non tender, no erythema  ?Low back exam does have significant loss of lordosis.  Patient does have tenderness to palpation in the paraspinal musculature.  Tightness noted  in all range of motion. ? ?Osteopathic findings ? ?C6 flexed rotated and side bent left ?T3 extended rotated and side bent right inhaled rib ?T6 extended rotated and side bent left ?L2 flexed rotated and side bent right ?Sacrum left on left ? ? ? ? ?  ?Assessment and Plan: ? ?Lumbar radiculopathy ?Patient  does have tenderness noted.  Discussed which activities to do which ones to avoid.  We discussed the Cymbalta again at great length.  Patient is still on the 20 mg.  We will continue to try.  May need to increase depending on how patient does.  Increase activity otherwise.  Follow-up again in 6 to 8 weeks ?  ? ?Nonallopathic problems ? ?Decision today to treat with OMT was based on Physical Exam ? ?After verbal consent patient was treated with HVLA, ME, FPR techniques in cervical, rib, thoracic, lumbar, and sacral  areas ? ?Patient tolerated the procedure well with improvement in symptoms ? ?Patient given exercises, stretches and lifestyle modifications ? ?See medications in patient instructions if given ? ?Patient will follow up in 4-8 weeks ? ?  ? ? ?The above documentation has been reviewed and is accurate and complete [name], DO ? ? ? ?  ? ? Note: This dictation was prepared with Dragon dictation along with smaller phrase technology. Any transcriptional errors that result from this process are unintentional.    ?  ?  ? ?

## 2021-09-28 ENCOUNTER — Ambulatory Visit: Payer: Medicare Other | Admitting: Family Medicine

## 2021-09-28 ENCOUNTER — Other Ambulatory Visit: Payer: Self-pay

## 2021-09-28 VITALS — BP 118/84 | HR 62 | Ht 73.0 in | Wt 232.0 lb

## 2021-09-28 DIAGNOSIS — M5416 Radiculopathy, lumbar region: Secondary | ICD-10-CM

## 2021-09-28 DIAGNOSIS — M9908 Segmental and somatic dysfunction of rib cage: Secondary | ICD-10-CM

## 2021-09-28 DIAGNOSIS — M9903 Segmental and somatic dysfunction of lumbar region: Secondary | ICD-10-CM

## 2021-09-28 DIAGNOSIS — M9901 Segmental and somatic dysfunction of cervical region: Secondary | ICD-10-CM

## 2021-09-28 DIAGNOSIS — M9902 Segmental and somatic dysfunction of thoracic region: Secondary | ICD-10-CM

## 2021-09-28 DIAGNOSIS — M9904 Segmental and somatic dysfunction of sacral region: Secondary | ICD-10-CM

## 2021-09-28 NOTE — Patient Instructions (Signed)
See you again in 6 weeks  

## 2021-09-29 NOTE — Assessment & Plan Note (Signed)
Patient does have tenderness noted.  Discussed which activities to do which ones to avoid.  We discussed the Cymbalta again at great length.  Patient is still on the 20 mg.  We will continue to try.  May need to increase depending on how patient does.  Increase activity otherwise.  Follow-up again in 6 to 8 weeks ?

## 2021-10-09 ENCOUNTER — Ambulatory Visit: Payer: Medicare Other | Admitting: Dermatology

## 2021-10-31 NOTE — Progress Notes (Signed)
?[name] D.O. ?[facility] ?[place] ?Phone: [phone] ?Subjective:   ?I, [name], am serving as a scribe for Dr. Hulan Saas. ? ?I'm seeing this patient by the request  of:  [name], MD ? ?CC: Low back pain follow-up ? ?[id number]  ?Edwin [name] is a 81 y.o. male coming in with complaint of back and neck pain. OMT 09/28/2021. Patient states tat his ribs are the issue today. ? ? ?Medications patient has been prescribed: None ? ?Taking: ? ? ?Patient was recently seen at [facility] for malignant neoplasm history.  Patient did have a CT of the abdomen done which did show a retrocecal nodule adjacent to the appendix with interval increase in size from a 2020 CT ? ?  ?Last PSA was three 1 month ago ? ? ? ?Reviewed prior external information including notes and imaging from previsou exam, outside providers and external EMR if available.  ? ?As well as notes that were available from care everywhere and other healthcare systems. ? ?Past medical history, social, surgical and family history all reviewed in electronic medical record.  No pertanent information unless stated regarding to the chief complaint.  ? ?Past Medical History:  ?Diagnosis Date  ? 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 right lateral deltoid BASAL CELL CARCINOMA, SUPERFICIAL AND NODULAR PATTERNS  ? 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 left lat neck, excision RESIDUAL BASAL CELL CARCINOMA, MARGINS FREE  ? BCC (basal cell carcinoma of skin) 03/13/2017  ? left lateral neck  BASAL CELL CARCINOMA, NODULAR PATTERN  ? BCC (basal cell carcinoma of skin) 12/25/2012  ? left pretibial  ? BCC (basal cell carcinoma of skin) 05/21/2007  ? left medial calf - , 0.8 X 0.5CM: SUPERFICIAL BASAL CELL CARCINOMA, BASE INVOLVED  ? BPH (benign prostatic hyperplasia)   ? [facility])   ? Prostate Cancer  ? Dysrhythmia   ? SVT  ? Hyperlipidemia   ? Hypertension   ? PTSD (post-traumatic  stress disorder)   ? Pulmonary nodules   ? 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 right inferior cheek mandible SQUAMOUS CELL CARCINOMA IN SITU  ?  ?Allergies  ?Allergen Reactions  ? Amoxicillin-Pot Clavulanate Diarrhea  ? Bacitracin-Neomycin-Polymyxin Hives  ? Hydrochlorothiazide   ? Lipitor [Atorvastatin]   ? Lisinopril   ? Neosporin [Neomycin-Bacitracin Zn-Polymyx]   ? ? ? ?Review of Systems: ? No headache, visual changes, nausea, vomiting, diarrhea, constipation, dizziness, abdominal pain, skin rash, fevers, chills, night sweats, weight loss, swollen lymph nodes, body aches, joint swelling, chest pain, shortness of breath, mood changes. POSITIVE muscle aches ? ?Objective  ?Blood pressure 120/82, pulse 86, height '6\' 1"'$  (1.854 m), weight 234 lb (106.1 kg), SpO2 98 %. ?  ?General: No apparent distress alert and oriented x3 mood and affect normal, dressed appropriately.  ?HEENT: Pupils equal, extraocular movements intact  ?Respiratory: Patient's speak in full sentences and does not appear short of breath  ?Cardiovascular: No lower extremity edema, non tender, no erythema  ?Continues to have significant loss of lordosis.  Difficulty with anything greater than 10 degrees of extension.  Does have tightness of the hamstrings significantly.  No radicular symptoms at the moment though.  Difficulty with [name] secondary to tightness. ? ?Osteopathic findings ? ?C2 flexed rotated and side bent right ?C6 flexed rotated and side bent left ?T3 extended rotated and side bent right inhaled rib ?T9 extended rotated and side bent left ?L2  flexed rotated and side bent right ?Sacrum right on right ? ? ? ? ?  ?Assessment and Plan: ? ?Lumbar radiculopathy ?Tightness noted, Exacerbation of chronic condition, injections today per orders, discussed core strength and HPE  ?RTC in 6-8 weeks.  ?  ? ?Nonallopathic problems ? ?Decision today to treat with OMT was based on Physical Exam ? ?After verbal consent patient was treated with  HVLA, ME, techniques in cervical, rib, thoracic, lumbar, and sacral  areas ? ?Patient tolerated the procedure well with improvement in symptoms ? ?Patient given exercises, stretches and lifestyle modifications ? ?See medications in patient instructions if given ? ?Patient will follow up in 4-8 weeks ? ?  ? ? ?The above documentation has been reviewed and is accurate and complete [name], DO ? ? ? ?  ? ? Note: This dictation was prepared with Dragon dictation along with smaller phrase technology. Any transcriptional errors that result from this process are unintentional.    ?  ?  ? ?

## 2021-11-01 ENCOUNTER — Ambulatory Visit: Payer: Medicare Other | Admitting: Family Medicine

## 2021-11-01 VITALS — BP 120/82 | HR 86 | Ht 73.0 in | Wt 234.0 lb

## 2021-11-01 DIAGNOSIS — M9904 Segmental and somatic dysfunction of sacral region: Secondary | ICD-10-CM | POA: Diagnosis not present

## 2021-11-01 DIAGNOSIS — M9901 Segmental and somatic dysfunction of cervical region: Secondary | ICD-10-CM | POA: Diagnosis not present

## 2021-11-01 DIAGNOSIS — M9908 Segmental and somatic dysfunction of rib cage: Secondary | ICD-10-CM

## 2021-11-01 DIAGNOSIS — M9903 Segmental and somatic dysfunction of lumbar region: Secondary | ICD-10-CM

## 2021-11-01 DIAGNOSIS — M9902 Segmental and somatic dysfunction of thoracic region: Secondary | ICD-10-CM

## 2021-11-01 DIAGNOSIS — M5416 Radiculopathy, lumbar region: Secondary | ICD-10-CM

## 2021-11-01 MED ORDER — KETOROLAC TROMETHAMINE 30 MG/ML IJ SOLN
30.0000 mg | Freq: Once | INTRAMUSCULAR | Status: AC
  Administered 2021-11-01: 30 mg via INTRAMUSCULAR

## 2021-11-01 MED ORDER — METHYLPREDNISOLONE ACETATE 40 MG/ML IJ SUSP
40.0000 mg | Freq: Once | INTRAMUSCULAR | Status: AC
  Administered 2021-11-01: 40 mg via INTRAMUSCULAR

## 2021-11-01 NOTE — Patient Instructions (Addendum)
Good to see you  ?Injection given today  ?Follow up in 5-6 weeks  ?

## 2021-11-02 NOTE — Assessment & Plan Note (Signed)
Tightness noted, Exacerbation of chronic condition, injections today per orders, discussed core strength and HPE  ?RTC in 6-8 weeks.  ?

## 2021-11-20 ENCOUNTER — Ambulatory Visit: Payer: Medicare Other | Admitting: Dermatology

## 2021-11-30 ENCOUNTER — Other Ambulatory Visit: Payer: Self-pay | Admitting: Internal Medicine

## 2021-11-30 DIAGNOSIS — I2510 Atherosclerotic heart disease of native coronary artery without angina pectoris: Secondary | ICD-10-CM

## 2021-12-07 ENCOUNTER — Inpatient Hospital Stay: Admission: RE | Admit: 2021-12-07 | Payer: Medicare Other | Source: Ambulatory Visit

## 2021-12-13 ENCOUNTER — Ambulatory Visit: Payer: Medicare Other | Admitting: Family Medicine

## 2021-12-28 ENCOUNTER — Ambulatory Visit: Payer: Medicare Other

## 2022-01-10 ENCOUNTER — Telehealth (HOSPITAL_COMMUNITY): Payer: Self-pay | Admitting: Emergency Medicine

## 2022-01-10 ENCOUNTER — Encounter (HOSPITAL_COMMUNITY): Payer: Self-pay

## 2022-01-10 DIAGNOSIS — R079 Chest pain, unspecified: Secondary | ICD-10-CM

## 2022-01-10 MED ORDER — METOPROLOL TARTRATE 100 MG PO TABS: 100.0000 mg | ORAL_TABLET | Freq: Once | ORAL | 0 refills | Status: DC

## 2022-01-10 MED ORDER — IVABRADINE HCL 5 MG PO TABS: 15.0000 mg | ORAL_TABLET | Freq: Once | ORAL | 0 refills | Status: AC

## 2022-01-10 NOTE — Telephone Encounter (Signed)
Reaching out to patient to offer assistance regarding upcoming cardiac imaging study; pt verbalizes understanding of appt date/time, parking situation and where to check in, pre-test NPO status and medications ordered, and verified current allergies; name and call back number provided for further questions should they arise Edwin Bond RN Navigator Cardiac Imaging [facility] [phone] office [phone] cell  '100mg'$  metoprolol + '15mg'$  ivabradine

## 2022-01-11 ENCOUNTER — Ambulatory Visit: Admission: RE | Admit: 2022-01-11 | Payer: Medicare Other | Source: Ambulatory Visit

## 2022-01-11 NOTE — Progress Notes (Signed)
[place] Phone: [phone] Subjective:   [name], am serving as a scribe for Dr. Hulan Saas.   I'm seeing this patient by the request  of:  Edwin Body, MD  CC: Back and neck pain  [id number]  Edwin [name] is a 81 y.o. male coming in with complaint of back and neck pain. OMT on 11/01/2021. Patient states he is here for a tune up and is all good   Medications patient has been prescribed: Cymbalta  Taking:         Reviewed prior external information including notes and imaging from previsou exam, outside providers and external EMR if available.  This includes patient being treated for malignant neoplasm of the prostate undergoing radiation at [facility] patient is scheduled to have his PSA checked again in July  As well as notes that were available from care everywhere and other healthcare systems.  Past medical history, social, surgical and family history all reviewed in electronic medical record.  No pertanent information unless stated regarding to the chief complaint.   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   right lateral deltoid BASAL CELL CARCINOMA, SUPERFICIAL AND NODULAR PATTERNS   BCC (basal cell carcinoma of skin) 04/23/2017   left lat neck, excision RESIDUAL BASAL CELL CARCINOMA, MARGINS FREE   BCC (basal cell carcinoma of skin) 03/13/2017   left lateral neck  BASAL CELL CARCINOMA, NODULAR PATTERN   BCC (basal cell carcinoma of skin) 12/25/2012   left pretibial   BCC (basal cell carcinoma of skin) 05/21/2007   left medial calf - , 0.8 X 0.5CM: SUPERFICIAL BASAL CELL CARCINOMA, BASE INVOLVED   BPH (benign prostatic hyperplasia)    Cancer (HCC)    Prostate Cancer   Dysrhythmia    SVT   Hyperlipidemia    Hypertension    PTSD (post-traumatic stress disorder)    Pulmonary nodules    Squamous cell carcinoma in situ  03/13/2017   right inferior cheek mandible SQUAMOUS CELL CARCINOMA IN SITU    Allergies  Allergen Reactions   Amoxicillin-Pot Clavulanate Diarrhea   Bacitracin-Neomycin-Polymyxin Hives   Hydrochlorothiazide    Lipitor [Atorvastatin]    Lisinopril    Neosporin [Neomycin-Bacitracin Zn-Polymyx]      Review of Systems:  No headache, visual changes, nausea, vomiting, diarrhea, constipation, dizziness, abdominal pain, skin rash, fevers, chills, night sweats, weight loss, swollen lymph nodes, [name] aches, joint swelling, chest pain, shortness of breath, mood changes. POSITIVE muscle aches  Objective  Blood pressure 122/84, pulse 75, height '6\' 1"'$  (1.854 m), weight 234 lb (106.1 kg), SpO2 94 %.   General: No apparent distress alert and oriented x3 mood and affect normal, dressed appropriately.  HEENT: Pupils equal, extraocular movements intact  Respiratory: Patient's speak in full sentences and does not appear short of breath  Cardiovascular: No lower extremity edema, non tender, no erythema  Gait MSK:  Back low back does have significant loss of lordosis still noted.  Positive FABER test bilaterally.  The patient has negative straight leg test.  He only has 5 degrees of extension of the back though noted.  Osteopathic findings  C5 flexed rotated and side bent right T5 extended rotated and side bent right inhaled rib L1 flexed rotated and side bent right L3 flexed rotated and side bent left  sacrum right on right       Assessment and Plan:  Lumbar radiculopathy Has had difficulty with the spinal stenosis and lumbar radiculopathy.  Recently being treated also for prostate cancer.  Has undergone radiation.  No other new medications at the moment.  Discussed home exercises, discussed increasing activity slowly.  Follow-up again 6 to 8 weeks    Nonallopathic problems  Decision today to treat with OMT was based on Physical Exam  After verbal consent patient was treated with HVLA,  ME, FPR techniques in cervical, rib, thoracic, lumbar, and sacral  areas  Patient tolerated the procedure well with improvement in symptoms  Patient given exercises, stretches and lifestyle modifications  See medications in patient instructions if given  Patient will follow up in 4-8 weeks    The above documentation has been reviewed and is accurate and complete [name], DO          Note: This dictation was prepared with Dragon dictation along with smaller phrase technology. Any transcriptional errors that result from this process are unintentional.

## 2022-01-17 ENCOUNTER — Ambulatory Visit: Payer: Medicare Other | Admitting: Family Medicine

## 2022-01-17 VITALS — BP 122/84 | HR 75 | Ht 73.0 in | Wt 234.0 lb

## 2022-01-17 DIAGNOSIS — M9904 Segmental and somatic dysfunction of sacral region: Secondary | ICD-10-CM

## 2022-01-17 DIAGNOSIS — M9901 Segmental and somatic dysfunction of cervical region: Secondary | ICD-10-CM | POA: Diagnosis not present

## 2022-01-17 DIAGNOSIS — M9902 Segmental and somatic dysfunction of thoracic region: Secondary | ICD-10-CM | POA: Diagnosis not present

## 2022-01-17 DIAGNOSIS — M9908 Segmental and somatic dysfunction of rib cage: Secondary | ICD-10-CM | POA: Diagnosis not present

## 2022-01-17 DIAGNOSIS — M9903 Segmental and somatic dysfunction of lumbar region: Secondary | ICD-10-CM

## 2022-01-17 DIAGNOSIS — M5416 Radiculopathy, lumbar region: Secondary | ICD-10-CM | POA: Diagnosis not present

## 2022-01-17 NOTE — Patient Instructions (Signed)
Glad you didn't stay in [place] See you in 5-6 weeks

## 2022-01-19 NOTE — Assessment & Plan Note (Signed)
Has had difficulty with the spinal stenosis and lumbar radiculopathy.  Recently being treated also for prostate cancer.  Has undergone radiation.  No other new medications at the moment.  Discussed home exercises, discussed increasing activity slowly.  Follow-up again 6 to 8 weeks

## 2022-01-31 ENCOUNTER — Other Ambulatory Visit: Payer: Self-pay

## 2022-01-31 MED ORDER — PREDNISONE 50 MG PO TABS: ORAL_TABLET | ORAL | 0 refills | Status: DC

## 2022-02-04 ENCOUNTER — Encounter: Payer: Self-pay | Admitting: Family Medicine

## 2022-02-08 ENCOUNTER — Encounter: Payer: Self-pay | Admitting: Family Medicine

## 2022-02-08 ENCOUNTER — Ambulatory Visit (INDEPENDENT_AMBULATORY_CARE_PROVIDER_SITE_OTHER): Payer: Medicare Other

## 2022-02-08 ENCOUNTER — Ambulatory Visit: Payer: Medicare Other | Admitting: Family Medicine

## 2022-02-08 VITALS — BP 132/84 | HR 81 | Ht 73.0 in | Wt 223.0 lb

## 2022-02-08 DIAGNOSIS — M25551 Pain in right hip: Secondary | ICD-10-CM | POA: Diagnosis not present

## 2022-02-08 DIAGNOSIS — M1611 Unilateral primary osteoarthritis, right hip: Secondary | ICD-10-CM | POA: Diagnosis not present

## 2022-02-08 MED ORDER — TRAMADOL HCL 50 MG PO TABS: 50.0000 mg | ORAL_TABLET | Freq: Three times a day (TID) | ORAL | 0 refills | Status: AC | PRN

## 2022-02-08 NOTE — Addendum Note (Signed)
Addended by: [name] on: 02/08/2022 12:25 PM   Modules accepted: Orders

## 2022-02-08 NOTE — Patient Instructions (Addendum)
Referral to Dr. Rhona Raider See you at your next appt

## 2022-02-08 NOTE — Progress Notes (Signed)
[place] Phone: [phone] Subjective:   [name], am serving as a scribe for Dr. Hulan Saas.   I'm seeing this patient by the request  of:  [name], MD  CC: hip pain   [id number]  PLUMER MITTELSTAEDT is a 81 y.o. male coming in with complaint of R hip pain. Patient states that for past 2 months since having radiation treatment has has been having R hip pain over the hip flexor and groin. Has had multiple massages that do help his pain but the pain returns once muscles tighten up. Also used prednisone which helped only while he was taking it. Pain radiating to his knee and he states his calves are twitching more than usual.       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   right lateral deltoid BASAL CELL CARCINOMA, SUPERFICIAL AND NODULAR PATTERNS   BCC (basal cell carcinoma of skin) 04/23/2017   left lat neck, excision RESIDUAL BASAL CELL CARCINOMA, MARGINS FREE   BCC (basal cell carcinoma of skin) 03/13/2017   left lateral neck  BASAL CELL CARCINOMA, NODULAR PATTERN   BCC (basal cell carcinoma of skin) 12/25/2012   left pretibial   BCC (basal cell carcinoma of skin) 05/21/2007   left medial calf - , 0.8 X 0.5CM: SUPERFICIAL BASAL CELL CARCINOMA, BASE INVOLVED   BPH (benign prostatic hyperplasia)    Cancer (HCC)    Prostate Cancer   Dysrhythmia    SVT   Hyperlipidemia    Hypertension    PTSD (post-traumatic stress disorder)    Pulmonary nodules    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   right inferior cheek mandible SQUAMOUS CELL CARCINOMA IN SITU   Past Surgical History:  Procedure Laterality Date   BACK SURGERY     Laminectomy x 3   COLONOSCOPY N/A 02/10/2015   Procedure: COLONOSCOPY;  Surgeon: Manya Silvas, MD;  Location: [facility] ENDOSCOPY;  Service: Endoscopy;  Laterality: N/A;   PROSTATECTOMY     Surgery for Urethral  Stricture     Social History   Socioeconomic History   Marital status: Significant Other    Spouse name: Not on file   Number of children: Not on file   Years of education: Not on file   Highest education level: Not on file  Occupational History   Not on file  Tobacco Use   Smoking status: Former    Packs/day: 0.70    Years: 5.00    Total pack years: 3.50    Types: Cigarettes    Quit date: 07/23/1965    Years since quitting: 56.5   Smokeless tobacco: Never  Substance and Sexual Activity   Alcohol use: No   Drug use: No   Sexual activity: Not on file  Other Topics Concern   Not on file  Social History Narrative   Not on file   Social Determinants of Health   Financial Resource Strain: Not on file  Food Insecurity: Not on file  Transportation Needs: Not on file  Physical Activity: Not on file  Stress: Not on file  Social Connections: Not on file   Allergies  Allergen Reactions   Amoxicillin-Pot Clavulanate Diarrhea   Bacitracin-Neomycin-Polymyxin Hives   Hydrochlorothiazide    Lipitor [Atorvastatin]    Lisinopril    Neosporin [Neomycin-Bacitracin Zn-Polymyx]    No family history on file.  Current Outpatient Medications (Endocrine &  Metabolic):    predniSONE (DELTASONE) 50 MG tablet, Take one tablet daily for the next 5 days.  Current Outpatient Medications (Cardiovascular):    metoprolol tartrate (LOPRESSOR) 100 MG tablet, Take 1 tablet (100 mg total) by mouth once for 1 dose. Please take one time dose '100mg'$  metoprolol tartrate 2 hr prior to cardiac CT for HR control IF HR >55bpm.   valsartan (DIOVAN) 80 MG tablet, Take by mouth.   Current Outpatient Medications (Analgesics):    aspirin EC 81 MG tablet, Take 81 mg by mouth daily.   Current Outpatient Medications (Other):    co-enzyme Q-10 30 MG capsule, Take 30 mg by mouth 3 (three) times daily.   DULoxetine (CYMBALTA) 20 MG capsule, Take 1 capsule (20 mg total) by mouth daily.   Krill Oil 1000 MG CAPS,  Take 2 capsules by mouth daily.   MAGNESIUM GLUCONATE PO, Take 400 mg by mouth at bedtime.   Multiple Vitamin (MULTIVITAMIN) tablet, Take 1 tablet by mouth daily.   Ubiquinol 50 MG CAPS, Take 1 tablet by mouth.   Vitamin D, Ergocalciferol, (DRISDOL) 1.25 MG (50000 UNIT) CAPS capsule, Take 1 capsule (50,000 Units total) by mouth every 7 (seven) days.   Vitamin D, Ergocalciferol, (DRISDOL) 1.25 MG (50000 UNIT) CAPS capsule, Take 1 capsule (50,000 Units total) by mouth every 7 (seven) days.   Reviewed prior external information including notes and imaging from  primary care provider As well as notes that were available from care everywhere and other healthcare systems.  Past medical history, social, surgical and family history all reviewed in electronic medical record.  No pertanent information unless stated regarding to the chief complaint.   Review of Systems:  No headache, visual changes, nausea, vomiting, diarrhea, constipation, dizziness, abdominal pain, skin rash, fevers, chills, night sweats, weight loss, swollen lymph nodes, body aches, joint swelling, chest pain, shortness of breath, mood changes. POSITIVE muscle aches  Objective  Blood pressure 132/84, pulse 81, height '6\' 1"'$  (1.854 m), weight 223 lb (101.2 kg), SpO2 95 %.   General: No apparent distress alert and oriented x3 mood and affect normal, dressed appropriately.  HEENT: Pupils equal, extraocular movements intact  Respiratory: Patient's speak in full sentences and does not appear short of breath  Cardiovascular: No lower extremity edema, non tender, no erythema  Patient is severely antalgic gait with externally rotated leg.  Patient has 0 degrees of internal rotation.  Flexion 45 degrees of the hip before significant discomfort.  Loss of lordosis of the back noted    Impression and Recommendations:    The above documentation has been reviewed and is accurate and complete [name], DO

## 2022-02-08 NOTE — Assessment & Plan Note (Signed)
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  Patient did have an MRI in November 2022 X-rays today do show that patient has progressed fairly significantly from moderate arthritic changes to severe nearly bone-on-bone arthritic changes.  This is affecting all daily activities as well as working out at night.  Patient has intermittent relief with massage.  Patient has undergone radiation and with recent prostate cancer and would like to be more active but is finding it difficult secondary to the hip.  I do believe he would be a good candidate for hip replacement.  Patient is an avid biker and would like to continue to do so long-term we will refer patient to orthopedic surgery.  Total time with patient 33 minutes

## 2022-02-09 ENCOUNTER — Encounter: Payer: Self-pay | Admitting: Family Medicine

## 2022-02-21 ENCOUNTER — Ambulatory Visit
Admission: EM | Admit: 2022-02-21 | Discharge: 2022-02-21 | Disposition: A | Discharge status: Home / Self Care | Payer: Medicare Other

## 2022-02-21 ENCOUNTER — Encounter: Payer: Self-pay | Admitting: Emergency Medicine

## 2022-02-21 DIAGNOSIS — T148XXA Other injury of unspecified body region, initial encounter: Secondary | ICD-10-CM

## 2022-02-21 NOTE — ED Provider Notes (Signed)
Edwin [name]    CSN: [id number] Arrival date & time: 02/21/22  1812      History   Chief Complaint Chief Complaint  Patient presents with   Wound Check    HPI Edwin [name] is a 81 y.o. male.  Patient had a skin tag on his middle chest (in the area near the xiphoid process) removed today at the dermatologist office.  This was around 2:45 PM this afternoon.  Since then it has continued to bleed and has soaked through several Band-Aids.  He tried contacting his dermatologist office but they have not called him back and they are now closed.  Does not take any blood thinners other than a baby aspirin daily.   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   right lateral deltoid BASAL CELL CARCINOMA, SUPERFICIAL AND NODULAR PATTERNS   BCC (basal cell carcinoma of skin) 04/23/2017   left lat neck, excision RESIDUAL BASAL CELL CARCINOMA, MARGINS FREE   BCC (basal cell carcinoma of skin) 03/13/2017   left lateral neck  BASAL CELL CARCINOMA, NODULAR PATTERN   BCC (basal cell carcinoma of skin) 12/25/2012   left pretibial   BCC (basal cell carcinoma of skin) 05/21/2007   left medial calf - , 0.8 X 0.5CM: SUPERFICIAL BASAL CELL CARCINOMA, BASE INVOLVED   BPH (benign prostatic hyperplasia)    Cancer (HCC)    Prostate Cancer   Dysrhythmia    SVT   Hyperlipidemia    Hypertension    PTSD (post-traumatic stress disorder)    Pulmonary nodules    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   right inferior cheek mandible SQUAMOUS CELL CARCINOMA IN SITU    Patient Active Problem List   Diagnosis Date Noted   Arthritis of right hip 02/08/2022   Left-sided chest wall pain 07/12/2021   Greater trochanteric bursitis of both hips 06/23/2021   Nonallopathic lesion of lumbosacral region 06/25/2019   Nonallopathic lesion of rib cage 06/25/2019   COVID-19 virus infection 01/27/2019   HTN (hypertension) 01/27/2019    Hyponatremia 01/27/2019   Class 1 obesity due to excess calories without serious comorbidity with body mass index (BMI) of 30.0 to 30.9 in adult 09/02/2018   History of prostate cancer 09/25/2016   Lumbar radiculopathy 09/25/2016   Pronation deformity of ankle, 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   Nonallopathic lesion of thoracic region 09/25/2016   Nonallopathic lesion of sacral region 09/25/2016   Nonallopathic lesion of cervical region 09/25/2016   Benign essential hypertension 05/19/2015   Hyperlipidemia, mixed 05/19/2015   Malignant neoplasm of prostate ([place]) 08/20/2014   Coronary artery calcification seen on CAT scan 06/03/2014   Supraventricular tachycardia ([place]) 06/03/2014    Past Surgical History:  Procedure Laterality Date   BACK SURGERY     Laminectomy x 3   COLONOSCOPY N/A 02/10/2015   Procedure: COLONOSCOPY;  Surgeon: Manya Silvas, MD;  Location: [facility] ENDOSCOPY;  Service: Endoscopy;  Laterality: N/A;   PROSTATECTOMY     Surgery for Urethral Stricture         Home Medications    Prior to Admission medications   Medication Sig Start Date End Date Taking? Authorizing Provider  aspirin EC 81 MG tablet Take 81 mg by mouth daily.    [provider]  co-enzyme Q-10 30 MG capsule Take 30 mg by mouth 3 (three) times daily.    [provider]  DULoxetine (CYMBALTA) 20 MG capsule Take  1 capsule (20 mg total) by mouth daily. 08/18/21   [name], DO  Krill Oil 1000 MG CAPS Take 2 capsules by mouth daily.    [provider]  MAGNESIUM GLUCONATE PO Take 400 mg by mouth at bedtime.    [provider]  metoprolol tartrate (LOPRESSOR) 100 MG tablet Take 1 tablet (100 mg total) by mouth once for 1 dose. Please take one time dose '100mg'$  metoprolol tartrate 2 hr prior to cardiac CT for HR control IF HR >55bpm. 01/10/22 01/10/22  Kate Sable, MD  Multiple Vitamin (MULTIVITAMIN) tablet Take 1 tablet by mouth daily.    [provider]   predniSONE (DELTASONE) 50 MG tablet Take one tablet daily for the next 5 days. 01/31/22   [name], DO  Ubiquinol 50 MG CAPS Take 1 tablet by mouth.    [provider]  valsartan (DIOVAN) 80 MG tablet Take by mouth.    [provider]  Vitamin D, Ergocalciferol, (DRISDOL) 1.25 MG (50000 UNIT) CAPS capsule Take 1 capsule (50,000 Units total) by mouth every 7 (seven) days. 08/14/19   [name], DO  Vitamin D, Ergocalciferol, (DRISDOL) 1.25 MG (50000 UNIT) CAPS capsule Take 1 capsule (50,000 Units total) by mouth every 7 (seven) days. 04/28/20   [name], DO    Family History History reviewed. No pertinent family history.  Social History Social History   Tobacco Use   Smoking status: Former    Packs/day: 0.70    Years: 5.00    Total pack years: 3.50    Types: Cigarettes    Quit date: 07/23/1965    Years since quitting: 56.6   Smokeless tobacco: Never  Substance Use Topics   Alcohol use: No   Drug use: No     Allergies   Amoxicillin-pot clavulanate, Bacitracin-neomycin-polymyxin, Hydrochlorothiazide, Lipitor [atorvastatin], Lisinopril, and Neosporin [neomycin-bacitracin zn-polymyx]   Review of Systems Review of Systems   Physical Exam Triage Vital Signs ED Triage Vitals  Enc Vitals Group     BP 02/21/22 1832 (!) 168/97     Pulse Rate 02/21/22 1832 74     Resp 02/21/22 1832 18     Temp 02/21/22 1832 98.4 F (36.9 C)     Temp src --      SpO2 02/21/22 1832 97 %     Weight --      Height --      Head Circumference --      Peak Flow --      Pain Score 02/21/22 1818 0     Pain Loc --      Pain Edu? --      Excl. in [place]? --    No data found.  Updated Vital Signs BP (!) 168/97   Pulse 74   Temp 98.4 F (36.9 C)   Resp 18   SpO2 97%   Visual Acuity Right Eye Distance:   Left Eye Distance:   Bilateral Distance:    Right Eye Near:   Left Eye Near:    Bilateral Near:     Physical Exam Constitutional:      Appearance:  Normal appearance.  Pulmonary:     Effort: Pulmonary effort is normal.  Skin:         Comments: Silver nitrate applied to wound; bleeding has stopped.   Neurological:     Mental Status: He is alert.      UC Treatments / Results  Labs (all labs ordered are listed,  but only abnormal results are displayed) Labs Reviewed - No data to display  EKG   Radiology No results found.  Procedures Procedures (including critical care time)  Medications Ordered in UC Medications - No data to display  Initial Impression / Assessment and Plan / UC Course  I have reviewed the triage vital signs and the nursing notes.  Pertinent labs & imaging results that were available during my care of the patient were reviewed by me and considered in my medical decision making (see chart for details).      Final Clinical Impressions(s) / UC Diagnoses   Final diagnoses:  Bleeding from wound     Discharge Instructions      Keep wound clean and covered. It should not bleed again. It is ok to shower.    ED Prescriptions   None    PDMP not reviewed this encounter.   Carvel Getting, NP 02/21/22 1941

## 2022-02-21 NOTE — Progress Notes (Signed)
[place] Phone: [phone] Subjective:   [name], am serving as a scribe for Dr. Hulan Saas.  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  Patient did have an MRI in November 2022 X-rays today do show that patient has progressed fairly significantly from moderate arthritic changes to severe nearly bone-on-bone arthritic changes.  This is affecting all daily activities as well as working out at night.  Patient has intermittent relief with massage.  Patient has undergone radiation and with recent prostate cancer and would like to be more active but is finding it difficult secondary to the hip.  I do believe he would be a good candidate for hip replacement.  Patient is an avid biker and would like to continue to do so long-term we will refer patient to orthopedic surgery.  Total time with patient 33 minutes  OMT on 01/17/2022  Updated 02/22/2022 Edwin [name] is a 81 y.o. male coming in with complaint of R hip pain. Patient states that he met with Dr. Rhona Raider.  Is considering replacement in the near future.  Patient is getting clearance from his other physicians still having back pain.  Intermittent radicular symptoms down the right leg.  Continues to try to be active or possible.       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   right lateral deltoid BASAL CELL CARCINOMA, SUPERFICIAL AND NODULAR PATTERNS   BCC (basal cell carcinoma of skin) 04/23/2017   left lat neck, excision RESIDUAL BASAL CELL CARCINOMA, MARGINS FREE   BCC (basal cell carcinoma of skin) 03/13/2017   left lateral neck  BASAL CELL CARCINOMA, NODULAR PATTERN   BCC (basal cell carcinoma of skin) 12/25/2012   left pretibial   BCC (basal cell carcinoma of skin) 05/21/2007    left medial calf - , 0.8 X 0.5CM: SUPERFICIAL BASAL CELL CARCINOMA, BASE INVOLVED   BPH (benign prostatic hyperplasia)    Cancer (HCC)    Prostate Cancer   Dysrhythmia    SVT   Hyperlipidemia    Hypertension    PTSD (post-traumatic stress disorder)    Pulmonary nodules    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   right inferior cheek mandible SQUAMOUS CELL CARCINOMA IN SITU   Past Surgical History:  Procedure Laterality Date   BACK SURGERY     Laminectomy x 3   COLONOSCOPY N/A 02/10/2015   Procedure: COLONOSCOPY;  Surgeon: Manya Silvas, MD;  Location: [facility] ENDOSCOPY;  Service: Endoscopy;  Laterality: N/A;   PROSTATECTOMY     Surgery for Urethral Stricture     Social History   Socioeconomic History   Marital status: Significant Other    Spouse name: Not on file   Number of children: Not on file   Years of education: Not on file   Highest education level: Not on file  Occupational History   Not on file  Tobacco Use   Smoking status: Former    Packs/day: 0.70    Years: 5.00    Total pack years: 3.50    Types: Cigarettes    Quit date: 07/23/1965    Years since quitting: 56.6   Smokeless tobacco: Never  Substance and Sexual Activity   Alcohol use: No   Drug use: No  Sexual activity: Not on file  Other Topics Concern   Not on file  Social History Narrative   Not on file   Social Determinants of Health   Financial Resource Strain: Not on file  Food Insecurity: Not on file  Transportation Needs: Not on file  Physical Activity: Not on file  Stress: Not on file  Social Connections: Not on file   Allergies  Allergen Reactions   Amoxicillin-Pot Clavulanate Diarrhea   Bacitracin-Neomycin-Polymyxin Hives   Hydrochlorothiazide    Lipitor [Atorvastatin]    Lisinopril    Neosporin [Neomycin-Bacitracin Zn-Polymyx]    No family history on file.  Current Outpatient Medications (Endocrine & Metabolic):    predniSONE (DELTASONE) 50 MG tablet, Take one tablet  daily for the next 5 days.  Current Outpatient Medications (Cardiovascular):    metoprolol tartrate (LOPRESSOR) 100 MG tablet, Take 1 tablet (100 mg total) by mouth once for 1 dose. Please take one time dose 165m metoprolol tartrate 2 hr prior to cardiac CT for HR control IF HR >55bpm.   valsartan (DIOVAN) 80 MG tablet, Take by mouth.   Current Outpatient Medications (Analgesics):    aspirin EC 81 MG tablet, Take 81 mg by mouth daily.   Current Outpatient Medications (Other):    co-enzyme Q-10 30 MG capsule, Take 30 mg by mouth 3 (three) times daily.   Krill Oil 1000 MG CAPS, Take 2 capsules by mouth daily.   MAGNESIUM GLUCONATE PO, Take 400 mg by mouth at bedtime.   Multiple Vitamin (MULTIVITAMIN) tablet, Take 1 tablet by mouth daily.   Ubiquinol 50 MG CAPS, Take 1 tablet by mouth.   Vitamin D, Ergocalciferol, (DRISDOL) 1.25 MG (50000 UNIT) CAPS capsule, Take 1 capsule (50,000 Units total) by mouth every 7 (seven) days.   Vitamin D, Ergocalciferol, (DRISDOL) 1.25 MG (50000 UNIT) CAPS capsule, Take 1 capsule (50,000 Units total) by mouth every 7 (seven) days.   Reviewed prior external information including notes and imaging from  primary care provider As well as notes that were available from care everywhere and other healthcare systems.  Past medical history, social, surgical and family history all reviewed in electronic medical record.  No pertanent information unless stated regarding to the chief complaint.   Review of Systems:  No headache, visual changes, nausea, vomiting, diarrhea, constipation, dizziness, abdominal pain, skin rash, fevers, chills, night sweats, weight loss, swollen lymph nodes, [name] aches, joint swelling, chest pain, shortness of breath, mood changes. POSITIVE muscle aches  Objective  Blood pressure 126/86, pulse 61, height _0  (1.854 m), weight 226 lb (102.5 kg), SpO2 97 %.   General: No apparent distress alert and oriented x3 mood and affect normal,  dressed appropriately.  HEENT: Pupils equal, extraocular movements intact  Respiratory: Patient's speak in full sentences and does not appear short of breath  Cardiovascular: No lower extremity edema, non tender, no erythema  Low back exam does have loss of lordosis.  Minimal to no movement with internal or external range of motion on the right hip.  Severely antalgic gait noted.  Osteopathic findings C2 flexed rotated and side bent right C7 flexed rotated and side bent left T3 extended rotated and side bent right inhaled third rib T9 extended rotated and side bent left L2 flexed rotated and side bent right Sacrum right on right     Impression and Recommendations:     The above documentation has been reviewed and is accurate and complete ZLyndal Pulley DO

## 2022-02-21 NOTE — ED Triage Notes (Signed)
Pt had a skin tag removed today at dermatology and the site has been bleeding since then. It is bleeding through the bandage and shirt.

## 2022-02-21 NOTE — Discharge Instructions (Addendum)
Keep wound clean and covered. It should not bleed again. It is ok to shower.

## 2022-02-22 ENCOUNTER — Ambulatory Visit: Payer: Medicare Other | Admitting: Family Medicine

## 2022-02-22 VITALS — BP 126/86 | HR 61 | Ht 73.0 in | Wt 226.0 lb

## 2022-02-22 DIAGNOSIS — M5416 Radiculopathy, lumbar region: Secondary | ICD-10-CM

## 2022-02-22 DIAGNOSIS — M999 Biomechanical lesion, unspecified: Secondary | ICD-10-CM

## 2022-02-22 DIAGNOSIS — M9908 Segmental and somatic dysfunction of rib cage: Secondary | ICD-10-CM

## 2022-02-22 DIAGNOSIS — M9903 Segmental and somatic dysfunction of lumbar region: Secondary | ICD-10-CM

## 2022-02-22 DIAGNOSIS — M1611 Unilateral primary osteoarthritis, right hip: Secondary | ICD-10-CM

## 2022-02-22 DIAGNOSIS — M9904 Segmental and somatic dysfunction of sacral region: Secondary | ICD-10-CM

## 2022-02-22 DIAGNOSIS — M9901 Segmental and somatic dysfunction of cervical region: Secondary | ICD-10-CM

## 2022-02-22 DIAGNOSIS — M9902 Segmental and somatic dysfunction of thoracic region: Secondary | ICD-10-CM

## 2022-02-22 NOTE — Patient Instructions (Signed)
You are in good hands with Dr. Rhona Raider See me in 6-8 weeks

## 2022-02-23 NOTE — Assessment & Plan Note (Signed)
Patient has been cleared anything from the primary care and cardiology standpoint to have his hip replaced.  Hopefully this will be done in the near future.

## 2022-02-23 NOTE — Assessment & Plan Note (Signed)

## 2022-02-23 NOTE — Assessment & Plan Note (Signed)
Continued some back pain with some very mild intermittent radicular symptoms.  Hopefully some is compensating for the hip.  Responded well though to osteopathic manipulation.  Discussed with patient about icing regimen and home exercises otherwise.  Continue core strengthening.  Response to manipulation.  Follow-up again in 6 weeks after surgical intervention for the hip

## 2022-03-07 ENCOUNTER — Ambulatory Visit: Payer: Medicare Other | Admitting: Family Medicine

## 2022-03-07 VITALS — Ht 73.0 in

## 2022-03-07 DIAGNOSIS — S134XXA Sprain of ligaments of cervical spine, initial encounter: Secondary | ICD-10-CM

## 2022-03-07 DIAGNOSIS — M9902 Segmental and somatic dysfunction of thoracic region: Secondary | ICD-10-CM

## 2022-03-07 DIAGNOSIS — M9904 Segmental and somatic dysfunction of sacral region: Secondary | ICD-10-CM | POA: Diagnosis not present

## 2022-03-07 DIAGNOSIS — M9901 Segmental and somatic dysfunction of cervical region: Secondary | ICD-10-CM

## 2022-03-07 DIAGNOSIS — M9903 Segmental and somatic dysfunction of lumbar region: Secondary | ICD-10-CM | POA: Diagnosis not present

## 2022-03-07 MED ORDER — TIZANIDINE HCL 2 MG PO TABS: 2.0000 mg | ORAL_TABLET | Freq: Every day | ORAL | 0 refills | Status: DC

## 2022-03-07 MED ORDER — KETOROLAC TROMETHAMINE 30 MG/ML IJ SOLN
30.0000 mg | Freq: Once | INTRAMUSCULAR | Status: AC
  Administered 2022-03-07: 30 mg via INTRAMUSCULAR

## 2022-03-07 MED ORDER — PREDNISONE 20 MG PO TABS: 20.0000 mg | ORAL_TABLET | Freq: Every day | ORAL | 0 refills | Status: DC

## 2022-03-07 MED ORDER — METHYLPREDNISOLONE ACETATE 40 MG/ML IJ SUSP
40.0000 mg | Freq: Once | INTRAMUSCULAR | Status: AC
  Administered 2022-03-07: 40 mg via INTRAMUSCULAR
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
## 2022-03-07 NOTE — Patient Instructions (Signed)
Injections in backside today Start prednisone '20mg'$  daily tomorrow for 5 days Zanaflex at night  See me at next scheduled visit
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
## 2022-03-08 ENCOUNTER — Other Ambulatory Visit: Payer: Self-pay | Admitting: Orthopaedic Surgery

## 2022-03-21 ENCOUNTER — Inpatient Hospital Stay (HOSPITAL_COMMUNITY): Admission: RE | Admit: 2022-03-21 | Payer: Medicare Other | Source: Ambulatory Visit

## 2022-03-30 NOTE — Patient Instructions (Signed)
SURGICAL WAITING ROOM VISITATION Patients having surgery or a procedure may have no more than 2 support people in the waiting area - these visitors may rotate.   Children under the age of 11 must have an adult with them who is not the patient. If the patient needs to stay at the hospital during part of their recovery, the visitor guidelines for inpatient rooms apply. Pre-op nurse will coordinate an appropriate time for 1 support person to accompany patient in pre-op.  This support person may not rotate.    Please refer to the [facility] website for the visitor guidelines for Inpatients (after your surgery is over and you are in a regular room).     Your procedure is scheduled on: 04/10/22   Report to [facility] Main Entrance    Report to admitting at 7:40 AM   Call this number if you have problems the morning of surgery [phone]   Do not eat food :After Midnight.   After Midnight you may have the following liquids until 7:10 AM DAY OF SURGERY  Water Non-Citrus Juices (without pulp, NO RED) Carbonated Beverages Black Coffee (NO MILK/CREAM OR CREAMERS, sugar ok)  Clear Tea (NO MILK/CREAM OR CREAMERS, sugar ok) regular and decaf                             Plain Jell-O (NO RED)                                           Fruit ices (not with fruit pulp, NO RED)                                     Popsicles (NO RED)                                                               Sports drinks like Gatorade (NO RED)               The day of surgery:  Drink ONE (1) Pre-Surgery Clear Ensure at 7:10 AM the morning of surgery. Drink in one sitting. Do not sip.  This drink was given to you during your hospital  pre-op appointment visit. Nothing else to drink after completing the  Pre-Surgery Clear Ensure.          If you have questions, please contact your surgeon's office.   FOLLOW BOWEL PREP AND ANY ADDITIONAL PRE OP INSTRUCTIONS YOU RECEIVED FROM YOUR SURGEON'S OFFICE!!!      Oral Hygiene is also important to reduce your risk of infection.                                    Remember - BRUSH YOUR TEETH THE MORNING OF SURGERY WITH YOUR REGULAR TOOTHPASTE   Take these medicines the morning of surgery with A SIP OF WATER: Metoprolol  You may not have any metal on your body including jewelry, and body piercing             Do not wear lotions, powders, cologne, or deodorant              Men may shave face and neck.   Do not bring valuables to the hospital. [place].   Contacts, dentures or bridgework may not be worn into surgery.  DO NOT [place]. PHARMACY WILL DISPENSE MEDICATIONS LISTED ON YOUR MEDICATION LIST TO YOU DURING YOUR ADMISSION [place]!    Patients discharged on the day of surgery will not be allowed to drive home.  Someone NEEDS to stay with you for the first 24 hours after anesthesia.              Please read over the following fact sheets you were given: IF YOU HAVE QUESTIONS ABOUT YOUR PRE-OP INSTRUCTIONS PLEASE CALL [place] - Preparing for Surgery Before surgery, you can play an important role.  Because skin is not sterile, your skin needs to be as free of germs as possible.  You can reduce the number of germs on your skin by washing with CHG (chlorahexidine gluconate) soap before surgery.  CHG is an antiseptic cleaner which kills germs and bonds with the skin to continue killing germs even after washing. Please DO NOT use if you have an allergy to CHG or antibacterial soaps.  If your skin becomes reddened/irritated stop using the CHG and inform your nurse when you arrive at Short Stay. Do not shave (including legs and underarms) for at least 48 hours prior to the first CHG shower.  You may shave your face/neck.  Please follow these instructions carefully:  1.  Shower with CHG Soap the night  before surgery and the  morning of surgery.  2.  If you choose to wash your hair, wash your hair first as usual with your normal  shampoo.  3.  After you shampoo, rinse your hair and body thoroughly to remove the shampoo.                             4.  Use CHG as you would any other liquid soap.  You can apply chg directly to the skin and wash.  Gently with a scrungie or clean washcloth.  5.  Apply the CHG Soap to your body ONLY FROM THE NECK DOWN.   Do   not use on face/ open                           Wound or open sores. Avoid contact with eyes, ears mouth and   genitals (private parts).                       Wash face,  Genitals (private parts) with your normal soap.             6.  Wash thoroughly, paying special attention to the area where your    surgery  will be performed.  7.  Thoroughly rinse your body with warm water from the neck down.  8.  DO NOT shower/wash with your normal soap after using and rinsing  off the CHG Soap.                9.  Pat yourself dry with a clean towel.            10.  Wear clean pajamas.            11.  Place clean sheets on your bed the night of your first shower and do not  sleep with pets. Day of Surgery : Do not apply any lotions/deodorants the morning of surgery.  Please wear clean clothes to the hospital/surgery center.  FAILURE TO FOLLOW THESE INSTRUCTIONS MAY RESULT IN THE CANCELLATION OF YOUR SURGERY  PATIENT SIGNATURE_________________________________  NURSE SIGNATURE__________________________________  ________________________________________________________________________   Edwin [name]  An incentive spirometer is a tool that can help keep your lungs clear and active. This tool measures how well you are filling your lungs with each breath. Taking long deep breaths may help reverse or decrease the chance of developing breathing (pulmonary) problems (especially infection) following: A long period of time when you are unable to move or  be active. BEFORE THE PROCEDURE  If the spirometer includes an indicator to show your best effort, your nurse or respiratory therapist will set it to a desired goal. If possible, sit up straight or lean slightly forward. Try not to slouch. Hold the incentive spirometer in an upright position. INSTRUCTIONS FOR USE  Sit on the edge of your bed if possible, or sit up as far as you can in bed or on a chair. Hold the incentive spirometer in an upright position. Breathe out normally. Place the mouthpiece in your mouth and seal your lips tightly around it. Breathe in slowly and as deeply as possible, raising the piston or the ball toward the top of the column. Hold your breath for 3-5 seconds or for as long as possible. Allow the piston or ball to fall to the bottom of the column. Remove the mouthpiece from your mouth and breathe out normally. Rest for a few seconds and repeat Steps 1 through 7 at least 10 times every 1-2 hours when you are awake. Take your time and take a few normal breaths between deep breaths. The spirometer may include an indicator to show your best effort. Use the indicator as a goal to work toward during each repetition. After each set of 10 deep breaths, practice coughing to be sure your lungs are clear. If you have an incision (the cut made at the time of surgery), support your incision when coughing by placing a pillow or rolled up towels firmly against it. Once you are able to get out of bed, walk around indoors and cough well. You may stop using the incentive spirometer when instructed by your caregiver.  RISKS AND COMPLICATIONS Take your time so you do not get dizzy or light-headed. If you are in pain, you may need to take or ask for pain medication before doing incentive spirometry. It is harder to take a deep breath if you are having pain. AFTER USE Rest and breathe slowly and easily. It can be helpful to keep track of a log of your progress. Your caregiver can provide  you with a simple table to help with this. If you are using the spirometer at home, follow these instructions: [place] IF:  You are having difficultly using the spirometer. You have trouble using the spirometer as often as instructed. Your pain medication is not giving enough relief while using the spirometer. You develop fever  of 100.5 F (38.1 C) or higher. SEEK IMMEDIATE MEDICAL CARE IF:  You cough up bloody sputum that had not been present before. You develop fever of 102 F (38.9 C) or greater. You develop worsening pain at or near the incision site. MAKE SURE YOU:  Understand these instructions. Will watch your condition. Will get help right away if you are not doing well or get worse. Document Released: 11/19/2006 Document Revised: 10/01/2011 Document Reviewed: 01/20/2007 ExitCare Patient Information 2014 Exit[employer], [place].   ________________________________________________________________________  WHAT IS A BLOOD TRANSFUSION? Blood Transfusion Information  A transfusion is the replacement of blood or some of its parts. Blood is made up of multiple cells which provide different functions. Red blood cells carry oxygen and are used for blood loss replacement. White blood cells fight against infection. Platelets control bleeding. Plasma helps clot blood. Other blood products are available for specialized needs, such as hemophilia or other clotting disorders. BEFORE THE TRANSFUSION  Who gives blood for transfusions?  Healthy volunteers who are fully evaluated to make sure their blood is safe. This is blood bank blood. Transfusion therapy is the safest it has ever been in the practice of medicine. Before blood is taken from a donor, a complete history is taken to make sure that person has no history of diseases nor engages in risky social behavior (examples are intravenous drug use or sexual activity with multiple partners). The donor's travel history is screened to  minimize risk of transmitting infections, such as malaria. The donated blood is tested for signs of infectious diseases, such as HIV and hepatitis. The blood is then tested to be sure it is compatible with you in order to minimize the chance of a transfusion reaction. If you or a relative donates blood, this is often done in anticipation of surgery and is not appropriate for emergency situations. It takes many days to process the donated blood. RISKS AND COMPLICATIONS Although transfusion therapy is very safe and saves many lives, the main dangers of transfusion include:  Getting an infectious disease. Developing a transfusion reaction. This is an allergic reaction to something in the blood you were given. Every precaution is taken to prevent this. The decision to have a blood transfusion has been considered carefully by your caregiver before blood is given. Blood is not given unless the benefits outweigh the risks. AFTER THE TRANSFUSION Right after receiving a blood transfusion, you will usually feel much better and more energetic. This is especially true if your red blood cells have gotten low (anemic). The transfusion raises the level of the red blood cells which carry oxygen, and this usually causes an energy increase. The nurse administering the transfusion will monitor you carefully for complications. HOME CARE INSTRUCTIONS  No special instructions are needed after a transfusion. You may find your energy is better. Speak with your caregiver about any limitations on activity for underlying diseases you may have. SEEK MEDICAL CARE IF:  Your condition is not improving after your transfusion. You develop redness or irritation at the intravenous (IV) site. SEEK IMMEDIATE MEDICAL CARE IF:  Any of the following symptoms occur over the next 12 hours: Shaking chills. You have a temperature by mouth above 102 F (38.9 C), not controlled by medicine. Chest, back, or muscle pain. People around you feel  you are not acting correctly or are confused. Shortness of breath or difficulty breathing. Dizziness and fainting. You get a rash or develop hives. You have a decrease in urine output. Your urine turns a  dark color or changes to pink, red, or brown. Any of the following symptoms occur over the next 10 days: You have a temperature by mouth above 102 F (38.9 C), not controlled by medicine. Shortness of breath. Weakness after normal activity. The white part of the eye turns yellow (jaundice). You have a decrease in the amount of urine or are urinating less often. Your urine turns a dark color or changes to pink, red, or brown. Document Released: 07/06/2000 Document Revised: 10/01/2011 Document Reviewed: 02/23/2008 [facility] Patient Information 2014 [place], [place].  _______________________________________________________________________

## 2022-03-30 NOTE — Progress Notes (Signed)
COVID Vaccine Completed:no  Date of COVID positive in last 90 days: o  PCP - [name], MD Cardiologist - Serafina Royals, DO  Chest x-ray - yes [facility] req EKG - 11/30/21 req [facility] Stress Test - yes few years per pt ECHO - 04/02/19 [facility] Cardiac Cath - n/a Pacemaker/ICD device last checked: n.a Spinal Cord Stimulator: n/a  Bowel Prep - no  Sleep Study - n/a CPAP -   Fasting Blood Sugar - n/a Checks Blood Sugar _____ times a day  Blood Thinner Instructions: Aspirin Instructions: ASAS 81, hold 7 days before Last Dose: 04/01/22  Activity level: Can go up a flight of stairs and perform activities of daily living without stopping and without symptoms of chest pain or shortness of breath.     Anesthesia review: Coronary artery calcification, HTN, SVT  Patient denies shortness of breath, fever, cough and chest pain at PAT appointment  Patient verbalized understanding of instructions that were given to them at the PAT appointment. Patient was also instructed that they will need to review over the PAT instructions again at home before surgery.

## 2022-04-02 ENCOUNTER — Encounter (HOSPITAL_COMMUNITY): Payer: Self-pay

## 2022-04-02 ENCOUNTER — Encounter (HOSPITAL_COMMUNITY)
Admission: RE | Admit: 2022-04-02 | Discharge: 2022-04-02 | Disposition: A | Discharge status: Home / Self Care | Payer: Medicare Other | Source: Ambulatory Visit | Attending: Orthopaedic Surgery | Admitting: Orthopaedic Surgery

## 2022-04-02 VITALS — BP 153/87 | HR 77 | Temp 97.9°F | Resp 14 | Ht 73.0 in | Wt 225.0 lb

## 2022-04-02 DIAGNOSIS — I10 Essential (primary) hypertension: Secondary | ICD-10-CM | POA: Diagnosis not present

## 2022-04-02 DIAGNOSIS — Z01818 Encounter for other preprocedural examination: Secondary | ICD-10-CM | POA: Insufficient documentation

## 2022-04-02 HISTORY — DX: Unspecified osteoarthritis, unspecified site: M19.90

## 2022-04-02 LAB — CBC
HCT: 44.6 % (ref 39.0–52.0)
Hemoglobin: 14.8 g/dL (ref 13.0–17.0)
MCH: 31.3 pg (ref 26.0–34.0)
MCHC: 33.2 g/dL (ref 30.0–36.0)
MCV: 94.3 fL (ref 80.0–100.0)
Platelets: 222 10*3/uL (ref 150–400)
RBC: 4.73 MIL/uL (ref 4.22–5.81)
RDW: 12.5 % (ref 11.5–15.5)
WBC: 6.7 10*3/uL (ref 4.0–10.5)
nRBC: 0 % (ref 0.0–0.2)

## 2022-04-02 LAB — BASIC METABOLIC PANEL
Anion gap: 8 (ref 5–15)
BUN: 15 mg/dL (ref 8–23)
CO2: 26 mmol/L (ref 22–32)
Calcium: 9 mg/dL (ref 8.9–10.3)
Chloride: 104 mmol/L (ref 98–111)
Creatinine, Ser: 0.92 mg/dL (ref 0.61–1.24)
GFR, Estimated: >60 mL/min (ref >60)
Glucose, Bld: 117 mg/dL — ABNORMAL HIGH (ref 70–99)
Potassium: 4.1 mmol/L (ref 3.5–5.1)
Sodium: 138 mmol/L (ref 135–145)

## 2022-04-02 LAB — SURGICAL PCR SCREEN
MRSA, PCR: NEGATIVE
Staphylococcus aureus: NEGATIVE

## 2022-04-04 NOTE — Progress Notes (Signed)
[place] Phone: [phone] Subjective:   [name], am serving as a scribe for Dr. Hulan Saas.  I'm seeing this patient by the request  of:  [name], MD  CC: Hip and back pain follow-up  WLN:[id number]  MCCLAIN SHALL is a 81 y.o. male coming in with complaint of back and neck pain. OMT on 03/07/2022.  Patient will be undergoing hip replacement in the near future.  Patient states that his back and neck are the same as last visit. No changes.   Medications patient has been prescribed: prednisone zanaflex       Reviewed prior external information including notes and imaging from previsou exam, outside providers and external EMR if available.   As well as notes that were available from care everywhere and other healthcare systems.  Past medical history, social, surgical and family history all reviewed in electronic medical record.  No pertanent information unless stated regarding to the chief complaint.   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/2017   left lat neck, excision RESIDUAL BASAL CELL CARCINOMA, MARGINS FREE   BCC (basal cell carcinoma of skin) 03/13/2017   left lateral neck  BASAL CELL CARCINOMA, NODULAR PATTERN   BCC (basal cell carcinoma of skin) 12/25/2012   left pretibial   BCC (basal cell carcinoma of skin) 05/21/2007   left medial calf - , 0.8 X 0.5CM: SUPERFICIAL BASAL CELL CARCINOMA, BASE INVOLVED   BPH (benign prostatic hyperplasia)    Cancer (HCC)    Prostate Cancer   Dysrhythmia    SVT   Hyperlipidemia    Hypertension    PTSD (post-traumatic stress disorder)    Pulmonary nodules    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 POSITIVE muscle aches  Objective  Blood pressure 124/78, pulse 99, height '6\' 1"'$  (1.854 m), weight 223 lb (101.2 kg), SpO2 95 %.   General: No apparent distress alert and oriented x3 mood and affect normal, dressed appropriately.  HEENT: Pupils equal, extraocular movements intact  Respiratory: Patient's speak in full sentences and does not appear short of breath  Cardiovascular: No lower extremity edema, non tender, no erythema  Gait antalgic fovoring right hip  MSK:  Back low back does have tightness   Osteopathic findings  C3 flexed rotated and side bent right T3 extended rotated and side bent right inhaled rib T7 extended rotated and side bent left L2 flexed rotated and side bent right Sacrum right on right       Assessment and Plan:  Arthritis of right hip Getting replaced in the near future.  Patient will do very well with the anterior approach.  Lumbar radiculopathy Stable at the moment.  Does have spinal stenosis noted.  Patient has also had a history of prostate cancer but seems to be in remission at the moment.  Discussed posture and ergonomics. Increase activity.  Follow-up again in 6 to 8 weeks    Nonallopathic problems  Decision today to treat with OMT was based on Physical Exam  After verbal consent patient was treated with HVLA, ME, FPR techniques in cervical, rib, thoracic, lumbar, and sacral   areas  Patient tolerated the procedure well with improvement in symptoms  Patient given exercises, stretches and lifestyle modifications  See medications in patient instructions if given  Patient will follow up in 4-8 weeks    The above documentation has been reviewed and is accurate and complete [name], DO          Note: This dictation was prepared with Dragon dictation along with smaller phrase technology. Any transcriptional errors that result from this process are unintentional.

## 2022-04-04 NOTE — H&P (Signed)
TOTAL HIP ADMISSION H&P  Patient is admitted for right total hip arthroplasty.  Subjective:  Chief Complaint: right hip pain  HPI: Edwin [name], 81 y.o. male, has a history of pain and functional disability in the right hip(s) due to arthritis and patient has failed non-surgical conservative treatments for greater than 12 weeks to include NSAID's and/or analgesics, corticosteriod injections, flexibility and strengthening excercises, use of assistive devices, weight reduction as appropriate, and activity modification.  Onset of symptoms was gradual starting 5 years ago with gradually worsening course since that time.The patient noted no past surgery on the right hip(s).  Patient currently rates pain in the right hip at 10 out of 10 with activity. Patient has night pain, worsening of pain with activity and weight bearing, trendelenberg gait, pain that interfers with activities of daily living, and crepitus. Patient has evidence of subchondral cysts, subchondral sclerosis, periarticular osteophytes, and joint space narrowing by imaging studies. This condition presents safety issues increasing the risk of falls. There is no current active infection.  Patient Active Problem List   Diagnosis Date Noted   Whiplash injuries, initial encounter 03/07/2022   Arthritis of right hip 02/08/2022   Left-sided chest wall pain 07/12/2021   Greater trochanteric bursitis of both hips 06/23/2021   Nonallopathic lesion of lumbosacral region 06/25/2019   Nonallopathic lesion of rib cage 06/25/2019   COVID-19 virus infection 01/27/2019   HTN (hypertension) 01/27/2019   Hyponatremia 01/27/2019   Class 1 obesity due to excess calories without serious comorbidity with body mass index (BMI) of 30.0 to 30.9 in adult 09/02/2018   History of prostate cancer 09/25/2016   Lumbar radiculopathy 09/25/2016   Pronation deformity of ankle, 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   Nonallopathic lesion of thoracic region 09/25/2016   Nonallopathic  lesion of sacral region 09/25/2016   Nonallopathic lesion of cervical region 09/25/2016   Benign essential hypertension 05/19/2015   Hyperlipidemia, mixed 05/19/2015   Malignant neoplasm of prostate ([place]) 08/20/2014   Coronary artery calcification seen on CAT scan 06/03/2014   Supraventricular tachycardia ([place]) 06/03/2014   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/2017   left lat neck, excision RESIDUAL BASAL CELL CARCINOMA, MARGINS FREE   BCC (basal cell carcinoma of skin) 03/13/2017   left lateral neck  BASAL CELL CARCINOMA, NODULAR PATTERN   BCC (basal cell carcinoma of skin) 12/25/2012   left pretibial   BCC (basal cell carcinoma of skin) 05/21/2007   left medial calf - , 0.8 X 0.5CM: SUPERFICIAL BASAL CELL CARCINOMA, BASE INVOLVED   BPH (benign prostatic hyperplasia)    Cancer (HCC)    Prostate Cancer   Dysrhythmia    SVT   Hyperlipidemia    Hypertension    PTSD (post-traumatic stress disorder)    Pulmonary nodules    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   Current Outpatient Medications  Medication Sig Dispense Refill Last Dose   aspirin EC 81 MG tablet Take 81 mg by mouth daily.      Cholecalciferol (VITAMIN D3 PO) Take 1 tablet by mouth daily.      co-enzyme Q-10 30 MG capsule Take 30 mg by mouth 3 (three) times daily.      Cyanocobalamin (B-12 PO) Take 1 tablet  by mouth daily.      Krill Oil 1000 MG CAPS Take 2,000 mg by mouth daily.      MAGNESIUM GLUCONATE PO Take 400 mg by mouth at bedtime.      Multiple Vitamin (MULTIVITAMIN) tablet Take 1 tablet by mouth daily.      Ubiquinol 50 MG CAPS Take 50 mg by mouth daily.      valsartan (DIOVAN) 80 MG tablet Take 80 mg by mouth 2 (two) times daily.      metoprolol tartrate (LOPRESSOR) 100 MG tablet Take 1 tablet (100 mg total) by mouth once for 1 dose. Please take one time dose '100mg'$  metoprolol tartrate 2 hr prior to cardiac CT for HR control IF HR >55bpm. 1 tablet 0    predniSONE (DELTASONE) 20 MG tablet Take 1 tablet (20 mg total) by mouth daily with breakfast. (Patient not taking: Reported on 03/28/2022) 5 tablet 0 Not Taking   tiZANidine (ZANAFLEX) 2 MG tablet Take 1 tablet (2 mg total) by mouth at bedtime. (Patient not taking: Reported on 03/28/2022) 30 tablet 0 Not Taking   Vitamin D, Ergocalciferol, (DRISDOL) 1.25 MG (50000 UNIT) CAPS capsule Take 1 capsule (50,000 Units total) by mouth every 7 (seven) days. (Patient not taking: Reported on 03/28/2022) 12 capsule 0 Not Taking   Vitamin D, Ergocalciferol, (DRISDOL) 1.25 MG (50000 UNIT) CAPS capsule Take 1 capsule (50,000 Units total) by mouth every 7 (seven) days. (Patient not taking: Reported on 03/28/2022) 12 capsule 0 Not Taking   Allergies  Allergen Reactions   Amoxicillin-Pot Clavulanate Diarrhea   Bacitracin-Neomycin-Polymyxin Hives   Hydrochlorothiazide    Lipitor [Atorvastatin]     Muscle soreness   Lisinopril Cough   Bacitracin-Polymyxin B Rash   Neosporin [Neomycin-Bacitracin Zn-Polymyx] Rash    Social History   Tobacco Use   Smoking status: Former    Packs/day: 0.70    Years: 5.00    Total pack years: 3.50    Types: Cigarettes    Quit date: 07/23/1965    Years since quitting: 56.7   Smokeless tobacco: Never  Substance Use Topics   Alcohol use: No    No family history on file.   Review of Systems  Musculoskeletal:  Positive for  arthralgias.       Right hip  All other systems reviewed and are negative.   Objective:  Physical Exam Constitutional:      Appearance: Normal appearance.  HENT:     Head: Normocephalic and atraumatic.     Mouth/Throat:     Pharynx: Oropharynx is clear.  Eyes:     Extraocular Movements: Extraocular movements intact.  Cardiovascular:     Rate and Rhythm: Normal rate.  Pulmonary:     Effort: Pulmonary effort is normal.  Abdominal:     Palpations: Abdomen is soft.  Musculoskeletal:     Cervical back: Normal range of motion.     Comments: Right hip motion is limited and extremely painful with internal rotation.  Leg lengths are roughly equal.  He walks with an altered gait.  Sensation and motor function are intact distally with palpable pulses in his  feet.  Abdominal exam is benign.   Skin:    General: Skin is warm and dry.  Neurological:     General: No focal deficit present.     Mental Status: He is alert and oriented to person, place, and time.  Psychiatric:        Mood and Affect: Mood normal.        Behavior: Behavior normal.        Thought Content: Thought content normal.        Judgment: Judgment normal.     Vital signs in last 24 hours:    Labs:   Estimated body mass index is 29.69 kg/m as calculated from the following:   Height as of 04/02/22: '6\' 1"'$  (1.854 m).   Weight as of 04/02/22: 102.1 kg.   Imaging Review Plain radiographs demonstrate severe degenerative joint disease of the right hip(s). The bone quality appears to be good for age and reported activity level.      Assessment/Plan:  End stage primary arthritis, right hip(s)  The patient history, physical examination, clinical judgement of the provider and imaging studies are consistent with end stage degenerative joint disease of the right hip(s) and total hip arthroplasty is deemed medically necessary. The treatment options including medical management, injection therapy, arthroscopy and  arthroplasty were discussed at length. The risks and benefits of total hip arthroplasty were presented and reviewed. The risks due to aseptic loosening, infection, stiffness, dislocation/subluxation,  thromboembolic complications and other imponderables were discussed.  The patient acknowledged the explanation, agreed to proceed with the plan and consent was signed. Patient is being admitted for inpatient treatment for surgery, pain control, PT, OT, prophylactic antibiotics, VTE prophylaxis, progressive ambulation and ADL's and discharge planning.The patient is planning to be discharged home with home health services

## 2022-04-05 ENCOUNTER — Ambulatory Visit: Payer: Medicare Other | Admitting: Family Medicine

## 2022-04-05 ENCOUNTER — Encounter: Payer: Self-pay | Admitting: Family Medicine

## 2022-04-05 VITALS — BP 124/78 | HR 99 | Ht 73.0 in | Wt 223.0 lb

## 2022-04-05 DIAGNOSIS — M5416 Radiculopathy, lumbar region: Secondary | ICD-10-CM

## 2022-04-05 DIAGNOSIS — M1611 Unilateral primary osteoarthritis, right hip: Secondary | ICD-10-CM | POA: Diagnosis not present

## 2022-04-05 DIAGNOSIS — M9903 Segmental and somatic dysfunction of lumbar region: Secondary | ICD-10-CM

## 2022-04-05 DIAGNOSIS — M9904 Segmental and somatic dysfunction of sacral region: Secondary | ICD-10-CM

## 2022-04-05 DIAGNOSIS — M9908 Segmental and somatic dysfunction of rib cage: Secondary | ICD-10-CM

## 2022-04-05 DIAGNOSIS — M9902 Segmental and somatic dysfunction of thoracic region: Secondary | ICD-10-CM

## 2022-04-05 DIAGNOSIS — M9901 Segmental and somatic dysfunction of cervical region: Secondary | ICD-10-CM

## 2022-04-05 NOTE — Assessment & Plan Note (Signed)
Getting replaced in the near future.  Patient will do very well with the anterior approach.

## 2022-04-05 NOTE — Assessment & Plan Note (Signed)
Stable at the moment.  Does have spinal stenosis noted.  Patient has also had a history of prostate cancer but seems to be in remission at the moment.  Discussed posture and ergonomics. Increase activity.  Follow-up again in 6 to 8 weeks

## 2022-04-10 ENCOUNTER — Ambulatory Visit (HOSPITAL_COMMUNITY)
Admission: RE | Admit: 2022-04-10 | Discharge: 2022-04-10 | Disposition: A | Discharge status: Home / Self Care | Payer: Medicare Other | Source: Ambulatory Visit | Attending: Orthopaedic Surgery | Admitting: Orthopaedic Surgery

## 2022-04-10 ENCOUNTER — Ambulatory Visit (HOSPITAL_COMMUNITY): Payer: Medicare Other

## 2022-04-10 ENCOUNTER — Encounter (HOSPITAL_COMMUNITY): Payer: Self-pay | Admitting: Orthopaedic Surgery

## 2022-04-10 ENCOUNTER — Ambulatory Visit (HOSPITAL_COMMUNITY): Payer: Medicare Other | Admitting: Physician Assistant

## 2022-04-10 ENCOUNTER — Ambulatory Visit (HOSPITAL_BASED_OUTPATIENT_CLINIC_OR_DEPARTMENT_OTHER): Payer: Medicare Other | Admitting: Certified Registered"

## 2022-04-10 ENCOUNTER — Encounter (HOSPITAL_COMMUNITY)
Admission: RE | Disposition: A | Discharge status: Home / Self Care | Payer: Self-pay | Source: Ambulatory Visit | Attending: Orthopaedic Surgery

## 2022-04-10 DIAGNOSIS — I2510 Atherosclerotic heart disease of native coronary artery without angina pectoris: Secondary | ICD-10-CM | POA: Diagnosis not present

## 2022-04-10 DIAGNOSIS — M1611 Unilateral primary osteoarthritis, right hip: Secondary | ICD-10-CM | POA: Diagnosis present

## 2022-04-10 DIAGNOSIS — Z8546 Personal history of malignant neoplasm of prostate: Secondary | ICD-10-CM | POA: Insufficient documentation

## 2022-04-10 DIAGNOSIS — I10 Essential (primary) hypertension: Secondary | ICD-10-CM | POA: Diagnosis not present

## 2022-04-10 DIAGNOSIS — Z923 Personal history of irradiation: Secondary | ICD-10-CM | POA: Insufficient documentation

## 2022-04-10 DIAGNOSIS — N320 Bladder-neck obstruction: Secondary | ICD-10-CM | POA: Insufficient documentation

## 2022-04-10 DIAGNOSIS — Z87891 Personal history of nicotine dependence: Secondary | ICD-10-CM | POA: Diagnosis not present

## 2022-04-10 HISTORY — PX: TOTAL HIP ARTHROPLASTY: SHX124

## 2022-04-10 HISTORY — PX: CYSTOSCOPY: SHX5120

## 2022-04-10 LAB — TYPE AND SCREEN
ABO/RH(D): A POS
Antibody Screen: NEGATIVE

## 2022-04-10 LAB — ABO/RH: ABO/RH(D): A POS

## 2022-04-10 SURGERY — ARTHROPLASTY, HIP, TOTAL, ANTERIOR APPROACH
Anesthesia: Monitor Anesthesia Care | Site: Urethra | Laterality: Right

## 2022-04-10 MED ORDER — BUPIVACAINE LIPOSOME 1.3 % IJ SUSP
INTRAMUSCULAR | Status: AC
  Filled 2022-04-10: qty 10

## 2022-04-10 MED ORDER — PHENYLEPHRINE 80 MCG/ML (10ML) SYRINGE FOR IV PUSH (FOR BLOOD PRESSURE SUPPORT)
PREFILLED_SYRINGE | INTRAVENOUS | Status: AC
  Filled 2022-04-10: qty 10

## 2022-04-10 MED ORDER — TIZANIDINE HCL 2 MG PO TABS: 2.0000 mg | ORAL_TABLET | Freq: Four times a day (QID) | ORAL | 0 refills | Status: DC | PRN

## 2022-04-10 MED ORDER — TRANEXAMIC ACID 1000 MG/10ML IV SOLN
INTRAVENOUS | Status: DC | PRN
  Administered 2022-04-10: 2000 mg via TOPICAL

## 2022-04-10 MED ORDER — TRANEXAMIC ACID 1000 MG/10ML IV SOLN
2000.0000 mg | INTRAVENOUS | Status: DC
  Filled 2022-04-10: qty 20

## 2022-04-10 MED ORDER — CEFAZOLIN SODIUM-DEXTROSE 2-4 GM/100ML-% IV SOLN
INTRAVENOUS | Status: AC
  Filled 2022-04-10: qty 100

## 2022-04-10 MED ORDER — PROPOFOL 10 MG/ML IV BOLUS
INTRAVENOUS | Status: AC
  Filled 2022-04-10: qty 20

## 2022-04-10 MED ORDER — LACTATED RINGERS IV BOLUS
250.0000 mL | Freq: Once | INTRAVENOUS | Status: AC
  Administered 2022-04-10: 250 mL via INTRAVENOUS

## 2022-04-10 MED ORDER — PROPOFOL 500 MG/50ML IV EMUL
INTRAVENOUS | Status: DC | PRN
  Administered 2022-04-10: 80 ug/kg/min via INTRAVENOUS

## 2022-04-10 MED ORDER — DEXAMETHASONE SODIUM PHOSPHATE 10 MG/ML IJ SOLN
INTRAMUSCULAR | Status: AC
  Filled 2022-04-10: qty 1

## 2022-04-10 MED ORDER — CEFAZOLIN SODIUM-DEXTROSE 2-4 GM/100ML-% IV SOLN
2.0000 g | Freq: Once | INTRAVENOUS | Status: AC
  Administered 2022-04-10: 2 g via INTRAVENOUS

## 2022-04-10 MED ORDER — VANCOMYCIN HCL 1500 MG/300ML IV SOLN
1500.0000 mg | INTRAVENOUS | Status: DC
  Filled 2022-04-10: qty 300

## 2022-04-10 MED ORDER — POVIDONE-IODINE 10 % EX SWAB
2.0000 | Freq: Once | CUTANEOUS | Status: AC
  Administered 2022-04-10: 2 via TOPICAL

## 2022-04-10 MED ORDER — LACTATED RINGERS IV SOLN: INTRAVENOUS | Status: DC

## 2022-04-10 MED ORDER — PHENYLEPHRINE 80 MCG/ML (10ML) SYRINGE FOR IV PUSH (FOR BLOOD PRESSURE SUPPORT)
PREFILLED_SYRINGE | INTRAVENOUS | Status: DC | PRN
  Administered 2022-04-10 (×2): 80 ug via INTRAVENOUS

## 2022-04-10 MED ORDER — EPHEDRINE 5 MG/ML INJ
INTRAVENOUS | Status: AC
  Filled 2022-04-10: qty 5

## 2022-04-10 MED ORDER — ACETAMINOPHEN 10 MG/ML IV SOLN: 1000.0000 mg | Freq: Once | INTRAVENOUS | Status: DC | PRN

## 2022-04-10 MED ORDER — DEXAMETHASONE SODIUM PHOSPHATE 10 MG/ML IJ SOLN
INTRAMUSCULAR | Status: DC | PRN
  Administered 2022-04-10: 8 mg via INTRAVENOUS

## 2022-04-10 MED ORDER — TRANEXAMIC ACID-NACL 1000-0.7 MG/100ML-% IV SOLN
1000.0000 mg | INTRAVENOUS | Status: AC
  Administered 2022-04-10: 1000 mg via INTRAVENOUS
  Filled 2022-04-10: qty 100

## 2022-04-10 MED ORDER — CEFAZOLIN SODIUM-DEXTROSE 2-4 GM/100ML-% IV SOLN: 2.0000 g | Freq: Four times a day (QID) | INTRAVENOUS | Status: DC

## 2022-04-10 MED ORDER — 0.9 % SODIUM CHLORIDE (POUR BTL) OPTIME
TOPICAL | Status: DC | PRN
  Administered 2022-04-10: 1000 mL

## 2022-04-10 MED ORDER — BUPIVACAINE-EPINEPHRINE (PF) 0.25% -1:200000 IJ SOLN
INTRAMUSCULAR | Status: AC
  Filled 2022-04-10: qty 30

## 2022-04-10 MED ORDER — BUPIVACAINE LIPOSOME 1.3 % IJ SUSP: 10.0000 mL | Freq: Once | INTRAMUSCULAR | Status: DC

## 2022-04-10 MED ORDER — FENTANYL CITRATE PF 50 MCG/ML IJ SOSY: 25.0000 ug | PREFILLED_SYRINGE | INTRAMUSCULAR | Status: DC | PRN

## 2022-04-10 MED ORDER — PROPOFOL 10 MG/ML IV BOLUS
INTRAVENOUS | Status: DC | PRN
  Administered 2022-04-10 (×10): 20 mg via INTRAVENOUS

## 2022-04-10 MED ORDER — ONDANSETRON HCL 4 MG/2ML IJ SOLN
INTRAMUSCULAR | Status: AC
  Filled 2022-04-10: qty 2

## 2022-04-10 MED ORDER — TRANEXAMIC ACID-NACL 1000-0.7 MG/100ML-% IV SOLN: 1000.0000 mg | Freq: Once | INTRAVENOUS | Status: AC

## 2022-04-10 MED ORDER — CEFAZOLIN SODIUM-DEXTROSE 2-4 GM/100ML-% IV SOLN
INTRAVENOUS | Status: AC
  Administered 2022-04-10: 2 g via INTRAVENOUS
  Filled 2022-04-10: qty 100

## 2022-04-10 MED ORDER — LACTATED RINGERS IV BOLUS
500.0000 mL | Freq: Once | INTRAVENOUS | Status: AC
  Administered 2022-04-10: 500 mL via INTRAVENOUS

## 2022-04-10 MED ORDER — ONDANSETRON HCL 4 MG/2ML IJ SOLN
INTRAMUSCULAR | Status: DC | PRN
  Administered 2022-04-10: 4 mg via INTRAVENOUS

## 2022-04-10 MED ORDER — BUPIVACAINE IN DEXTROSE 0.75-8.25 % IT SOLN
INTRATHECAL | Status: DC | PRN
  Administered 2022-04-10: 1.8 mL via INTRATHECAL

## 2022-04-10 MED ORDER — ORAL CARE MOUTH RINSE: 15.0000 mL | Freq: Once | OROMUCOSAL | Status: AC

## 2022-04-10 MED ORDER — EPHEDRINE SULFATE-NACL 50-0.9 MG/10ML-% IV SOSY
PREFILLED_SYRINGE | INTRAVENOUS | Status: DC | PRN
  Administered 2022-04-10: 5 mg via INTRAVENOUS

## 2022-04-10 MED ORDER — PROMETHAZINE HCL 12.5 MG PO TABS: 12.5000 mg | ORAL_TABLET | Freq: Four times a day (QID) | ORAL | 1 refills | Status: DC | PRN

## 2022-04-10 MED ORDER — BUPIVACAINE-EPINEPHRINE (PF) 0.25% -1:200000 IJ SOLN
INTRAMUSCULAR | Status: DC | PRN
  Administered 2022-04-10: 30 mL

## 2022-04-10 MED ORDER — TRANEXAMIC ACID-NACL 1000-0.7 MG/100ML-% IV SOLN
INTRAVENOUS | Status: AC
  Administered 2022-04-10: 1000 mg via INTRAVENOUS
  Filled 2022-04-10: qty 100

## 2022-04-10 MED ORDER — HYDROCODONE-ACETAMINOPHEN 5-325 MG PO TABS: 1.0000 | ORAL_TABLET | Freq: Four times a day (QID) | ORAL | 0 refills | Status: AC | PRN

## 2022-04-10 MED ORDER — ASPIRIN EC 81 MG PO TBEC: 81.0000 mg | DELAYED_RELEASE_TABLET | Freq: Two times a day (BID) | ORAL | 11 refills | Status: AC

## 2022-04-10 MED ORDER — BUPIVACAINE LIPOSOME 1.3 % IJ SUSP
INTRAMUSCULAR | Status: DC | PRN
  Administered 2022-04-10: 10 mL

## 2022-04-10 MED ORDER — CHLORHEXIDINE GLUCONATE 0.12 % MT SOLN
15.0000 mL | Freq: Once | OROMUCOSAL | Status: AC
  Administered 2022-04-10: 15 mL via OROMUCOSAL

## 2022-04-10 SURGICAL SUPPLY — 46 items
BAG COUNTER SPONGE SURGICOUNT (BAG) IMPLANT
BAG DECANTER FOR FLEXI CONT (MISCELLANEOUS) ×2 IMPLANT
BAG SPNG CNTER NS LX DISP (BAG)
BLADE SAW SGTL 18X1.27X75 (BLADE) ×2 IMPLANT
BOOTIES KNEE HIGH SLOAN (MISCELLANEOUS) ×2 IMPLANT
CATH FOLEY 2W COUNCIL 5CC 16FR (CATHETERS) IMPLANT
CELLS DAT CNTRL 66122 CELL SVR (MISCELLANEOUS) ×2 IMPLANT
COVER PERINEAL POST (MISCELLANEOUS) ×2 IMPLANT
COVER SURGICAL LIGHT HANDLE (MISCELLANEOUS) ×2 IMPLANT
CUP ACETABULAR GRIPTON 100 52 (Orthopedic Implant) IMPLANT
DRAPE FOOT SWITCH (DRAPES) ×2 IMPLANT
DRAPE IMP U-DRAPE 54X76 (DRAPES) ×2 IMPLANT
DRAPE STERI IOBAN 125X83 (DRAPES) ×2 IMPLANT
DRAPE U-SHAPE 47X51 STRL (DRAPES) ×4 IMPLANT
DRSG AQUACEL AG ADV 3.5X 6 (GAUZE/BANDAGES/DRESSINGS) ×2 IMPLANT
DURAPREP 26ML APPLICATOR (WOUND CARE) ×2 IMPLANT
ELECT BLADE TIP CTD 4 INCH (ELECTRODE) ×2 IMPLANT
ELECT REM PT RETURN 15FT ADLT (MISCELLANEOUS) ×2 IMPLANT
ELIMINATOR HOLE APEX DEPUY (Hips) IMPLANT
GLOVE BIOGEL PI IND STRL 8 (GLOVE) ×4 IMPLANT
GOWN STRL REUS W/ TWL XL LVL3 (GOWN DISPOSABLE) ×4 IMPLANT
GOWN STRL REUS W/TWL XL LVL3 (GOWN DISPOSABLE) ×4
GRIPTON 100 52 (Orthopedic Implant) ×2 IMPLANT
GUIDEWIRE STR DUAL SENSOR (WIRE) IMPLANT
HEAD M SROM 36MM PLUS 1.5 (Hips) IMPLANT
HOLDER FOLEY CATH W/STRAP (MISCELLANEOUS) ×2 IMPLANT
KIT TURNOVER KIT A (KITS) IMPLANT
LINER NEUTRAL 52X36MM PLUS 4 (Liner) IMPLANT
MANIFOLD NEPTUNE II (INSTRUMENTS) ×2 IMPLANT
NEEDLE HYPO 22GX1.5 SAFETY (NEEDLE) ×2 IMPLANT
NS IRRIG 1000ML POUR BTL (IV SOLUTION) ×2 IMPLANT
PACK ANTERIOR HIP CUSTOM (KITS) ×2 IMPLANT
PENCIL SMOKE EVACUATOR (MISCELLANEOUS) IMPLANT
PROTECTOR NERVE ULNAR (MISCELLANEOUS) ×2 IMPLANT
RETRACTOR WND ALEXIS 18 MED (MISCELLANEOUS) ×2 IMPLANT
RTRCTR WOUND ALEXIS 18CM MED (MISCELLANEOUS) ×2
SPIKE FLUID TRANSFER (MISCELLANEOUS) ×2 IMPLANT
SROM M HEAD 36MM PLUS 1.5 (Hips) ×2 IMPLANT
STEM FEMORAL SZ 6MM STD ACTIS (Stem) IMPLANT
SUT ETHIBOND NAB CT1 #1 30IN (SUTURE) ×4 IMPLANT
SUT VIC AB 1 CT1 36 (SUTURE) ×2 IMPLANT
SUT VIC AB 2-0 CT1 27 (SUTURE) ×2
SUT VIC AB 2-0 CT1 TAPERPNT 27 (SUTURE) ×2 IMPLANT
SUT VICRYL AB 3-0 FS1 BRD 27IN (SUTURE) ×2 IMPLANT
SUT VLOC 180 0 24IN GS25 (SUTURE) ×2 IMPLANT
SYR 50ML LL SCALE MARK (SYRINGE) ×2 IMPLANT

## 2022-04-10 NOTE — Consult Note (Signed)
Urology Consult   Physician requesting consult: Daldorf  Reason for consult: Foley placement  History of Present Illness: Edwin [name] is a 81 y.o. white male with prior history of prostate cancer.  Underwent radical prostatectomy in 2008 followed by salvage radiation completed in 2011.  He is scheduled for total hip today and nurses unable to place Foley catheter at the beginning of the case.  I was asked to assist in placing Foley catheter.  Flexible cystoscopy was performed after patient was under spinal anesthesia.  Showed concentric tight bladder neck contracture.  I was able to pass sensor wire through the contracture into the bladder without difficulty.  Cystoscope was removed leaving the guidewire in place.  Utilizing [place] sounds starting at 42 [place] and dilated up to 18 [place] over the guidewire.  Subsequently placed [place] Foley successfully into the bladder with return of clear urine.  He denies a history of voiding or storage urinary symptoms, hematuria, UTIs, STDs, urolithiasis, GU malignancy/trauma/surgery.  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/2017   left lat neck, excision RESIDUAL BASAL CELL CARCINOMA, MARGINS FREE   BCC (basal cell carcinoma of skin) 03/13/2017   left lateral neck  BASAL CELL CARCINOMA, NODULAR PATTERN   BCC (basal cell carcinoma of skin) 12/25/2012   left pretibial   BCC (basal cell carcinoma of skin) 05/21/2007   left medial calf - , 0.8 X 0.5CM: SUPERFICIAL BASAL CELL CARCINOMA, BASE INVOLVED   BPH (benign prostatic hyperplasia)    Cancer (HCC)    Prostate Cancer   Dysrhythmia    SVT   Hyperlipidemia    Hypertension    PTSD (post-traumatic stress disorder)    Pulmonary nodules    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   Current Outpatient Medications on File Prior to Encounter  Medication Sig Dispense Refill   aspirin EC 81 MG tablet Take 81 mg by mouth daily.     Cholecalciferol (VITAMIN D3 PO) Take 1 tablet by mouth daily.     co-enzyme Q-10 30 MG capsule Take 30 mg by mouth 3 (three) times daily.     Cyanocobalamin (B-12 PO) Take 1 tablet by mouth daily.     Krill Oil 1000 MG CAPS Take 2,000 mg by mouth daily.     MAGNESIUM GLUCONATE PO Take 400 mg by mouth at bedtime.     Multiple Vitamin (MULTIVITAMIN) tablet Take 1 tablet by mouth daily.     Ubiquinol 50 MG CAPS Take 50 mg by mouth daily.     valsartan (DIOVAN) 80 MG tablet Take 80 mg by mouth 2 (two) times daily.     Vitamin D, Ergocalciferol, (DRISDOL) 1.25 MG (50000 UNIT) CAPS capsule Take 1 capsule (50,000 Units total) by mouth every 7 (seven) days. 12 capsule 0  Vitamin D, Ergocalciferol, (DRISDOL) 1.25 MG (50000 UNIT) CAPS capsule Take 1 capsule (50,000 Units total) by mouth every 7 (seven) days. 12 capsule 0   metoprolol tartrate (LOPRESSOR) 100 MG tablet Take 1 tablet (100 mg total) by mouth once for 1 dose. Please take one time dose '100mg'$  metoprolol tartrate 2 hr prior to cardiac CT for HR control IF HR >55bpm. 1 tablet 0   predniSONE (DELTASONE) 20 MG tablet Take 1 tablet (20 mg total) by mouth daily with breakfast. (Patient not taking: Reported on 03/28/2022) 5 tablet 0   tiZANidine (ZANAFLEX) 2 MG tablet Take 1 tablet (2 mg total) by mouth at bedtime. (Patient not taking: Reported on  03/28/2022) 30 tablet 0     Scheduled Meds:  bupivacaine liposome  10 mL Other Once   tranexamic acid (CYKLOKAPRON) 2,000 mg in sodium chloride 0.9 % 50 mL Topical Application  7,353 mg Topical To OR   Continuous Infusions:  lactated ringers     lactated ringers 10 mL/hr at 04/10/22 0837   tranexamic acid     PRN Meds:.  Allergies:  Allergies  Allergen Reactions   Amoxicillin-Pot Clavulanate Diarrhea   Bacitracin-Neomycin-Polymyxin Hives   Hydrochlorothiazide    Lipitor [Atorvastatin]     Muscle soreness   Lisinopril Cough   Bacitracin-Polymyxin B Rash   Neosporin [Neomycin-Bacitracin Zn-Polymyx] Rash    History reviewed. No pertinent family history.  Social History:  reports that he quit smoking about 56 years ago. His smoking use included cigarettes. He has a 3.50 pack-year smoking history. He has never used smokeless tobacco. He reports that he does not drink alcohol and does not use drugs.  ROS: A complete review of systems was performed.  All systems are negative except for pertinent findings as noted.  Physical Exam:  Vital signs in last 24 hours: Temp:  [99 F (37.2 C)] 99 F (37.2 C) (09/19 0825) Pulse Rate:  [81] 81 (09/19 0825) Resp:  [16] 16 (09/19 0825) BP: (153)/(89) 153/89 (09/19 0825) SpO2:  [98 %] 98 % (09/19 0825) Weight:  [101.2 kg] 101.2 kg (09/19 0825) Constitutional:  Alert and oriented, No acute distress Cardiovascular: Regular rate and rhythm, No JVD Respiratory: Normal respiratory effort, Lungs clear bilaterally GI: Abdomen is soft, nontender, nondistended, no abdominal masses GU: No CVA tenderness Lymphatic: No lymphadenopathy Neurologic: Grossly intact, no focal deficits Psychiatric: Normal mood and affect  Laboratory Data:  No results for input(s): "WBC", "HGB", "HCT", "PLT" in the last 72 hours.  No results for input(s): "NA", "K", "CL", "GLUCOSE", "BUN", "CALCIUM", "CREATININE" in the last 72 hours.  Invalid input(s):  "CO3"   Results for orders placed or performed during the hospital encounter of 04/10/22 (from the past 24 hour(s))  ABO/Rh     Status: None   Collection Time: 04/10/22  8:26 AM  Result Value Ref Range   ABO/RH(D)      A POS Performed at [facility], [place] [address], [place], [place] [address]    Recent Results (from the past 240 hour(s))  Surgical pcr screen     Status: None   Collection Time: 04/02/22 11:10 AM   Specimen: Nasal Mucosa; Nasal Swab  Result Value Ref Range Status   MRSA, PCR NEGATIVE NEGATIVE Final   Staphylococcus aureus NEGATIVE NEGATIVE Final    Comment: (NOTE) The Xpert SA Assay (FDA approved for NASAL specimens in patients 62 years of age and older), is one component of a comprehensive surveillance program. It is not intended to  diagnose infection nor to guide or monitor treatment. Performed at [facility], [place] [address] [place], [place] [address]     Renal Function: No results for input(s): "CREATININE" in the last 168 hours. Estimated Creatinine Clearance: 81.4 mL/min (by C-G formula based on SCr of 0.92 mg/dL).  Radiologic Imaging: No results found.  I independently reviewed the above imaging studies.  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  Recommend leaving Foley for 3 days then can discontinue for voiding trial. Edwin [name] 04/10/2022, 11:05 AM    Remi Haggard, MD   CC: Latanya Maudlin

## 2022-04-10 NOTE — Anesthesia Postprocedure Evaluation (Signed)
Anesthesia Post Note  Patient: Edwin [name]  Procedure(s) Performed: RIGHT TOTAL HIP ARTHROPLASTY ANTERIOR APPROACH (Right: Hip) CYSTOSCOPY with bladder neck dilation. (Urethra)     Patient location during evaluation: PACU Anesthesia Type: MAC and Spinal Level of consciousness: awake and alert Pain management: pain level controlled Vital Signs Assessment: post-procedure vital signs reviewed and stable Respiratory status: spontaneous breathing, nonlabored ventilation, respiratory function stable and patient connected to nasal cannula oxygen Cardiovascular status: stable and blood pressure returned to baseline Postop Assessment: no apparent nausea or vomiting Anesthetic complications: no   No notable events documented.  Last Vitals:  Vitals:   04/10/22 1330 04/10/22 1332  BP: 137/76 137/76  Pulse: 66 73  Resp:  14  Temp:  (!) 36.4 C  SpO2: 100% 99%    Last Pain:  Vitals:   04/10/22 1332  TempSrc: Axillary  PainSc: 0-No pain                 Belenda Cruise P Fusako Tanabe

## 2022-04-10 NOTE — Anesthesia Procedure Notes (Signed)
Spinal  Patient location during procedure: OR Start time: 04/10/2022 10:24 AM End time: 04/10/2022 10:25 AM Staffing Performed: anesthesiologist  Anesthesiologist: Darral Dash, DO Performed by: Darral Dash, DO Authorized by: Darral Dash, DO   Preanesthetic Checklist Completed: patient identified, IV checked, site marked, risks and benefits discussed, surgical consent, monitors and equipment checked, pre-op evaluation and timeout performed Spinal Block Patient position: sitting Prep: DuraPrep Patient monitoring: heart rate, cardiac monitor, continuous pulse ox and blood pressure Approach: midline Location: L4-5 Injection technique: single-shot Needle Needle type: Pencan  Needle gauge: 24 G Needle length: 10 cm Assessment Events: CSF return Additional Notes Patient identified. Risks/Benefits/Options discussed with patient including but not limited to bleeding, infection, nerve damage, paralysis, failed block, incomplete pain control, headache, blood pressure changes, nausea, vomiting, reactions to medications, itching and postpartum back pain. Confirmed with bedside nurse the patient's most recent platelet count. Confirmed with patient that they are not currently taking any anticoagulation, have any bleeding history or any family history of bleeding disorders. Patient expressed understanding and wished to proceed. All questions were answered. Sterile technique was used throughout the entire procedure. Please see nursing notes for vital signs. Warning signs of high block given to the patient including shortness of breath, tingling/numbness in hands, complete motor block, or any concerning symptoms with instructions to call for help. Patient was given instructions on fall risk and not to get out of bed. All questions and concerns addressed with instructions to call with any issues or inadequate analgesia.

## 2022-04-10 NOTE — Transfer of Care (Signed)
Immediate Anesthesia Transfer of Care Note  Patient: Edwin [name]  Procedure(s) Performed: RIGHT TOTAL HIP ARTHROPLASTY ANTERIOR APPROACH (Right: Hip) CYSTOSCOPY with bladder neck dilation. (Urethra)  Patient Location: PACU  Anesthesia Type:Spinal  Level of Consciousness: drowsy  Airway & Oxygen Therapy: Patient Spontanous Breathing and Patient connected to face mask oxygen  Post-op Assessment: Report given to RN and Post -op Vital signs reviewed and stable  Post vital signs: Reviewed and stable  Last Vitals:  Vitals Value Taken Time  BP 90/58   Temp    Pulse 67   Resp 18 04/10/22 1225  SpO2 94   Vitals shown include unvalidated device data.  Last Pain:  Vitals:   04/10/22 0835  TempSrc:   PainSc: 0-No pain         Complications: No notable events documented.

## 2022-04-10 NOTE — Evaluation (Signed)
Physical Therapy Evaluation Patient Details Name: Edwin [name] MRN: [id number] DOB: [birth year] Today's Date: 04/10/2022  History of Present Illness  pt is a 81yo male presenting s/p R-THA, AA on 04/10/22. PMH: BPH, hx of prostate cancer, HLD, HTN, PTSD, "back surgery," supraventricular tachycardia  Clinical Impression  Edwin [name] is a 81 y.o. male POD 0 s/p R-THA,AA. Patient reports independence with mobility at baseline. Patient is now limited by functional impairments (see PT problem list below) and requires min guard for transfers and gait with RW. Patient was able to ambulate 40 feet with RW and min guard and cues for safe walker management. Patient educated on safe sequencing for stair mobility and verbalized safe guarding position for people assisting with mobility. Patient instructed in exercises to facilitate ROM and circulation. Patient will benefit from continued skilled PT interventions to address impairments and progress towards PLOF. Patient has met mobility goals at adequate level for discharge home; will continue to follow if pt continues acute stay to progress towards Mod I goals.       Recommendations for follow up therapy are one component of a multi-disciplinary discharge planning process, led by the attending physician.  Recommendations may be updated based on patient status, additional functional criteria and insurance authorization.  Follow Up Recommendations Follow physician's recommendations for discharge plan and follow up therapies      Assistance Recommended at Discharge Intermittent Supervision/Assistance  Patient can return home with the following  A little help with walking and/or transfers;A little help with bathing/dressing/bathroom;Assistance with cooking/housework;Assist for transportation;Help with stairs or ramp for entrance    Equipment Recommendations Rolling walker (2 wheels)  Recommendations for Other Services       Functional Status Assessment  Patient has had a recent decline in their functional status and demonstrates the ability to make significant improvements in function in a reasonable and predictable amount of time.     Precautions / Restrictions Precautions Precautions: Fall Restrictions Weight Bearing Restrictions: No Other Position/Activity Restrictions: wbat      Mobility  Bed Mobility Overal bed mobility: Needs Assistance Bed Mobility: Supine to Sit     Supine to sit: Min guard     General bed mobility comments: For safety only, no physical assist required    Transfers Overall transfer level: Needs assistance Equipment used: Rolling walker (2 wheels) Transfers: Sit to/from Stand Sit to Stand: Min guard, From elevated surface           General transfer comment: Pt completed transfers x2, min guard from elevated stretcher surface, no physical assist required. VCs for hand placement and powering up with BUE and LLE    Ambulation/Gait Ambulation/Gait assistance: Min guard Gait Distance (Feet): 40 Feet Assistive device: Rolling walker (2 wheels) Gait Pattern/deviations: Step-to pattern, Decreased dorsiflexion - left, Decreased weight shift to right Gait velocity: decreased     General Gait Details: Pt ambulated with RW and min guard, no physical assist required or overt LOB noted. Pt with history of peripheral neuropathy and reporting decreased senstaion bilaterally. Reduced DF on L foot. Mild foot inversion on R that reduced to neutral with increased ambulation.  Stairs Stairs: Yes Stairs assistance: Min assist Stair Management: No rails, Step to pattern, Backwards, With walker Number of Stairs: 2 General stair comments: Educated pt and wife on backwards stair mobility and handout provided. Wife provided min assist for steadying RW and min guard with gait belt, PT provided additional for increased safety. Both demonstrated safe stair mobility with VCs for sequencing,  no overt LOB  noted  Wheelchair Mobility    Modified Rankin (Stroke Patients Only)       Balance Overall balance assessment: Needs assistance Sitting-balance support: No upper extremity supported, Feet supported Sitting balance-Leahy Scale: Good     Standing balance support: Bilateral upper extremity supported, During functional activity, Reliant on assistive device for balance Standing balance-Leahy Scale: Poor                               Pertinent Vitals/Pain Pain Assessment Pain Assessment: 0-10 Pain Score: 4  Pain Location: low back, r hip Pain Descriptors / Indicators: Operative site guarding, Discomfort Pain Intervention(s): Limited activity within patient's tolerance, Monitored during session, Repositioned, Ice applied    Home Living Family/patient expects to be discharged to:: Private residence Living Arrangements: Spouse/significant other Available Help at Discharge: Family;Available 24 hours/day Type of Home: House Home Access: Stairs to enter Entrance Stairs-Rails: None Entrance Stairs-Number of Steps: 2   Home Layout: One level Home Equipment: Grab bars - tub/shower;BSC/3in1      Prior Function Prior Level of Function : Independent/Modified Independent;Driving;Working/employed Actuary)             Mobility Comments: IND ADLs Comments: IND     Hand Dominance   Dominant Hand: Right    Extremity/Trunk Assessment   Upper Extremity Assessment Upper Extremity Assessment: Overall WFL for tasks assessed    Lower Extremity Assessment Lower Extremity Assessment: RLE deficits/detail;LLE deficits/detail RLE Deficits / Details: MMT ank DF/PF 5/5 RLE Sensation: decreased light touch LLE Deficits / Details: MMT ank PF 5/5, DF 3+/5, pt with history of foot drop and peripheral neuropathy, able to DF to neutral only. LLE Sensation: history of peripheral neuropathy;decreased light touch    Cervical / Trunk Assessment Cervical / Trunk  Assessment: Kyphotic;Back Surgery  Communication   Communication: Prefers language other than English  Cognition Arousal/Alertness: Awake/alert Behavior During Therapy: WFL for tasks assessed/performed Overall Cognitive Status: Within Functional Limits for tasks assessed                                          General Comments General comments (skin integrity, edema, etc.): Wife [name] present    Exercises Total Joint Exercises Ankle Circles/Pumps: AROM, Both, 10 reps Quad Sets: AROM, Right, 5 reps Heel Slides: AROM, Right, Other reps (comment) (3) Hip ABduction/ADduction: AROM, Both, 5 reps (Standing: educated not performed) Long Arc Quad: AROM, Right, Other reps (comment) (2) Marching in Standing: Other (comment) (educated not performed) Standing Hip Extension: Other (comment) (educated not performed)   Assessment/Plan    PT Assessment Patient needs continued PT services  PT Problem List Decreased strength;Decreased range of motion;Decreased activity tolerance;Decreased balance;Decreased mobility;Decreased coordination;Pain       PT Treatment Interventions DME instruction;Gait training;Stair training;Functional mobility training;Therapeutic activities;Therapeutic exercise;Balance training;Neuromuscular re-education;Patient/family education    PT Goals (Current goals can be found in the Care Plan section)  Acute Rehab PT Goals Patient Stated Goal: get back to cycling PT Goal Formulation: With patient Time For Goal Achievement: 04/17/22 Potential to Achieve Goals: Good    Frequency 7X/week     Co-evaluation               AM-PAC PT "6 Clicks" Mobility  Outcome Measure Help needed turning from your back to your side while in a flat bed  without using bedrails?: A Little Help needed moving from lying on your back to sitting on the side of a flat bed without using bedrails?: A Little Help needed moving to and from a bed to a chair (including a  wheelchair)?: A Little Help needed standing up from a chair using your arms (e.g., wheelchair or bedside chair)?: A Little Help needed to walk in hospital room?: A Little Help needed climbing 3-5 steps with a railing? : A Little 6 Click Score: 18    End of Session Equipment Utilized During Treatment: Gait belt Activity Tolerance: Patient tolerated treatment well;No increased pain Patient left: in chair;with call bell/phone within reach;with family/visitor present Nurse Communication: Mobility status PT Visit Diagnosis: Pain;Difficulty in walking, not elsewhere classified (R26.2) Pain - Right/Left: Right Pain - part of body: Hip    Time: 1449-1530 PT Time Calculation (min) (ACUTE ONLY): 41 min   Charges:   PT Evaluation $PT Eval Low Complexity: 1 Low PT Treatments $Gait Training: 8-22 mins $Therapeutic Exercise: 8-22 mins        Coolidge Breeze, PT, DPT [facility] Department Office: [phone]  Coolidge Breeze 04/10/2022, 3:42 PM

## 2022-04-10 NOTE — Anesthesia Preprocedure Evaluation (Signed)
Anesthesia Evaluation  Patient identified by MRN, date of birth, ID band Patient awake    Reviewed: Allergy & Precautions, NPO status , Patient's Chart, lab work & pertinent test results  Airway Mallampati: II  TM Distance: >3 FB Neck ROM: Full    Dental no notable dental hx.    Pulmonary neg pulmonary ROS, former smoker,    Pulmonary exam normal        Cardiovascular hypertension, Pt. on medications + CAD   Rhythm:Regular Rate:Normal     Neuro/Psych Anxiety negative neurological ROS     GI/Hepatic negative GI ROS, Neg liver ROS,   Endo/Other  negative endocrine ROS  Renal/GU negative Renal ROS  negative genitourinary   Musculoskeletal  (+) Arthritis , Osteoarthritis,    Abdominal Normal abdominal exam  (+)   Peds  Hematology negative hematology ROS (+)   Anesthesia Other Findings   Reproductive/Obstetrics                             Anesthesia Physical Anesthesia Plan  ASA: 3  Anesthesia Plan: MAC and Spinal   Post-op Pain Management:    Induction: Intravenous  PONV Risk Score and Plan: 1 and Ondansetron, Dexamethasone, Propofol infusion and Treatment may vary due to age or medical condition  Airway Management Planned: Simple Face Mask, Natural Airway and Nasal Cannula  Additional Equipment: None  Intra-op Plan:   Post-operative Plan:   Informed Consent: I have reviewed the patients History and Physical, chart, labs and discussed the procedure including the risks, benefits and alternatives for the proposed anesthesia with the patient or authorized representative who has indicated his/her understanding and acceptance.     Dental advisory given  Plan Discussed with: CRNA  Anesthesia Plan Comments: (Lab Results      Component                Value               Date                      WBC                      6.7                 04/02/2022                HGB                       14.8                04/02/2022                HCT                      44.6                04/02/2022                MCV                      94.3                04/02/2022                PLT  222                 04/02/2022           Lab Results      Component                Value               Date                      NA                       138                 04/02/2022                K                        4.1                 04/02/2022                CO2                      26                  04/02/2022                GLUCOSE                  117 (H)             04/02/2022                BUN                      15                  04/02/2022                CREATININE               0.92                04/02/2022                CALCIUM                  9.0                 04/02/2022                GFRNONAA                 >60                 04/02/2022          )        Anesthesia Quick Evaluation

## 2022-04-10 NOTE — Op Note (Signed)
PRE-OP DIAGNOSIS:  RIGHT HIP DEGENERATIVE JOINT DISEASE POST-OP DIAGNOSIS:  same PROCEDURE: RIGHT TOTAL HIP ARTHROPLASTY ANTERIOR APPROACH ANESTHESIA:  Spinal and MAC SURGEON:  Melrose Nakayama MD ASSISTANT:  Loni Dolly PA-C   INDICATIONS FOR PROCEDURE:  The patient is a 81 y.o. male with a long history of a painful hip.  This has persisted despite multiple conservative measures.  The patient has persisted with pain and dysfunction making rest and activity difficult.  A total hip replacement is offered as surgical treatment.  Informed operative consent was obtained after discussion of possible complications including reaction to anesthesia, infection, neurovascular injury, dislocation, DVT, PE, and death.  The importance of the postoperative rehab program to optimize result was stressed with the patient.  SUMMARY OF FINDINGS AND PROCEDURE:  Under the above anesthesia through a anterior approach an the Hana table a right THR was performed.  The patient had severe degenerative change and good bone quality.  We used DePuy components to replace the hip and these were size 6 Actis femur capped with a +1.5 36 mm metal hip ball.  On the acetabular side we used a size 52 Gription shell with a  plus 4 neutral polyethylene liner.  We did use a hole eliminator.  Loni Dolly PA-C assisted throughout and was invaluable to the completion of the case in that he helped position and retract while I performed the procedure.  He also closed simultaneously to help minimize OR time.  I used fluoroscopy throughout the case to check position of implants and leg lengths and read all of these views myself.  DESCRIPTION OF PROCEDURE:  The patient was taken to the OR suite where the above anesthetic was applied.  We consulted Dr Milford Cage of Urology for placement of a catheter.  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 The patient was then positioned on the Hana table  supine.  All bony prominences were appropriately padded.  Prep and drape was then performed in normal sterile fashion.  The patient was given kefzol preoperative antibiotic and an appropriate time out was performed.  We then took an anterior approach to the right hip.  Dissection was taken through adipose to the tensor fascia lata fascia.  This structure was incised longitudinally and we dissected in the intermuscular interval just medial to this muscle.  Cobra retractors were placed superior and inferior to the femoral neck superficial to the capsule.  A capsular incision was then made and the retractors were placed along the femoral neck.  Xray was brought in to get a good level for the femoral neck cut which was made with an oscillating saw and osteotome.  The femoral head was removed with a corkscrew.  The acetabulum was exposed and some labral tissues were excised. Reaming was taken to the inside wall of the pelvis and sequentially up to 1 mm smaller than the actual component.  A trial of components was done and then the aforementioned acetabular shell was placed in appropriate tilt and anteversion confirmed by fluoroscopy. The liner was placed along with the hole eliminator and attention was turned to the femur.  The leg was brought down and over into adduction and the elevator bar was used to raise the femur up gently in the wound.  The piriformis was released with care taken to preserve the obturator internus attachment and all of the posterior capsule. The femur was reamed and then broached to the appropriate size.  A trial reduction was done and the aforementioned head and neck assembly gave [place] the best stability in extension with external rotation.  Leg lengths were felt to be about equal by fluoroscopic exam.  The trial components were removed and the wound irrigated.  We then placed the femoral component in appropriate anteversion.  The head was applied to a dry stem neck and the hip again reduced.  It  was again stable in the aforementioned position.  The would was irrigated again followed by re-approximation of anterior capsule with ethibond suture. Tensor fascia was repaired with V-loc suture  followed by deep closure with #O and #2 undyed vicryl.  Skin was closed with subQ stitch and steristrips followed by a sterile dressing.  EBL and IOF can be obtained from anesthesia records.  DISPOSITION:  The patient was taken to PACU in stable condition to potentially go home same day depending on ability to walk and tolerate liquids.  Dr Milford Cage recommended leaving the catheter for 3 days.

## 2022-04-10 NOTE — Discharge Instructions (Signed)
Remove catheter in 3 days.

## 2022-04-10 NOTE — Anesthesia Procedure Notes (Signed)
Procedure Name: MAC Date/Time: 04/10/2022 10:19 AM  Performed by: Niel Hummer, CRNAPre-anesthesia Checklist: Patient identified, Emergency Drugs available, Suction available and Patient being monitored Oxygen Delivery Method: Simple face mask

## 2022-04-10 NOTE — Op Note (Signed)
Preoperative diagnosis:  1.  Bladder neck contracture Postoperative diagnosis: 1.  Bladder neck contracture  Procedure(s): 1.  Flexible cystoscopy, dilation of bladder neck contracture, insertion Foley catheter  Surgeon: Dr. Harold Barban  Anesthesia: Spinal  Complications: None  EBL: Minimal  Specimens: None  Disposition of specimens: Not applicable  Intraoperative findings: Tight concentric bladder neck contracture, dilated over wire utilizing LeFort sounds.  72 [place] council Foley placed  Indication: Patient is a 81 year old white male history of prostate cancer status post prostatectomy 2008.  Had salvage radiation in 2011.  He is scheduled for total hip procedure today by orthopedics and nurses were unable to get Foley catheter in place beginning of the case.  I was asked to assist in Foley placement.  Description of procedure:  Patient was under spinal anesthesia when I entered the room.  He was in stable condition.  I performed flexible cystoscopy after prepping the penis in sterile fashion.  This revealed tight bladder neck contracture.  I was able to pass guidewire through the bladder neck contracture in the bladder.  Subsequent dilated the bladder neck utilizing LeFort sounds starting at 46 [place] and increasing up to 18 [place].  64 [place] council Foley was then placed over the guidewire in the bladder and 10 cc placed in the balloon.  There was return of clear urine noted.  Foley was placed to gravity drain.  This terminated the urologic portion of the procedure.  Orthopedic procedure be dictated under separate cover.  He was stable at termination of this portion of the procedure.

## 2022-04-10 NOTE — Interval H&P Note (Signed)
History and Physical Interval Note:  04/10/2022 9:25 AM  Edwin [name]  has presented today for surgery, with the diagnosis of RIGHT HIP DEGENERATIVE JOINT DISEASE.  The various methods of treatment have been discussed with the patient and family. After consideration of risks, benefits and other options for treatment, the patient has consented to  Procedure(s): RIGHT TOTAL HIP ARTHROPLASTY ANTERIOR APPROACH (Right) as a surgical intervention.  The patient's history has been reviewed, patient examined, no change in status, stable for surgery.  I have reviewed the patient's chart and labs.  Questions were answered to the patient's satisfaction.     Hessie Dibble

## 2022-04-11 ENCOUNTER — Encounter (HOSPITAL_COMMUNITY): Payer: Self-pay | Admitting: Orthopaedic Surgery

## 2022-05-14 ENCOUNTER — Ambulatory Visit (INDEPENDENT_AMBULATORY_CARE_PROVIDER_SITE_OTHER): Payer: Medicare Other | Admitting: Family Medicine

## 2022-05-14 VITALS — Ht 73.0 in

## 2022-05-14 DIAGNOSIS — M9903 Segmental and somatic dysfunction of lumbar region: Secondary | ICD-10-CM | POA: Diagnosis not present

## 2022-05-14 DIAGNOSIS — M5030 Other cervical disc degeneration, unspecified cervical region: Secondary | ICD-10-CM

## 2022-05-14 DIAGNOSIS — M9902 Segmental and somatic dysfunction of thoracic region: Secondary | ICD-10-CM | POA: Diagnosis not present

## 2022-05-14 DIAGNOSIS — M9901 Segmental and somatic dysfunction of cervical region: Secondary | ICD-10-CM

## 2022-05-14 NOTE — Patient Instructions (Signed)
Probiotic 10 diff strains, 10 billion units See me in 5-6 weeks

## 2022-05-14 NOTE — Progress Notes (Signed)
[facility] [place] Phone: [phone] Subjective:    I'm seeing this patient by the request  of:  [name], MD  CC: Neck and upper back pain with patient recently having hip replacement  WCB:[id number]  LOMAX POEHLER is a 81 y.o. male coming in with complaint of back and neck pain. OMT 04/05/2022. Patient states he does have tightness noted.  More of the neck.  Wanting to know if he could potentially have some manipulation.  Patient states it seems to be moderately worse since his hip replacement.  Has not been as active.  Does feel though that the hip replacement is doing very well.  Medications patient has been prescribed: None  Taking:         Reviewed prior external information including notes and imaging from previsou exam, outside providers and external EMR if available.   As well as notes that were available from care everywhere and other healthcare systems.  Past medical history, social, surgical and family history all reviewed in electronic medical record.  No pertanent information unless stated regarding to the chief complaint.   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/2017   left lat neck, excision RESIDUAL BASAL CELL CARCINOMA, MARGINS FREE   BCC (basal cell carcinoma of skin) 03/13/2017   left lateral neck  BASAL CELL CARCINOMA, NODULAR PATTERN   BCC (basal cell carcinoma of skin) 12/25/2012   left pretibial   BCC (basal cell carcinoma of skin) 05/21/2007   left medial calf - , 0.8 X 0.5CM: SUPERFICIAL BASAL CELL CARCINOMA, BASE INVOLVED   BPH (benign prostatic hyperplasia)    Cancer (HCC)    Prostate Cancer   Dysrhythmia    SVT   Hyperlipidemia    Hypertension    PTSD (post-traumatic stress disorder)     Pulmonary nodules    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   right inferior cheek mandible SQUAMOUS CELL CARCINOMA IN SITU    Allergies  Allergen Reactions   Amoxicillin-Pot Clavulanate Diarrhea   Bacitracin-Neomycin-Polymyxin Hives   Hydrochlorothiazide    Lipitor [Atorvastatin]     Muscle soreness   Lisinopril Cough   Bacitracin-Polymyxin B Rash   Neosporin [Neomycin-Bacitracin Zn-Polymyx] Rash     Review of Systems:  No headache, visual changes, nausea, vomiting, diarrhea, constipation, dizziness, abdominal pain, skin rash, fevers, chills, night sweats, weight loss, swollen lymph nodes, body aches, joint swelling, chest pain, shortness of breath, mood changes. POSITIVE muscle aches  Objective  Height '6\' 1"'$  (1.854 m).   General: No apparent distress alert and oriented x3 mood and affect normal, dressed appropriately.  HEENT: Pupils equal, extraocular movements intact  Respiratory: Patient's speak in full sentences and does not appear short of breath  Cardiovascular: No lower extremity edema, non tender, no erythema  Neck exam does have some limited sidebending bilaterally.  Tender to palpation previously.  Patient does stand a little more straight when he does get up from a seated position.  Deferred back exam with patient's recent surgery still within 6 weeks  Osteopathic findings  C2 flexed rotated and side bent right C6 flexed rotated and side bent left T3 extended rotated and side bent right inhaled rib T9 extended rotated and side bent left  Assessment and Plan:  Degenerative disc disease, cervical Remains arthritis.  Discussed posture and orthotics.  Discussed which activities to do and which ones to avoid.  Increase activity slowly otherwise.  I do believe that this could be aggravated with patient not being as active initially after the hip replacement but now doing much better.  Follow-up again 6 to 8 weeks    Nonallopathic problems  Decision today  to treat with OMT was based on Physical Exam  After verbal consent patient was treated with HVLA, ME, FPR techniques in cervical, rib, thoracic,areas  Patient tolerated the procedure well with improvement in symptoms  Patient given exercises, stretches and lifestyle modifications  See medications in patient instructions if given  Patient will follow up in 4-8 weeks     The above documentation has been reviewed and is accurate and complete [name], DO         Note: This dictation was prepared with Dragon dictation along with smaller phrase technology. Any transcriptional errors that result from this process are unintentional.

## 2022-05-15 ENCOUNTER — Encounter: Payer: Self-pay | Admitting: Family Medicine

## 2022-05-15 DIAGNOSIS — M5030 Other cervical disc degeneration, unspecified cervical region: Secondary | ICD-10-CM | POA: Insufficient documentation

## 2022-05-15 NOTE — Assessment & Plan Note (Signed)
Remains arthritis.  Discussed posture and orthotics.  Discussed which activities to do and which ones to avoid.  Increase activity slowly otherwise.  I do believe that this could be aggravated with patient not being as active initially after the hip replacement but now doing much better.  Follow-up again 6 to 8 weeks

## 2022-05-17 ENCOUNTER — Ambulatory Visit: Payer: Medicare Other | Admitting: Family Medicine

## 2022-06-07 ENCOUNTER — Encounter (HOSPITAL_COMMUNITY): Payer: Self-pay

## 2022-06-20 NOTE — Progress Notes (Unsigned)
[place] Phone: [phone] Subjective:   Edwin [name], am serving as a scribe for Dr. Hulan Saas.  I'm seeing this patient by the request  of:  Edwin Body, MD  CC: Neck and back pain follow-up  [id number]  Edwin [name] is a 81 y.o. male coming in with complaint of back and neck pain. OMT 05/14/2022. Patient states that he is doing PT once a week and remains sore. Also having pain in lower back due to change in gait.   Medications patient has been prescribed: None           Reviewed prior external information including notes and imaging from previsou exam, outside providers and external EMR if available.   As well as notes that were available from care everywhere and other healthcare systems.  Past medical history, social, surgical and family history all reviewed in electronic medical record.  [name] pertanent information unless stated regarding to the chief complaint.   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/2017   left lat neck, excision RESIDUAL BASAL CELL CARCINOMA, MARGINS FREE   BCC (basal cell carcinoma of skin) 03/13/2017   left lateral neck  BASAL CELL CARCINOMA, NODULAR PATTERN   BCC (basal cell carcinoma of skin) 12/25/2012   left pretibial   BCC (basal cell carcinoma of skin) 05/21/2007   left medial calf - , 0.8 X 0.5CM: SUPERFICIAL BASAL CELL CARCINOMA, BASE INVOLVED   BPH (benign prostatic hyperplasia)    Cancer (HCC)    Prostate Cancer   Dysrhythmia    SVT   Hyperlipidemia    Hypertension    PTSD (post-traumatic stress disorder)    Pulmonary nodules    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   right inferior cheek mandible SQUAMOUS CELL CARCINOMA IN SITU    Allergies  Allergen  Reactions   Amoxicillin-Pot Clavulanate Diarrhea   Bacitracin-Neomycin-Polymyxin Hives   Hydrochlorothiazide    Lipitor [Atorvastatin]     Muscle soreness   Lisinopril Cough   Bacitracin-Polymyxin B Rash   Neosporin [Neomycin-Bacitracin Zn-Polymyx] Rash     Review of Systems:  [name] headache, visual changes, nausea, vomiting, diarrhea, constipation, dizziness, abdominal pain, skin rash, fevers, chills, night sweats, weight loss, swollen lymph nodes, [name] aches, joint swelling, chest pain, shortness of breath, mood changes. POSITIVE muscle aches  Objective  Blood pressure 118/82, pulse 80, height '6\' 1"'$  (1.854 m), SpO2 97 %.   General: [name] apparent distress alert and oriented x3 mood and affect normal, dressed appropriately.  HEENT: Pupils equal, extraocular movements intact  Respiratory: Patient's speak in full sentences and does not appear short of breath  Cardiovascular: [name] lower extremity edema, non tender, [name] erythema  Neck exam does have some loss of lordosis.  Patient does have some limited range of motion noted.  Low back does have severe tightness noted.  Only 5 degrees of extension of the back.  Patient still has some weakness of the hip abductors noted.  Osteopathic findings  C2 flexed rotated and side bent right L2 flexed rotated and side bent right L4 flexed rotated and side bent left Sacrum right on right       Assessment and Plan:  Lumbar radiculopathy Known degenerative disc disease.  Discussed with patient to continue  to work on the hip abductor strengthening and stretching of the hip flexors.  Patient is significant other today.  Discussed with patient about icing regimen and home exercises.  Continue to work on core strengthening.  Follow-up again in 6 weeks    Nonallopathic problems  Decision today to treat with OMT was based on Physical Exam  After verbal consent patient was treated with HVLA, ME, FPR techniques in cervical,  lumbar, and sacral   areas  Patient tolerated the procedure well with improvement in symptoms  Patient given exercises, stretches and lifestyle modifications  See medications in patient instructions if given  Patient will follow up in 4-8 weeks     The above documentation has been reviewed and is accurate and complete Edwin Pulley, DO         Note: This dictation was prepared with Dragon dictation along with smaller phrase technology. Any transcriptional errors that result from this process are unintentional.

## 2022-06-21 ENCOUNTER — Ambulatory Visit: Payer: Medicare Other | Admitting: Family Medicine

## 2022-06-21 ENCOUNTER — Encounter: Payer: Self-pay | Admitting: Family Medicine

## 2022-06-21 VITALS — BP 118/82 | HR 80 | Ht 73.0 in

## 2022-06-21 DIAGNOSIS — M9901 Segmental and somatic dysfunction of cervical region: Secondary | ICD-10-CM | POA: Diagnosis not present

## 2022-06-21 DIAGNOSIS — M9904 Segmental and somatic dysfunction of sacral region: Secondary | ICD-10-CM | POA: Diagnosis not present

## 2022-06-21 DIAGNOSIS — M5416 Radiculopathy, lumbar region: Secondary | ICD-10-CM

## 2022-06-21 DIAGNOSIS — M9903 Segmental and somatic dysfunction of lumbar region: Secondary | ICD-10-CM

## 2022-06-21 NOTE — Assessment & Plan Note (Signed)
Known degenerative disc disease.  Discussed with patient to continue to work on the hip abductor strengthening and stretching of the hip flexors.  Patient is significant other today.  Discussed with patient about icing regimen and home exercises.  Continue to work on core strengthening.  Follow-up again in 6 weeks

## 2022-06-21 NOTE — Patient Instructions (Addendum)
[place] See me in 6 weeks

## 2022-07-24 NOTE — Progress Notes (Unsigned)
[place] Phone: [phone] Subjective:   [name], am serving as a scribe for Dr. Hulan Saas.  I'm seeing this patient by the request  of:  [name], MD  CC: Back and neck pain follow-up  [id number]  Edwin [name] is a 82 y.o. male coming in with complaint of back and neck pain. OMT 06/21/2022. Patient states that he has been doing well since last visit. Still healing form hip replacement   Medications patient has been prescribed: None         Reviewed prior external information including notes and imaging from previsou exam, outside providers and external EMR if available.   As well as notes that were available from care everywhere and other healthcare systems.  Past medical history, social, surgical and family history all reviewed in electronic medical record.  No pertanent information unless stated regarding to the chief complaint.   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/2017   left lat neck, excision RESIDUAL BASAL CELL CARCINOMA, MARGINS FREE   BCC (basal cell carcinoma of skin) 03/13/2017   left lateral neck  BASAL CELL CARCINOMA, NODULAR PATTERN   BCC (basal cell carcinoma of skin) 12/25/2012   left pretibial   BCC (basal cell carcinoma of skin) 05/21/2007   left medial calf - , 0.8 X 0.5CM: SUPERFICIAL BASAL CELL CARCINOMA, BASE INVOLVED   BPH (benign prostatic hyperplasia)    Cancer (HCC)    Prostate Cancer   Dysrhythmia    SVT   Hyperlipidemia    Hypertension    PTSD (post-traumatic stress disorder)    Pulmonary nodules    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   right inferior cheek mandible SQUAMOUS CELL CARCINOMA IN SITU    Allergies  Allergen Reactions    Amoxicillin-Pot Clavulanate Diarrhea   Bacitracin-Neomycin-Polymyxin Hives   Hydrochlorothiazide    Lipitor [Atorvastatin]     Muscle soreness   Lisinopril Cough   Bacitracin-Polymyxin B Rash   Neosporin [Neomycin-Bacitracin Zn-Polymyx] Rash     Review of Systems:  No headache, visual changes, nausea, vomiting, diarrhea, constipation, dizziness, abdominal pain, skin rash, fevers, chills, night sweats, weight loss, swollen lymph nodes, body aches, joint swelling, chest pain, shortness of breath, mood changes. POSITIVE muscle aches  Objective  Blood pressure 132/82, pulse 62, height '6\' 1"'$  (1.854 m), SpO2 98 %.   General: No apparent distress alert and oriented x3 mood and affect normal, dressed appropriately.  HEENT: Pupils equal, extraocular movements intact  Respiratory: Patient's speak in full sentences and does not appear short of breath  Cardiovascular: No lower extremity edema, non tender, no erythema  Back exam does have some loss of lordosis.  Still has tightness noted in the paraspinal musculature.  Limited range of motion in all planes  Osteopathic findings  C2 flexed rotated and side bent right C6 flexed rotated and side bent left T3 extended rotated and side bent right inhaled rib T8 extended rotated and side bent left L2 flexed rotated and side bent right Sacrum right on right        Assessment and Plan:  Lumbar radiculopathy Known spinal stenosis.  Has responded extremely well to the osteopathic manipulation.  Discussed posture and ergonomics and core strengthening.  Patient is still  healing from his hip replacement but is doing well.  Will follow-up with me again in 6 weeks    Nonallopathic problems  Decision today to treat with OMT was based on Physical Exam  After verbal consent patient was treated with HVLA, ME, FPR techniques in cervical, rib, thoracic, lumbar, and sacral  areas  Patient tolerated the procedure well with improvement in  symptoms  Patient given exercises, stretches and lifestyle modifications  See medications in patient instructions if given  Patient will follow up in 4-8 weeks    The above documentation has been reviewed and is accurate and complete Edwin Pulley, DO          Note: This dictation was prepared with Dragon dictation along with smaller phrase technology. Any transcriptional errors that result from this process are unintentional.

## 2022-07-26 ENCOUNTER — Ambulatory Visit: Payer: Medicare Other | Admitting: Family Medicine

## 2022-07-26 VITALS — BP 132/82 | HR 62 | Ht 73.0 in

## 2022-07-26 DIAGNOSIS — M9901 Segmental and somatic dysfunction of cervical region: Secondary | ICD-10-CM

## 2022-07-26 DIAGNOSIS — M5416 Radiculopathy, lumbar region: Secondary | ICD-10-CM | POA: Diagnosis not present

## 2022-07-26 DIAGNOSIS — M9904 Segmental and somatic dysfunction of sacral region: Secondary | ICD-10-CM

## 2022-07-26 DIAGNOSIS — M9903 Segmental and somatic dysfunction of lumbar region: Secondary | ICD-10-CM | POA: Diagnosis not present

## 2022-07-26 DIAGNOSIS — M9902 Segmental and somatic dysfunction of thoracic region: Secondary | ICD-10-CM

## 2022-07-26 NOTE — Assessment & Plan Note (Signed)
Known spinal stenosis.  Has responded extremely well to the osteopathic manipulation.  Discussed posture and ergonomics and core strengthening.  Patient is still healing from his hip replacement but is doing well.  Will follow-up with me again in 6 weeks

## 2022-07-26 NOTE — Patient Instructions (Signed)
See me again in 6 weeks 

## 2022-08-28 IMAGING — DX DG LUMBAR SPINE 2-3V
3 series · 3 of 3 positions shown · non-contrast
Comparison: None.

CLINICAL DATA: Lumbar spine pain.

EXAM:
LUMBAR SPINE - 2-3 VIEW

[l-spine ap]
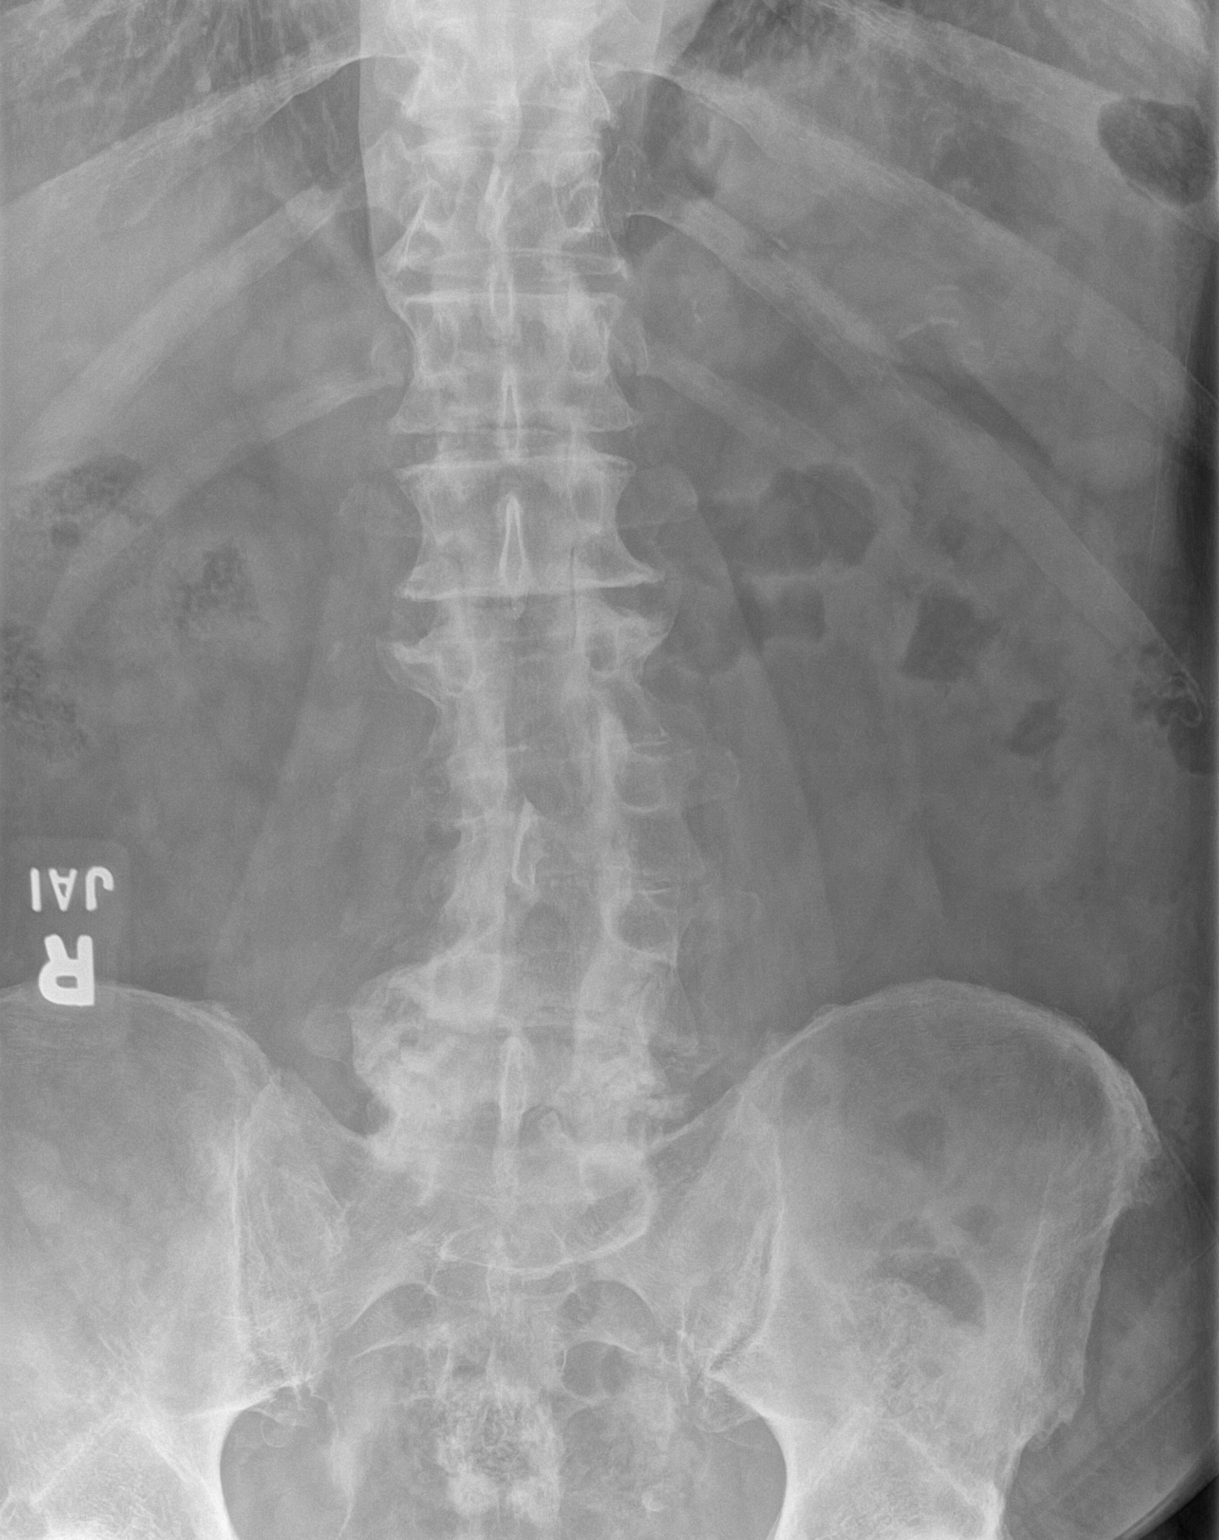

[l-spine lateral (1 of 2)]
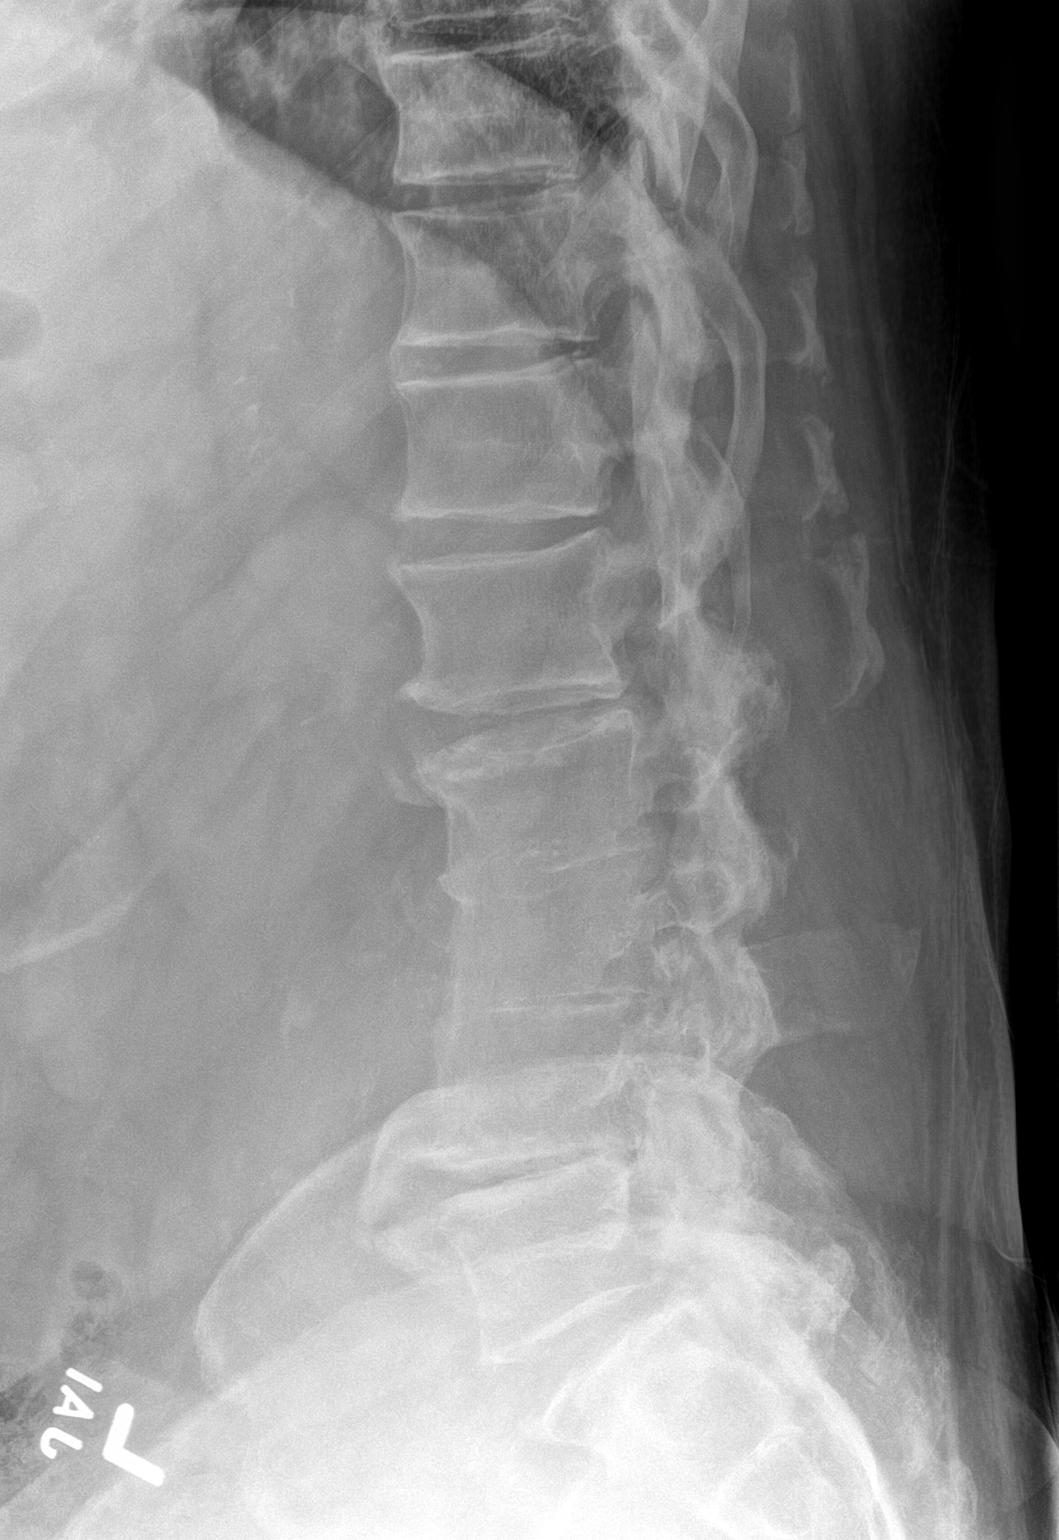

[l-spine lateral (2 of 2)]
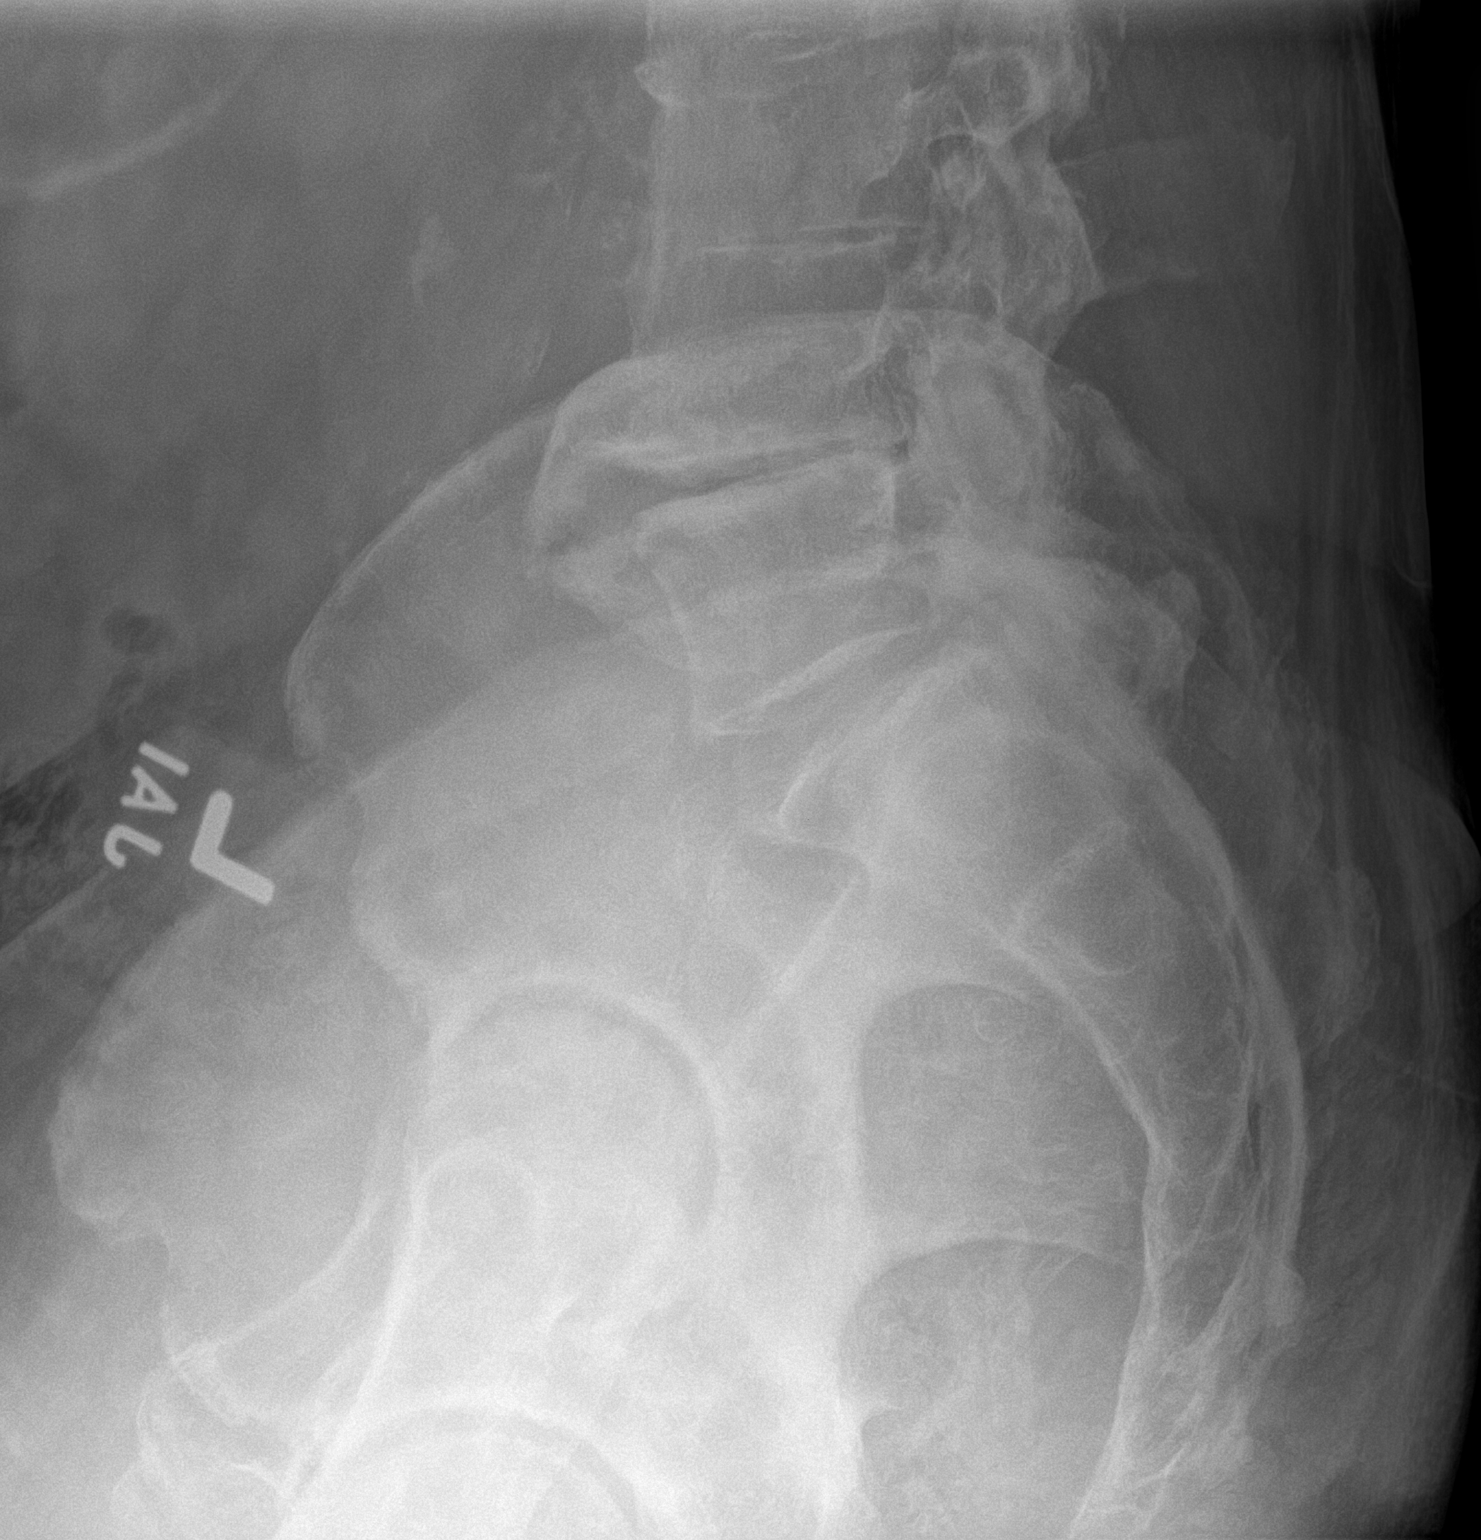

[3 of 3 positions shown; findings below may reference images not displayed]

FINDINGS: Scoliotic curvature lumbar spine, apex to the left. Reversal of
normal lordosis from L2 through L4. Multilevel degenerative disc
disease with anterior osteophytes. Lower lumbar facet degenerative
changes.
IMPRESSION: Degenerative changes as above. Reversal of normal lordosis and
scoliotic curvature lumbar spine as above.

## 2022-08-29 NOTE — Progress Notes (Unsigned)
[place] Phone: [phone] Subjective:   [name], am serving as a scribe for Dr. Hulan Saas.  I'm seeing this patient by the request  of:  [name], MD  CC: Low back pain  [id number]  Edwin [name] is a 82 y.o. male coming in with complaint of back and neck pain. OMT on 07/26/2022. Patient states that he is sore throughout his entire body.  Patient states that he has been trying to work out but is finding it difficult.  Still trying to increase activity were approved possible.  Working with physical therapy  Medications patient has been prescribed:   Taking:         Reviewed prior external information including notes and imaging from previsou exam, outside providers and external EMR if available.   As well as notes that were available from care everywhere and other healthcare systems.  Past medical history, social, surgical and family history all reviewed in electronic medical record.  No pertanent information unless stated regarding to the chief complaint.   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/2017   left lat neck, excision RESIDUAL BASAL CELL CARCINOMA, MARGINS FREE   BCC (basal cell carcinoma of skin) 03/13/2017   left lateral neck  BASAL CELL CARCINOMA, NODULAR PATTERN   BCC (basal cell carcinoma of skin) 12/25/2012   left pretibial   BCC (basal cell carcinoma of skin) 05/21/2007   left medial calf - , 0.8 X 0.5CM: SUPERFICIAL BASAL CELL CARCINOMA, BASE INVOLVED   BPH (benign prostatic hyperplasia)    Cancer (HCC)    Prostate Cancer   Dysrhythmia    SVT   Hyperlipidemia    Hypertension    PTSD (post-traumatic stress disorder)    Pulmonary nodules    Squamous cell carcinoma in situ  03/13/2017   right inferior cheek mandible SQUAMOUS CELL CARCINOMA IN SITU    Allergies  Allergen Reactions   Amoxicillin-Pot Clavulanate Diarrhea   Bacitracin-Neomycin-Polymyxin Hives   Hydrochlorothiazide    Lipitor [Atorvastatin]     Muscle soreness   Lisinopril Cough   Bacitracin-Polymyxin B Rash   Neosporin [Neomycin-Bacitracin Zn-Polymyx] Rash     Review of Systems:  No headache, visual changes, nausea, vomiting, diarrhea, constipation, dizziness, abdominal pain, skin rash, fevers, chills, night sweats, weight loss, swollen lymph nodes, body aches, joint swelling, chest pain, shortness of breath, mood changes. POSITIVE muscle aches  Objective  Blood pressure 138/82, pulse 64, SpO2 95 %.   General: No apparent distress alert and oriented x3 mood and affect normal, dressed appropriately.  HEENT: Pupils equal, extraocular movements intact  Respiratory: Patient's speak in full sentences and does not appear short of breath  Cardiovascular: No lower extremity edema, non tender, no erythema  Low back pain does have some loss of lordosis.  Some tenderness to palpation noted.  Tightness with FABER test noted.  Limited extension of the back noted.  Osteopathic findings  C4 flexed rotated and side bent right T4 extended rotated and side bent right inhaled rib L2 flexed rotated and side bent right L4 flexed rotated and side bent left Sacrum right on right       Assessment and Plan:  Lumbar radiculopathy Significant degenerative disc disease of the lumbar spine.  Starting  to try to become worse active at the moment.  Discussed icing regimen and home exercises, which activities to do and which ones to avoid.  We discussed the importance of stretching the hip flexors.  We discussed more machine weights and free weights.  Follow-up again in 6 to 8 weeks    Nonallopathic problems  Decision today to treat with OMT was based on Physical Exam  After verbal consent patient was  treated with HVLA, ME, FPR techniques in cervical, rib, thoracic, lumbar, and sacral  areas  Patient tolerated the procedure well with improvement in symptoms  Patient given exercises, stretches and lifestyle modifications  See medications in patient instructions if given  Patient will follow up in 4-8 weeks     The above documentation has been reviewed and is accurate and complete [name], DO         Note: This dictation was prepared with Dragon dictation along with smaller phrase technology. Any transcriptional errors that result from this process are unintentional.

## 2022-08-30 ENCOUNTER — Ambulatory Visit: Payer: Medicare Other | Admitting: Family Medicine

## 2022-08-30 VITALS — BP 138/82 | HR 64

## 2022-08-30 DIAGNOSIS — M5416 Radiculopathy, lumbar region: Secondary | ICD-10-CM | POA: Diagnosis not present

## 2022-08-30 DIAGNOSIS — M9908 Segmental and somatic dysfunction of rib cage: Secondary | ICD-10-CM | POA: Diagnosis not present

## 2022-08-30 DIAGNOSIS — M9904 Segmental and somatic dysfunction of sacral region: Secondary | ICD-10-CM

## 2022-08-30 DIAGNOSIS — M9903 Segmental and somatic dysfunction of lumbar region: Secondary | ICD-10-CM | POA: Diagnosis not present

## 2022-08-30 DIAGNOSIS — M9901 Segmental and somatic dysfunction of cervical region: Secondary | ICD-10-CM | POA: Diagnosis not present

## 2022-08-30 DIAGNOSIS — M9902 Segmental and somatic dysfunction of thoracic region: Secondary | ICD-10-CM

## 2022-08-30 NOTE — Patient Instructions (Signed)
Good to see you See me in 5-6 weeks

## 2022-08-30 NOTE — Assessment & Plan Note (Signed)
Significant degenerative disc disease of the lumbar spine.  Starting to try to become worse active at the moment.  Discussed icing regimen and home exercises, which activities to do and which ones to avoid.  We discussed the importance of stretching the hip flexors.  We discussed more machine weights and free weights.  Follow-up again in 6 to 8 weeks

## 2022-09-12 ENCOUNTER — Encounter: Payer: Self-pay | Admitting: Family Medicine

## 2022-09-13 MED ORDER — AMOXICILLIN 500 MG PO TABS: ORAL_TABLET | ORAL | 0 refills | Status: DC

## 2022-10-03 NOTE — Progress Notes (Signed)
[place] Phone: [phone] Subjective:   Edwin [name], am serving as a scribe for Dr. Hulan Saas.  I'm seeing this patient by the request  of:  [name], MD  CC: back and neck apin   [id number]  Edwin [name] is a 82 y.o. male coming in with complaint of back and neck pain. OMT on 08/30/2022. Patient states that he is back in the gym and feeling good.   Medications patient has been prescribed: amoxicillin  Taking:         Reviewed prior external information including notes and imaging from previsou exam, outside providers and external EMR if available.   As well as notes that were available from care everywhere and other healthcare systems.  Past medical history, social, surgical and family history all reviewed in electronic medical record.  [name] pertanent information unless stated regarding to the chief complaint.   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/2017   left lat neck, excision RESIDUAL BASAL CELL CARCINOMA, MARGINS FREE   BCC (basal cell carcinoma of skin) 03/13/2017   left lateral neck  BASAL CELL CARCINOMA, NODULAR PATTERN   BCC (basal cell carcinoma of skin) 12/25/2012   left pretibial   BCC (basal cell carcinoma of skin) 05/21/2007   left medial calf - , 0.8 X 0.5CM: SUPERFICIAL BASAL CELL CARCINOMA, BASE INVOLVED   BPH (benign prostatic hyperplasia)    Cancer (HCC)    Prostate Cancer   Dysrhythmia    SVT   Hyperlipidemia    Hypertension    PTSD (post-traumatic stress disorder)    Pulmonary nodules    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   right inferior cheek mandible SQUAMOUS CELL CARCINOMA IN SITU    Allergies  Allergen Reactions   Amoxicillin-Pot Clavulanate Diarrhea    Bacitracin-Neomycin-Polymyxin Hives   Hydrochlorothiazide    Lipitor [Atorvastatin]     Muscle soreness   Lisinopril Cough   Bacitracin-Polymyxin B Rash   Neosporin [Neomycin-Bacitracin Zn-Polymyx] Rash     Review of Systems:  [name] headache, visual changes, nausea, vomiting, diarrhea, constipation, dizziness, abdominal pain, skin rash, fevers, chills, night sweats, weight loss, swollen lymph nodes, body aches, joint swelling, chest pain, shortness of breath, mood changes. POSITIVE muscle aches  Objective  Blood pressure 130/82, pulse 65, height 6\' 1"  (1.854 m).   General: [name] apparent distress alert and oriented x3 mood and affect normal, dressed appropriately.  HEENT: Pupils equal, extraocular movements intact  Respiratory: Patient's speak in full sentences and does not appear short of breath  Cardiovascular: [name] lower extremity edema, non tender, [name] erythema  Low back still has significant tightness noted.  Patient's gait is significantly improved since home exercises and icing regimen.  Discussed with patient about which activities to do and which ones to avoid.  Increase activity slowly otherwise.  Follow-up again in 6 to 8 weeks  Osteopathic findings  C2 flexed rotated and side bent right T3 extended rotated and side bent right inhaled rib T9 extended rotated and side bent left L2 flexed rotated and side bent right L4 flexed rotated and side bent left Sacrum right on right    Assessment and Plan:  Lumbar radiculopathy Doing well at this moment.  Patient would like labs rechecked  again just to see if anything else is contributing to some increasing in mild tightness that he has noticed.  Discussed with patient about icing regimen and home exercises, discussed avoiding certain activities.  Increase activity that I think will be more beneficial.  Follow-up again in 6 to 8 weeks    Nonallopathic problems  Decision today to treat with OMT was based on Physical Exam  After verbal  consent patient was treated with HVLA, ME, FPR techniques in cervical, rib, thoracic, lumbar, and sacral  areas  Patient tolerated the procedure well with improvement in symptoms  Patient given exercises, stretches and lifestyle modifications  See medications in patient instructions if given  Patient will follow up in 4-8 weeks    The above documentation has been reviewed and is accurate and complete [name], DO          Note: This dictation was prepared with Dragon dictation along with smaller phrase technology. Any transcriptional errors that result from this process are unintentional.

## 2022-10-04 ENCOUNTER — Ambulatory Visit: Payer: Medicare Other | Admitting: Family Medicine

## 2022-10-04 VITALS — BP 130/82 | HR 65 | Ht 73.0 in

## 2022-10-04 DIAGNOSIS — M5416 Radiculopathy, lumbar region: Secondary | ICD-10-CM | POA: Diagnosis not present

## 2022-10-04 DIAGNOSIS — M9903 Segmental and somatic dysfunction of lumbar region: Secondary | ICD-10-CM

## 2022-10-04 DIAGNOSIS — M9904 Segmental and somatic dysfunction of sacral region: Secondary | ICD-10-CM | POA: Diagnosis not present

## 2022-10-04 DIAGNOSIS — M9902 Segmental and somatic dysfunction of thoracic region: Secondary | ICD-10-CM

## 2022-10-04 DIAGNOSIS — M2550 Pain in unspecified joint: Secondary | ICD-10-CM | POA: Diagnosis not present

## 2022-10-04 DIAGNOSIS — M9908 Segmental and somatic dysfunction of rib cage: Secondary | ICD-10-CM

## 2022-10-04 DIAGNOSIS — M9901 Segmental and somatic dysfunction of cervical region: Secondary | ICD-10-CM | POA: Diagnosis not present

## 2022-10-04 LAB — COMPREHENSIVE METABOLIC PANEL
ALT: 18 U/L (ref 0–53)
AST: 22 U/L (ref 0–37)
Albumin: 3.9 g/dL (ref 3.5–5.2)
Alkaline Phosphatase: 64 U/L (ref 39–117)
BUN: 20 mg/dL (ref 6–23)
CO2: 28 mEq/L (ref 19–32)
Calcium: 9 mg/dL (ref 8.4–10.5)
Chloride: 102 mEq/L (ref 96–112)
Creatinine, Ser: 0.95 mg/dL (ref 0.40–1.50)
GFR: 76.1 mL/min (ref >60.00)
Glucose, Bld: 111 mg/dL — ABNORMAL HIGH (ref 70–99)
Potassium: 3.6 mEq/L (ref 3.5–5.1)
Sodium: 137 mEq/L (ref 135–145)
Total Bilirubin: 0.7 mg/dL (ref 0.2–1.2)
Total Protein: 6.9 g/dL (ref 6.0–8.3)

## 2022-10-04 LAB — CBC WITH DIFFERENTIAL/PLATELET
Basophils Absolute: 0 10*3/uL (ref 0.0–0.1)
Basophils Relative: 0.4 % (ref 0.0–3.0)
Eosinophils Absolute: 0.3 10*3/uL (ref 0.0–0.7)
Eosinophils Relative: 4.6 % (ref 0.0–5.0)
HCT: 41.3 % (ref 39.0–52.0)
Hemoglobin: 14.2 g/dL (ref 13.0–17.0)
Lymphocytes Relative: 17.8 % (ref 12.0–46.0)
Lymphs Abs: 1.3 10*3/uL (ref 0.7–4.0)
MCHC: 34.3 g/dL (ref 30.0–36.0)
MCV: 91.1 fl (ref 78.0–100.0)
Monocytes Absolute: 0.6 10*3/uL (ref 0.1–1.0)
Monocytes Relative: 8.1 % (ref 3.0–12.0)
Neutro Abs: 5 10*3/uL (ref 1.4–7.7)
Neutrophils Relative %: 69.1 % (ref 43.0–77.0)
Platelets: 203 10*3/uL (ref 150.0–400.0)
RBC: 4.54 Mil/uL (ref 4.22–5.81)
RDW: 13.8 % (ref 11.5–15.5)
WBC: 7.2 10*3/uL (ref 4.0–10.5)

## 2022-10-04 LAB — IBC PANEL
Iron: 56 ug/dL (ref 42–165)
Saturation Ratios: 15.9 % — ABNORMAL LOW (ref 20.0–50.0)
TIBC: 351.4 ug/dL (ref 250.0–450.0)
Transferrin: 251 mg/dL (ref 212.0–360.0)

## 2022-10-04 LAB — URIC ACID: Uric Acid, Serum: 7.4 mg/dL (ref 4.0–7.8)

## 2022-10-04 LAB — VITAMIN D 25 HYDROXY (VIT D DEFICIENCY, FRACTURES): VITD: 61.25 ng/mL (ref 30.00–100.00)

## 2022-10-04 LAB — SEDIMENTATION RATE: Sed Rate: 19 mm/hr (ref 0–20)

## 2022-10-04 LAB — FERRITIN: Ferritin: 95.4 ng/mL (ref 22.0–322.0)

## 2022-10-04 LAB — TSH: TSH: 2.68 u[IU]/mL (ref 0.35–5.50)

## 2022-10-04 LAB — VITAMIN B12: Vitamin B-12: >1500 pg/mL — ABNORMAL HIGH (ref 211–911)

## 2022-10-04 NOTE — Patient Instructions (Addendum)
Lab today See you again in 7-8 weeks

## 2022-10-05 ENCOUNTER — Encounter: Payer: Self-pay | Admitting: Family Medicine

## 2022-10-05 NOTE — Assessment & Plan Note (Signed)
Doing well at this moment.  Patient would like labs rechecked again just to see if anything else is contributing to some increasing in mild tightness that he has noticed.  Discussed with patient about icing regimen and home exercises, discussed avoiding certain activities.  Increase activity that I think will be more beneficial.  Follow-up again in 6 to 8 weeks

## 2022-10-06 LAB — PTH, INTACT AND CALCIUM
Calcium: 8.7 mg/dL (ref 8.6–10.3)
PTH: 32 pg/mL (ref 16–77)

## 2022-11-15 NOTE — Progress Notes (Signed)
[name] [address] [place] [address] Phone: [phone] Subjective:   Edwin [name], am serving as a scribe for Dr. Antoine Primas.  I'm seeing this patient by the request  of:  Marisue Ivan, MD  CC: neck and back pain follow up   BJY:NWGNFAOZHY  Edwin [name] is a 82 y.o. male coming in with complaint of back and neck pain. OMT on 10/04/2022. Patient states that he is doing well.  Has had some tightness but nothing that stopping him from activity.  Medications patient has been prescribed:   Taking:         Reviewed prior external information including notes and imaging from previsou exam, outside providers and external EMR if available.   As well as notes that were available from care everywhere and other healthcare systems.  Past medical history, social, surgical and family history all reviewed in electronic medical record.  No pertanent information unless stated regarding to the chief complaint.   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/2017   left lat neck, excision RESIDUAL BASAL CELL CARCINOMA, MARGINS FREE   BCC (basal cell carcinoma of skin) 03/13/2017   left lateral neck  BASAL CELL CARCINOMA, NODULAR PATTERN   BCC (basal cell carcinoma of skin) 12/25/2012   left pretibial   BCC (basal cell carcinoma of skin) 05/21/2007   left medial calf - , 0.8 X 0.5CM: SUPERFICIAL BASAL CELL CARCINOMA, BASE INVOLVED   BPH (benign prostatic hyperplasia)    Cancer (HCC)    Prostate Cancer   Dysrhythmia    SVT   Hyperlipidemia    Hypertension    PTSD (post-traumatic stress disorder)    Pulmonary nodules    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   right inferior cheek mandible SQUAMOUS CELL CARCINOMA IN SITU    Allergies  Allergen  Reactions   Amoxicillin-Pot Clavulanate Diarrhea   Bacitracin-Neomycin-Polymyxin Hives   Hydrochlorothiazide    Lipitor [Atorvastatin]     Muscle soreness   Lisinopril Cough   Bacitracin-Polymyxin B Rash   Neosporin [Neomycin-Bacitracin Zn-Polymyx] Rash     Review of Systems:  No headache, visual changes, nausea, vomiting, diarrhea, constipation, dizziness, abdominal pain, skin rash, fevers, chills, night sweats, weight loss, swollen lymph nodes, body aches, joint swelling, chest pain, shortness of breath, mood changes. POSITIVE muscle aches  Objective  Blood pressure 132/82, height 6\' 1"  (1.854 m), SpO2 97 %.   General: No apparent distress alert and oriented x3 mood and affect normal, dressed appropriately.  HEENT: Pupils equal, extraocular movements intact  Respiratory: Patient's speak in full sentences and does not appear short of breath  Cardiovascular: No lower extremity edema, non tender, no erythema  Low back exam does have some loss of lordosis. Tightness with FABER test bilaterally COVID-19 positive test (U07.1, COVID-19) with Acute Respiratory Distress Syndrome (ARDS) (J80, ARDS) (If respiratory failure or sepsis present, add as separate assessment)    Osteopathic findings  C2 flexed rotated and side bent right C6 flexed rotated and side bent left T5 extended rotated and side bent right inhaled rib T9 extended rotated and side bent left L2 flexed rotated and side bent right Sacrum right on right       Assessment and Plan:  Lumbar radiculopathy Discussed with patient about icing regimen  and home exercises.  Discussed with patient to continue to work on core strengthening.  Which is seems to be more tightness.  Working out on a regular basis at the moment.  Do not think that patient needs any other significant changes at the moment.  Follow-up again in 6 to 8 weeks    Nonallopathic problems  Decision today to treat with OMT was based on Physical  Exam  After verbal consent patient was treated with HVLA, ME, FPR techniques in cervical, rib, thoracic, lumbar, and sacral  areas  Patient tolerated the procedure well with improvement in symptoms  Patient given exercises, stretches and lifestyle modifications  See medications in patient instructions if given  Patient will follow up in 4-8 weeks     The above documentation has been reviewed and is accurate and complete [name], DO         Note: This dictation was prepared with Dragon dictation along with smaller phrase technology. Any transcriptional errors that result from this process are unintentional.

## 2022-11-20 ENCOUNTER — Ambulatory Visit: Payer: Medicare Other | Admitting: Family Medicine

## 2022-11-20 ENCOUNTER — Encounter: Payer: Self-pay | Admitting: Family Medicine

## 2022-11-20 VITALS — BP 132/82 | Ht 73.0 in

## 2022-11-20 DIAGNOSIS — M9903 Segmental and somatic dysfunction of lumbar region: Secondary | ICD-10-CM | POA: Diagnosis not present

## 2022-11-20 DIAGNOSIS — M9908 Segmental and somatic dysfunction of rib cage: Secondary | ICD-10-CM

## 2022-11-20 DIAGNOSIS — M9902 Segmental and somatic dysfunction of thoracic region: Secondary | ICD-10-CM

## 2022-11-20 DIAGNOSIS — M5416 Radiculopathy, lumbar region: Secondary | ICD-10-CM

## 2022-11-20 DIAGNOSIS — M9901 Segmental and somatic dysfunction of cervical region: Secondary | ICD-10-CM

## 2022-11-20 DIAGNOSIS — M9904 Segmental and somatic dysfunction of sacral region: Secondary | ICD-10-CM | POA: Diagnosis not present

## 2022-11-20 NOTE — Assessment & Plan Note (Signed)
Discussed with patient about icing regimen and home exercises.  Discussed with patient to continue to work on core strengthening.  Which is seems to be more tightness.  Working out on a regular basis at the moment.  Do not think that patient needs any other significant changes at the moment.  Follow-up again in 6 to 8 weeks

## 2022-11-20 NOTE — Patient Instructions (Signed)
Good to see you See me again in 6 weeks 

## 2022-12-25 NOTE — Progress Notes (Unsigned)
[name] [address] [place] [address] Phone: [phone] Subjective:   [name], am serving as a scribe for Dr. Antoine Primas.  I'm seeing this patient by the request  of:  Marisue Ivan, MD  CC: Low back pain follow-up  BJY:NWGNFAOZHY  Edwin [name] is a 82 y.o. male coming in with complaint of back and neck pain. OMT on 11/20/2022. Patient states real tight. No new concerns.  Medications patient has been prescribed:   Taking:         Reviewed prior external information including notes and imaging from previsou exam, outside providers and external EMR if available.   As well as notes that were available from care everywhere and other healthcare systems.  Past medical history, social, surgical and family history all reviewed in electronic medical record.  No pertanent information unless stated regarding to the chief complaint.   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/2017   left lat neck, excision RESIDUAL BASAL CELL CARCINOMA, MARGINS FREE   BCC (basal cell carcinoma of skin) 03/13/2017   left lateral neck  BASAL CELL CARCINOMA, NODULAR PATTERN   BCC (basal cell carcinoma of skin) 12/25/2012   left pretibial   BCC (basal cell carcinoma of skin) 05/21/2007   left medial calf - , 0.8 X 0.5CM: SUPERFICIAL BASAL CELL CARCINOMA, BASE INVOLVED   BPH (benign prostatic hyperplasia)    Cancer (HCC)    Prostate Cancer   Dysrhythmia    SVT   Hyperlipidemia    Hypertension    PTSD (post-traumatic stress disorder)    Pulmonary nodules    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 POSITIVE muscle aches  Objective  Blood pressure 132/72, pulse 81, height 6\' 1"  (1.854 m), SpO2 96 %.   General: No apparent distress alert and oriented x3 mood and affect normal, dressed appropriately.  HEENT: Pupils equal, extraocular movements intact  Respiratory: Patient's speak in full sentences and does not appear short of breath  Cardiovascular: No lower extremity edema, non tender, no erythema  No significant loss of lordosis.  Significant tightness of the hip flexors bilaterally.  Tender to palpation from the thoracolumbar all the way down to the sacroiliac joint bilaterally.  Limited rotation as well as sidebending.  Osteopathic findings  C2 flexed rotated and side bent right C7 flexed rotated and side bent right T3 extended rotated and side bent right inhaled rib T8 extended rotated and side bent left L2 flexed rotated and side bent right L4 F RS right  Sacrum right on right     Assessment and Plan:  Degenerative disc disease, cervical Discussed with patient at great length.  Discussed which home exercises which ones to do and which  ones to avoid.  Toradol and Depo-Medrol given again today.  Was having significant tightness and likely secondary to sitting on a more regular basis at the moment.  Will follow-up again in 6 weeks    Nonallopathic problems  Decision today to treat with OMT was based on Physical Exam  After verbal consent patient was treated with HVLA, ME, FPR techniques in cervical,  rib, thoracic, lumbar, and sacral  areas  Patient tolerated the procedure well with improvement in symptoms  Patient given exercises, stretches and lifestyle modifications  See medications in patient instructions if given  Patient will follow up in 4-8 weeks     The above documentation has been reviewed and is accurate and complete [name], DO         Note: This dictation was prepared with Dragon dictation along with smaller phrase technology. Any transcriptional errors that result from this process are unintentional.

## 2022-12-26 ENCOUNTER — Ambulatory Visit: Payer: Medicare Other | Admitting: Family Medicine

## 2022-12-26 VITALS — BP 132/72 | HR 81 | Ht 73.0 in

## 2022-12-26 DIAGNOSIS — M9903 Segmental and somatic dysfunction of lumbar region: Secondary | ICD-10-CM | POA: Diagnosis not present

## 2022-12-26 DIAGNOSIS — M9904 Segmental and somatic dysfunction of sacral region: Secondary | ICD-10-CM

## 2022-12-26 DIAGNOSIS — M5030 Other cervical disc degeneration, unspecified cervical region: Secondary | ICD-10-CM | POA: Diagnosis not present

## 2022-12-26 DIAGNOSIS — M9901 Segmental and somatic dysfunction of cervical region: Secondary | ICD-10-CM | POA: Diagnosis not present

## 2022-12-26 DIAGNOSIS — M9908 Segmental and somatic dysfunction of rib cage: Secondary | ICD-10-CM

## 2022-12-26 DIAGNOSIS — M9902 Segmental and somatic dysfunction of thoracic region: Secondary | ICD-10-CM

## 2022-12-26 MED ORDER — KETOROLAC TROMETHAMINE 30 MG/ML IJ SOLN
30.0000 mg | Freq: Once | INTRAMUSCULAR | Status: AC
  Administered 2022-12-26: 30 mg via INTRAMUSCULAR

## 2022-12-26 MED ORDER — METHYLPREDNISOLONE ACETATE 40 MG/ML IJ SUSP
40.0000 mg | Freq: Once | INTRAMUSCULAR | Status: AC
  Administered 2022-12-26: 40 mg via INTRAMUSCULAR

## 2022-12-26 NOTE — Assessment & Plan Note (Signed)
Discussed with patient at great length.  Discussed which home exercises which ones to do and which ones to avoid.  Toradol and Depo-Medrol given again today.  Was having significant tightness and likely secondary to sitting on a more regular basis at the moment.  Will follow-up again in 6 weeks

## 2022-12-26 NOTE — Patient Instructions (Signed)
See you again in 6-8 weeks 

## 2023-01-08 ENCOUNTER — Ambulatory Visit: Payer: Medicare Other | Admitting: Dermatology

## 2023-01-08 ENCOUNTER — Encounter: Payer: Self-pay | Admitting: Dermatology

## 2023-01-08 VITALS — BP 130/82

## 2023-01-08 DIAGNOSIS — W908XXA Exposure to other nonionizing radiation, initial encounter: Secondary | ICD-10-CM

## 2023-01-08 DIAGNOSIS — X32XXXA Exposure to sunlight, initial encounter: Secondary | ICD-10-CM

## 2023-01-08 DIAGNOSIS — L570 Actinic keratosis: Secondary | ICD-10-CM | POA: Diagnosis not present

## 2023-01-08 DIAGNOSIS — Z79899 Other long term (current) drug therapy: Secondary | ICD-10-CM

## 2023-01-08 DIAGNOSIS — L578 Other skin changes due to chronic exposure to nonionizing radiation: Secondary | ICD-10-CM | POA: Diagnosis not present

## 2023-01-08 DIAGNOSIS — Z5111 Encounter for antineoplastic chemotherapy: Secondary | ICD-10-CM

## 2023-01-08 DIAGNOSIS — Z7189 Other specified counseling: Secondary | ICD-10-CM

## 2023-01-08 MED ORDER — FLUOROURACIL 5 % EX CREA: TOPICAL_CREAM | Freq: Two times a day (BID) | CUTANEOUS | 2 refills | Status: DC

## 2023-01-08 NOTE — Patient Instructions (Addendum)
--In 1 month 02/07/23 Start 5-fluorouracil/calcipotriene cream twice a day for 10 days to affected areas including scalp. Prescription sent to [facility]. Patient advised they will receive an email to purchase the medication online and have it sent to their home. Patient provided with handout reviewing treatment course and side effects and advised to call or message [place] on MyChart with any concerns.   Instructions for [facility]  One or more of your medications was sent to the [facility] mail order [facility] pharmacy. You will receive an email from them and can purchase the medicine through that link. It will then be mailed to your home at the address you confirmed. If for any reason you do not receive an email from them, please check your spam folder. If you still do not find the email, please let us know. [facility] phone number is [phone].   5-Fluorouracil/Calcipotriene Patient Education   Actinic keratoses are the dry, red scaly spots on the skin caused by sun damage. A portion of these spots can turn into skin cancer with time, and treating them can help prevent development of skin cancer.   Treatment of these spots requires removal of the defective skin cells. There are various ways to remove actinic keratoses, including freezing with liquid nitrogen, treatment with creams, or treatment with a blue light procedure in the office.   5-fluorouracil cream is a topical cream used to treat actinic keratoses. It works by interfering with the growth of abnormal fast-growing skin cells, such as actinic keratoses. These cells peel off and are replaced by healthy ones.   5-fluorouracil/calcipotriene is a combination of the 5-fluorouracil cream with a vitamin D analog cream called calcipotriene. The calcipotriene alone does not treat actinic keratoses. However, when it is combined with 5-fluorouracil, it helps the 5-fluorouracil treat the actinic  keratoses much faster so that the same results can be achieved with a much shorter treatment time.  INSTRUCTIONS FOR 5-FLUOROURACIL/CALCIPOTRIENE CREAM:   5-fluorouracil/calcipotriene cream typically only needs to be used for 4-7 days. A thin layer should be applied twice a day to the treatment areas recommended by your physician.   If your physician prescribed you separate tubes of 5-fluourouracil and calcipotriene, apply a thin layer of 5-fluorouracil followed by a thin layer of calcipotriene.   Avoid contact with your eyes, nostrils, and mouth. Do not use 5-fluorouracil/calcipotriene cream on infected or open wounds.   You will develop redness, irritation and some crusting at areas where you have pre-cancer damage/actinic keratoses. IF YOU DEVELOP PAIN, BLEEDING, OR SIGNIFICANT CRUSTING, STOP THE TREATMENT EARLY - you have already gotten a good response and the actinic keratoses should clear up well.  Wash your hands after applying 5-fluorouracil 5% cream on your skin.   A moisturizer or sunscreen with a minimum SPF 30 should be applied each morning.   Once you have finished the treatment, you can apply a thin layer of Vaseline twice a day to irritated areas to soothe and calm the areas more quickly. If you experience significant discomfort, contact your physician.  For some patients it is necessary to repeat the treatment for best results.  SIDE EFFECTS: When using 5-fluorouracil/calcipotriene cream, you may have mild irritation, such as redness, dryness, swelling, or a mild burning sensation. This usually resolves within 2 weeks. The more actinic keratoses you have, the more redness and inflammation you can expect during treatment. Eye irritation has been reported rarely. If this occurs, please let us know.  If you  have any trouble using this cream, please call the office. If you have any other questions about this information, please do not hesitate to ask me before you leave the  office.  Cryotherapy Aftercare  Wash gently with soap and water everyday.   Apply Vaseline and Band-Aid daily until healed.     Due to recent changes in healthcare laws, you may see results of your pathology and/or laboratory studies on MyChart before the doctors have had a chance to review them. We understand that in some cases there may be results that are confusing or concerning to you. Please understand that not all results are received at the same time and often the doctors may need to interpret multiple results in order to provide you with the best plan of care or course of treatment. Therefore, we ask that you please give [place] 2 business days to thoroughly review all your results before contacting the office for clarification. Should we see a critical lab result, you will be contacted sooner.   If You Need Anything After Your Visit  If you have any questions or concerns for your doctor, please call our main line at [phone] and press option 4 to reach your doctor's medical assistant. If no one answers, please leave a voicemail as directed and we will return your call as soon as possible. Messages left after 4 pm will be answered the following business day.   You may also send [place] a message via MyChart. We typically respond to MyChart messages within 1-2 business days.  For prescription refills, please ask your pharmacy to contact our office. Our fax number is [phone].  If you have an urgent issue when the clinic is closed that cannot wait until the next business day, you can page your doctor at the number below.    Please note that while we do our best to be available for urgent issues outside of office hours, we are not available 24/7.   If you have an urgent issue and are unable to reach [place], you may choose to seek medical care at your doctor's office, retail clinic, urgent care center, or emergency room.  If you have a medical emergency, please immediately call 911 or go to  the emergency department.  Pager Numbers  - Dr. Gwen Pounds: [phone]  - Dr. Neale Burly: [phone]  - Dr. Roseanne Reno: [phone]  In the event of inclement weather, please call our main line at [phone] for an update on the status of any delays or closures.  Dermatology Medication Tips: Please keep the boxes that topical medications come in in order to help keep track of the instructions about where and how to use these. Pharmacies typically print the medication instructions only on the boxes and not directly on the medication tubes.   If your medication is too expensive, please contact our office at [phone] option 4 or send [place] a message through MyChart.   We are unable to tell what your co-pay for medications will be in advance as this is different depending on your insurance coverage. However, we may be able to find a substitute medication at lower cost or fill out paperwork to get insurance to cover a needed medication.   If a prior authorization is required to get your medication covered by your insurance company, please allow [place] 1-2 business days to complete this process.  Drug prices often vary depending on where the prescription is filled and some pharmacies may offer cheaper prices.  The website www.goodrx.com contains  coupons for medications through different pharmacies. The prices here do not account for what the cost may be with help from insurance (it may be cheaper with your insurance), but the website can give you the price if you did not use any insurance.  - You can print the associated coupon and take it with your prescription to the pharmacy.  - You may also stop by our office during regular business hours and pick up a GoodRx coupon card.  - If you need your prescription sent electronically to a different pharmacy, notify our office through [facility] or by phone at [phone] option 4.     Si Usted Necesita Algo Despus de Su Visita  Tambin puede  enviarnos un mensaje a travs de Pharmacist, community. Por lo general respondemos a los mensajes de MyChart en el transcurso de 1 a 2 das hbiles.  Para renovar recetas, por favor pida a su farmacia que se ponga en contacto con nuestra oficina. Harland Dingwall de fax es [place] [phone].  Si tiene un asunto urgente cuando la clnica est cerrada y que no puede esperar hasta el siguiente da hbil, puede llamar/localizar a su doctor(a) al nmero que aparece a continuacin.   Por favor, tenga en cuenta que aunque hacemos todo lo posible para estar disponibles para asuntos urgentes fuera del horario de [place], no estamos disponibles las 24 horas del da, los 7 das de la [place].   Si tiene un problema urgente y no puede comunicarse con nosotros, puede optar por buscar atencin mdica  en el consultorio de su doctor(a), en una clnica privada, en un centro de atencin urgente o en una sala de emergencias.  Si tiene Engineering geologist, por favor llame inmediatamente al 911 o vaya a la sala de emergencias.  Nmeros de bper  - Dr. Nehemiah Massed: [phone]  - Dra. Moye: [phone]  - Dra. Nicole Kindred: [phone]  En caso de inclemencias del [place], por favor llame a Johnsie Kindred principal al [phone] para una actualizacin sobre el [place] de cualquier retraso o cierre.  Consejos para la medicacin en dermatologa: Por favor, guarde las cajas en las que vienen los medicamentos de uso tpico para ayudarle a seguir las instrucciones sobre dnde y cmo usarlos. Las farmacias generalmente imprimen las instrucciones del medicamento slo en las cajas y no directamente en los tubos del [place].   Si su medicamento es muy caro, por favor, pngase en contacto con Zigmund Daniel llamando al [phone] y presione la opcin 4 o envenos un mensaje a travs de Pharmacist, community.   No podemos decirle cul ser su copago por los medicamentos por adelantado ya que esto es diferente dependiendo de la cobertura de su seguro.  Sin embargo, es posible que podamos encontrar un medicamento sustituto a Electrical engineer un formulario para que el seguro cubra el medicamento que se considera necesario.   Si se requiere una autorizacin previa para que su compaa de seguros [place] su medicamento, por favor permtanos de 1 a 2 das hbiles para completar este proceso.  Los precios de los medicamentos varan con frecuencia dependiendo del Environmental consultant de dnde se surte la receta y alguna farmacias pueden ofrecer precios ms baratos.  El sitio web www.goodrx.com tiene cupones para medicamentos de Airline pilot. Los precios aqu no tienen en cuenta lo que podra costar con la ayuda del seguro (puede ser ms barato con su seguro), pero el sitio web puede darle el precio si no utiliz Research scientist (physical sciences).  - Puede imprimir el cupn  correspondiente y llevarlo con su receta a la farmacia.  - Tambin puede pasar por nuestra oficina durante el horario de atencin regular y Charity fundraiser una tarjeta de cupones de GoodRx.  - Si necesita que su receta se enve electrnicamente a una farmacia diferente, informe a nuestra oficina a travs de MyChart de [place] o por telfono llamando al [phone] y presione la opcin 4.

## 2023-01-08 NOTE — Progress Notes (Signed)
Follow-Up Visit   Subjective  Edwin [name] is a 82 y.o. male who presents for the following: check spot post scalp, 6wks, irritating The patient has spots, moles and lesions to be evaluated, some may be new or changing and the patient may have concern these could be cancer.  The following portions of the chart were reviewed this encounter and updated as appropriate: medications, allergies, medical history  Review of Systems:  No other skin or systemic complaints except as noted in HPI or Assessment and Plan.  Objective  Well appearing patient in no apparent distress; mood and affect are within normal limits. A focused examination was performed of the following areas: Scalp, face, arms, back Relevant exam findings are noted in the Assessment and Plan.  Scalp x 17 (17) Pink scaly macules   Assessment & Plan   ACTINIC DAMAGE WITH PRECANCEROUS ACTINIC KERATOSES Counseling for Topical Chemotherapy Management: Patient exhibits: - Severe, confluent actinic changes with pre-cancerous actinic keratoses that is secondary to cumulative UV radiation exposure over time - Condition that is severe; chronic, not at goal. - diffuse scaly erythematous macules and papules with underlying dyspigmentation - Discussed Prescription "Field Treatment" topical Chemotherapy for Severe, Chronic Confluent Actinic Changes with Pre-Cancerous Actinic Keratoses Field treatment involves treatment of an entire area of skin that has confluent Actinic Changes (Sun/ Ultraviolet light damage) and PreCancerous Actinic Keratoses by method of PhotoDynamic Therapy (PDT) and/or prescription Topical Chemotherapy agents such as 5-fluorouracil, 5-fluorouracil/calcipotriene, and/or imiquimod.  The purpose is to decrease the number of clinically evident and subclinical PreCancerous lesions to prevent progression to development of skin cancer by chemically destroying early precancer changes that may or may not be visible.  It has  been shown to reduce the risk of developing skin cancer in the treated area. As a result of treatment, redness, scaling, crusting, and open sores may occur during treatment course. One or more than one of these methods may be used and may have to be used several times to control, suppress and eliminate the PreCancerous changes. Discussed treatment course, expected reaction, and possible side effects. - Recommend daily broad spectrum sunscreen SPF 30+ to sun-exposed areas, reapply every 2 hours as needed.  - Staying in the shade or wearing long sleeves, sun glasses (UVA+UVB protection) and wide brim hats (4-inch brim around the entire circumference of the hat) are also recommended. - Call for new or changing lesions.  --In 1 month 02/07/23 Start 5-fluorouracil/calcipotriene cream twice a day for 10 days to affected areas including scalp. Prescription sent to [facility]. Patient advised they will receive an email to purchase the medication online and have it sent to their home. Patient provided with handout reviewing treatment course and side effects and advised to call or message [place] on MyChart with any concerns.  Reviewed course of treatment and expected reaction.  Patient advised to expect inflammation and crusting and advised that erosions are possible.  Patient advised to be diligent with sun protection during and after treatment. Counseled to keep medication out of reach of children and pets.   AK (actinic keratosis) (17) Scalp x 17  Many Hypertrophic  Destruction of lesion - Scalp x 17 Complexity: simple   Destruction method: cryotherapy   Informed consent: discussed and consent obtained   Timeout:  patient name, date of birth, surgical site, and procedure verified Lesion destroyed using liquid nitrogen: Yes   Region frozen until ice ball extended beyond lesion: Yes   Outcome: patient tolerated procedure well  with no complications   Post-procedure details: wound  care instructions given     Return in about 6 months (around 07/10/2023) for AK f/u.  I, Ardis Rowan, RMA, am acting as scribe for Armida Sans, MD .  Documentation: I have reviewed the above documentation for accuracy and completeness, and I agree with the above.  Armida Sans, MD

## 2023-01-30 NOTE — Progress Notes (Unsigned)
[name] [address] [place] [address] Phone: [phone] Subjective:   [name], am serving as a scribe for Dr. Antoine Primas.  I'm seeing this patient by the request  of:  Edwin Ivan, MD  CC: Low back pain follow-up  [id number]  Edwin [name] is a 82 y.o. male coming in with complaint of back and neck pain. OMT on 12/26/2022. Patient states same per usual. No new complaints.Had some tightness in the low back.  Describes it as a dull, throbbing aching discomfort.  Nothing that stopping him from still being relatively active.  Has noticed some more discomfort with certain activities.  Medications patient has been prescribed:   Taking:      Reviewed prior external information including notes and imaging from previsou exam, outside providers and external EMR if available.   As well as notes that were available from care everywhere and other healthcare systems.  Past medical history, social, surgical and family history all reviewed in electronic medical record.  No pertanent information unless stated regarding to the chief complaint.   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   right lateral deltoid BASAL CELL CARCINOMA, SUPERFICIAL AND NODULAR PATTERNS   BCC (basal cell carcinoma of skin) 04/23/2017   left lat neck, excision RESIDUAL BASAL CELL CARCINOMA, MARGINS FREE   BCC (basal cell carcinoma of skin) 03/13/2017   left lateral neck  BASAL CELL CARCINOMA, NODULAR PATTERN   BCC (basal cell carcinoma of skin) 12/25/2012   left pretibial   BCC (basal cell carcinoma of skin) 05/21/2007   left medial calf - , 0.8 X 0.5CM: SUPERFICIAL BASAL CELL CARCINOMA, BASE INVOLVED   BPH (benign prostatic hyperplasia)    Cancer (HCC)    Prostate Cancer   Dysrhythmia    SVT   Hyperlipidemia    Hypertension    PTSD (post-traumatic stress disorder)     Pulmonary nodules    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   right inferior cheek mandible SQUAMOUS CELL CARCINOMA IN SITU    Allergies  Allergen Reactions   Amoxicillin-Pot Clavulanate Diarrhea   Bacitracin-Neomycin-Polymyxin Hives   Hydrochlorothiazide    Lipitor [Atorvastatin]     Muscle soreness   Lisinopril Cough   Bacitracin-Polymyxin B Rash   Neosporin [Neomycin-Bacitracin Zn-Polymyx] Rash     Review of Systems:  No headache, visual changes, nausea, vomiting, diarrhea, constipation, dizziness, abdominal pain, skin rash, fevers, chills, night sweats, weight loss, swollen lymph nodes, body aches, joint swelling, chest pain, shortness of breath, mood changes. POSITIVE muscle aches  Objective  Blood pressure (!) 142/80, pulse 79, height 6\' 1"  (1.854 m), SpO2 97%.   General: No apparent distress alert and oriented x3 mood and affect normal, dressed appropriately.  HEENT: Pupils equal, extraocular movements intact  Respiratory: Patient's speak in full sentences and does not appear short of breath  Cardiovascular: No lower extremity edema, non tender, no erythema  MSK:  Back exam does have some loss lordosis noted.  Tightness noted especially with any type of extension of the neck.  Osteopathic findings  C7 flexed rotated and side bent left T3 extended rotated and side bent right inhaled rib T8 extended rotated and side bent left L1 flexed rotated and side bent right Sacrum right on right       Assessment and Plan:  Lumbar radiculopathy Low back does have tightness noted.  Some tenderness to palpation in the paraspinal musculature.  Continue to work on this.  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  Follow-up again in 6 to 8 weeks worsening pain consider injection    Nonallopathic problems  Decision today to treat with OMT was based on Physical Exam  After verbal consent patient was treated with HVLA, ME, FPR techniques in cervical, rib, thoracic, lumbar,  and sacral  areas  Patient tolerated the procedure well with improvement in symptoms  Patient given exercises, stretches and lifestyle modifications  See medications in patient instructions if given  Patient will follow up in 4-8 weeks    The above documentation has been reviewed and is accurate and complete [name], DO          Note: This dictation was prepared with Dragon dictation along with smaller phrase technology. Any transcriptional errors that result from this process are unintentional.

## 2023-01-31 ENCOUNTER — Ambulatory Visit: Payer: Medicare Other | Admitting: Family Medicine

## 2023-01-31 ENCOUNTER — Encounter: Payer: Self-pay | Admitting: Family Medicine

## 2023-01-31 VITALS — BP 142/80 | HR 79 | Ht 73.0 in

## 2023-01-31 DIAGNOSIS — M5416 Radiculopathy, lumbar region: Secondary | ICD-10-CM | POA: Diagnosis not present

## 2023-01-31 DIAGNOSIS — M9901 Segmental and somatic dysfunction of cervical region: Secondary | ICD-10-CM | POA: Diagnosis not present

## 2023-01-31 DIAGNOSIS — M9902 Segmental and somatic dysfunction of thoracic region: Secondary | ICD-10-CM

## 2023-01-31 DIAGNOSIS — M9903 Segmental and somatic dysfunction of lumbar region: Secondary | ICD-10-CM | POA: Diagnosis not present

## 2023-01-31 DIAGNOSIS — M9908 Segmental and somatic dysfunction of rib cage: Secondary | ICD-10-CM | POA: Diagnosis not present

## 2023-01-31 DIAGNOSIS — M9904 Segmental and somatic dysfunction of sacral region: Secondary | ICD-10-CM

## 2023-01-31 NOTE — Assessment & Plan Note (Signed)
Low back does have tightness noted.  Some tenderness to palpation in the paraspinal musculature.  Continue to work on this.  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  Follow-up again in 6 to 8 weeks worsening pain consider injection

## 2023-01-31 NOTE — Patient Instructions (Addendum)
Good to see you! Buy a new couch See you again in 6-8 weeks

## 2023-03-05 NOTE — Progress Notes (Unsigned)
[name] [address] [place] [address] Phone: [phone] Subjective:   [name], am serving as a scribe for Dr. Antoine Primas.  I'm seeing this patient by the request  of:  Marisue Ivan, MD  CC: Low back pain follow-up  [id number]  Edwin [name] is a 82 y.o. male coming in with complaint of back and neck pain. OMT on 01/31/2023. Patient states that he has been doing ok.          Reviewed prior external information including notes and imaging from previsou exam, outside providers and external EMR if available.   As well as notes that were available from care everywhere and other healthcare systems.  Past medical history, social, surgical and family history all reviewed in electronic medical record.  No pertanent information unless stated regarding to the chief complaint.   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   right lateral deltoid BASAL CELL CARCINOMA, SUPERFICIAL AND NODULAR PATTERNS   BCC (basal cell carcinoma of skin) 04/23/2017   left lat neck, excision RESIDUAL BASAL CELL CARCINOMA, MARGINS FREE   BCC (basal cell carcinoma of skin) 03/13/2017   left lateral neck  BASAL CELL CARCINOMA, NODULAR PATTERN   BCC (basal cell carcinoma of skin) 12/25/2012   left pretibial   BCC (basal cell carcinoma of skin) 05/21/2007   left medial calf - , 0.8 X 0.5CM: SUPERFICIAL BASAL CELL CARCINOMA, BASE INVOLVED   BPH (benign prostatic hyperplasia)    Cancer (HCC)    Prostate Cancer   Dysrhythmia    SVT   Hyperlipidemia    Hypertension    PTSD (post-traumatic stress disorder)    Pulmonary nodules    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   right inferior cheek mandible SQUAMOUS CELL CARCINOMA IN SITU    Allergies  Allergen Reactions   Amoxicillin-Pot Clavulanate Diarrhea   Bacitracin-Neomycin-Polymyxin Hives   Hydrochlorothiazide     Lipitor [Atorvastatin]     Muscle soreness   Lisinopril Cough   Bacitracin-Polymyxin B Rash   Neosporin [Neomycin-Bacitracin Zn-Polymyx] Rash     Review of Systems:  No headache, visual changes, nausea, vomiting, diarrhea, constipation, dizziness, abdominal pain, skin rash, fevers, chills, night sweats, weight loss, swollen lymph nodes, body aches, joint swelling, chest pain, shortness of breath, mood changes. POSITIVE muscle aches  Objective  Blood pressure 136/86, height 6\' 1"  (1.854 m), SpO2 96%.   General: No apparent distress alert and oriented x3 mood and affect normal, dressed appropriately.  HEENT: Pupils equal, extraocular movements intact  Respiratory: Patient's speak in full sentences and does not appear short of breath  Cardiovascular: No lower extremity edema, non tender, no erythema  MSK:  Back low back does have some loss lordosis noted.  Some tenderness to palpation of the paraspinal musculature.  Tightness with Pearlean Brownie right greater than left.  Osteopathic findings  C3 flexed rotated and side bent right C6 flexed rotated and side bent left T3 extended rotated and side bent right inhaled rib T9 extended rotated and side bent left L2 flexed rotated and side bent right L5 F RS left  Sacrum right on right    Assessment and Plan:  Lumbar radiculopathy Low back increase noted.  Patient has had increasing discomfort and pain again.  He thinks it is secondary to potentially him getting older.  Discussed Cymbalta with patient having increasing in stress  with his work as well.  The patient's warned the potential side effects.  Has not had any difficulty with different medications in this class before.  Will start 20 mg and see how patient responds.  Follow-up with me again in 6 to 8 weeks otherwise    Nonallopathic problems  Decision today to treat with OMT was based on Physical Exam  After verbal consent patient was treated with HVLA, ME, FPR techniques in  cervical, rib, thoracic, lumbar, and sacral  areas  Patient tolerated the procedure well with improvement in symptoms  Patient given exercises, stretches and lifestyle modifications  See medications in patient instructions if given  Patient will follow up in 4-8 weeks     The above documentation has been reviewed and is accurate and complete [name], DO         Note: This dictation was prepared with Dragon dictation along with smaller phrase technology. Any transcriptional errors that result from this process are unintentional.

## 2023-03-06 ENCOUNTER — Ambulatory Visit: Payer: Medicare Other | Admitting: Family Medicine

## 2023-03-06 ENCOUNTER — Encounter: Payer: Self-pay | Admitting: Family Medicine

## 2023-03-06 VITALS — BP 136/86 | Ht 73.0 in

## 2023-03-06 DIAGNOSIS — M9901 Segmental and somatic dysfunction of cervical region: Secondary | ICD-10-CM | POA: Diagnosis not present

## 2023-03-06 DIAGNOSIS — M9908 Segmental and somatic dysfunction of rib cage: Secondary | ICD-10-CM | POA: Diagnosis not present

## 2023-03-06 DIAGNOSIS — M9903 Segmental and somatic dysfunction of lumbar region: Secondary | ICD-10-CM | POA: Diagnosis not present

## 2023-03-06 DIAGNOSIS — M5416 Radiculopathy, lumbar region: Secondary | ICD-10-CM

## 2023-03-06 DIAGNOSIS — M9902 Segmental and somatic dysfunction of thoracic region: Secondary | ICD-10-CM

## 2023-03-06 DIAGNOSIS — M9904 Segmental and somatic dysfunction of sacral region: Secondary | ICD-10-CM

## 2023-03-06 MED ORDER — DULOXETINE HCL 20 MG PO CPEP: 20.0000 mg | ORAL_CAPSULE | Freq: Every day | ORAL | 0 refills | Status: DC

## 2023-03-06 NOTE — Assessment & Plan Note (Signed)
Low back increase noted.  Patient has had increasing discomfort and pain again.  He thinks it is secondary to potentially him getting older.  Discussed Cymbalta with patient having increasing in stress with his work as well.  The patient's warned the potential side effects.  Has not had any difficulty with different medications in this class before.  Will start 20 mg and see how patient responds.  Follow-up with me again in 6 to 8 weeks otherwise

## 2023-03-06 NOTE — Patient Instructions (Signed)
Cymbalta 20 mg See me again in 6 weeks

## 2023-04-10 NOTE — Progress Notes (Unsigned)
[name] [address] [place] [address] Phone: [phone] Subjective:   [name], am serving as a scribe for Dr. Antoine Primas.  I'm seeing this patient by the request  of:  Marisue Ivan, MD  CC: Back pain and neck pain follow-up  [id number]  Edwin [name] is a 82 y.o. male coming in with complaint of back and neck pain. OMT on 03/06/2023.  Patient states same per usual. No new concerns.  Medications patient has been prescribed: cymbalta  Taking:         Reviewed prior external information including notes and imaging from previsou exam, outside providers and external EMR if available.   As well as notes that were available from care everywhere and other healthcare systems.  Past medical history, social, surgical and family history all reviewed in electronic medical record.  No pertanent information unless stated regarding to the chief complaint.   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   right lateral deltoid BASAL CELL CARCINOMA, SUPERFICIAL AND NODULAR PATTERNS   BCC (basal cell carcinoma of skin) 04/23/2017   left lat neck, excision RESIDUAL BASAL CELL CARCINOMA, MARGINS FREE   BCC (basal cell carcinoma of skin) 03/13/2017   left lateral neck  BASAL CELL CARCINOMA, NODULAR PATTERN   BCC (basal cell carcinoma of skin) 12/25/2012   left pretibial   BCC (basal cell carcinoma of skin) 05/21/2007   left medial calf - , 0.8 X 0.5CM: SUPERFICIAL BASAL CELL CARCINOMA, BASE INVOLVED   BPH (benign prostatic hyperplasia)    Cancer (HCC)    Prostate Cancer   Dysrhythmia    SVT   Hyperlipidemia    Hypertension    PTSD (post-traumatic stress disorder)    Pulmonary nodules    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   right inferior cheek mandible SQUAMOUS CELL CARCINOMA IN SITU    Allergies  Allergen Reactions   Amoxicillin-Pot  Clavulanate Diarrhea   Bacitracin-Neomycin-Polymyxin Hives   Hydrochlorothiazide    Lipitor [Atorvastatin]     Muscle soreness   Lisinopril Cough   Bacitracin-Polymyxin B Rash   Neosporin [Neomycin-Bacitracin Zn-Polymyx] Rash     Review of Systems:  No headache, visual changes, nausea, vomiting, diarrhea, constipation, dizziness, abdominal pain, skin rash, fevers, chills, night sweats, weight loss, swollen lymph nodes, body aches, joint swelling, chest pain, shortness of breath, mood changes. POSITIVE muscle aches  Objective  Blood pressure 136/80, pulse 63, height 6\' 1"  (1.854 m), SpO2 96%.   General: No apparent distress alert and oriented x3 mood and affect normal, dressed appropriately.  HEENT: Pupils equal, extraocular movements intact  Respiratory: Patient's speak in full sentences and does not appear short of breath  Cardiovascular: No lower extremity edema, non tender, no erythema  Gait MSK:  Back does have some loss of lordosis noted.  Some tenderness to palpation.  Does have tightness noted in the paraspinal muscles in the lumbar spine.  Seems to be bilateral.  Tightness with FABER test and tight with the hip flexors bilaterally  Osteopathic findings  C5 flexed rotated and side bent right T5 extended rotated and side bent left L2 flexed rotated and side bent right L3 flexed rotated and side bent right L4 flexed rotated and side bent left Sacrum right on right     Assessment and Plan:  Lumbar radiculopathy Continue to work on patient's back pain.  Patient is scheduled to have another PSA in October.  We can order that if necessary.  Discussed with patient to continue to work on increasing activity.  Discussed icing regimen.  Patient was noncompliant with the Cymbalta and do think it will be helpful.  Follow-up with me again in 6 weeks otherwise.    Nonallopathic problems  Decision today to treat with OMT was based on Physical Exam  After verbal consent patient  was treated with HVLA, ME, FPR techniques in cervical, rib, thoracic, lumbar, and sacral  areas  Patient tolerated the procedure well with improvement in symptoms  Patient given exercises, stretches and lifestyle modifications  See medications in patient instructions if given  Patient will follow up in 4-8 weeks    The above documentation has been reviewed and is accurate and complete [name], DO          Note: This dictation was prepared with Dragon dictation along with smaller phrase technology. Any transcriptional errors that result from this process are unintentional.

## 2023-04-11 ENCOUNTER — Encounter: Payer: Self-pay | Admitting: Family Medicine

## 2023-04-11 ENCOUNTER — Ambulatory Visit: Payer: Medicare Other | Admitting: Family Medicine

## 2023-04-11 VITALS — BP 136/80 | HR 63 | Ht 73.0 in

## 2023-04-11 DIAGNOSIS — M9903 Segmental and somatic dysfunction of lumbar region: Secondary | ICD-10-CM

## 2023-04-11 DIAGNOSIS — M9901 Segmental and somatic dysfunction of cervical region: Secondary | ICD-10-CM

## 2023-04-11 DIAGNOSIS — M9904 Segmental and somatic dysfunction of sacral region: Secondary | ICD-10-CM | POA: Diagnosis not present

## 2023-04-11 DIAGNOSIS — M5416 Radiculopathy, lumbar region: Secondary | ICD-10-CM

## 2023-04-11 DIAGNOSIS — M9908 Segmental and somatic dysfunction of rib cage: Secondary | ICD-10-CM

## 2023-04-11 DIAGNOSIS — M9902 Segmental and somatic dysfunction of thoracic region: Secondary | ICD-10-CM

## 2023-04-11 NOTE — Assessment & Plan Note (Signed)
Continue to work on patient's back pain.  Patient is scheduled to have another PSA in October.  We can order that if necessary.  Discussed with patient to continue to work on increasing activity.  Discussed icing regimen.  Patient was noncompliant with the Cymbalta and do think it will be helpful.  Follow-up with me again in 6 weeks otherwise.

## 2023-04-11 NOTE — Patient Instructions (Signed)
Good to see you! See you again in 6 weeks

## 2023-04-23 ENCOUNTER — Ambulatory Visit: Payer: Medicare Other | Admitting: Family Medicine

## 2023-04-23 ENCOUNTER — Encounter: Payer: Self-pay | Admitting: Family Medicine

## 2023-04-23 VITALS — BP 120/82 | HR 66 | Ht 73.0 in

## 2023-04-23 DIAGNOSIS — M9904 Segmental and somatic dysfunction of sacral region: Secondary | ICD-10-CM

## 2023-04-23 DIAGNOSIS — M9902 Segmental and somatic dysfunction of thoracic region: Secondary | ICD-10-CM

## 2023-04-23 DIAGNOSIS — I471 Supraventricular tachycardia, unspecified: Secondary | ICD-10-CM | POA: Diagnosis not present

## 2023-04-23 DIAGNOSIS — M2550 Pain in unspecified joint: Secondary | ICD-10-CM

## 2023-04-23 DIAGNOSIS — M9903 Segmental and somatic dysfunction of lumbar region: Secondary | ICD-10-CM | POA: Diagnosis not present

## 2023-04-23 DIAGNOSIS — M5416 Radiculopathy, lumbar region: Secondary | ICD-10-CM | POA: Diagnosis not present

## 2023-04-23 IMAGING — MR MR PELVIS WO/W CM
5 of 8 series · 29 of 48 positions shown · IV contrast (multihance)
Comparison: None.

CLINICAL DATA: Hip pain, radiculopathy

EXAM:
MRI PELVIS WITHOUT AND WITH CONTRAST
TECHNIQUE: Multiplanar multisequence MR imaging of the pelvis was performed
both before and after administration of intravenous contrast.
CONTRAST:  20mL MULTIHANCE GADOBENATE DIMEGLUMINE 529 MG/ML IV SOLN

[Series 5: T1 · coronal · 4.0mm · 1.12mm/px · 5 of 31 slices shown (1 of 2)]
[im 1/31]
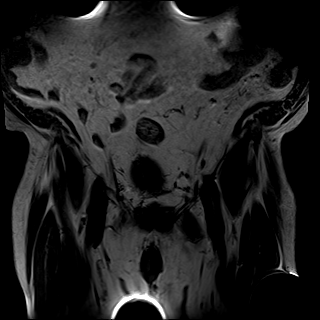
[im 8/31]
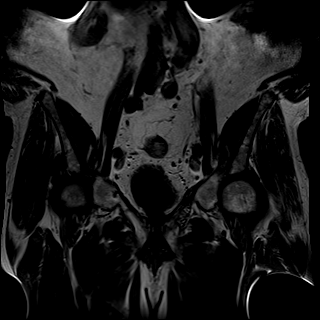
[im 16/31]
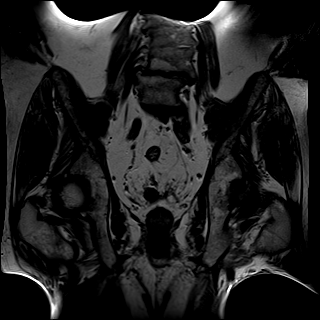
[im 23/31]
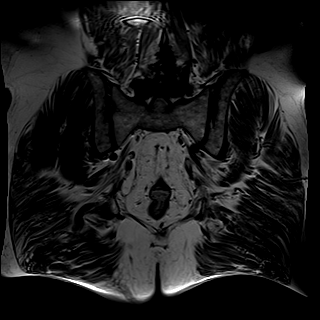
[im 31/31]
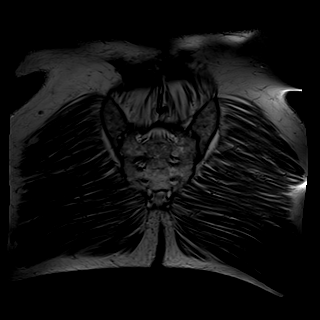

[Series 6: STIR · coronal · 4.0mm · 0.70mm/px · 5 of 31 slices shown]
[im 1/31]
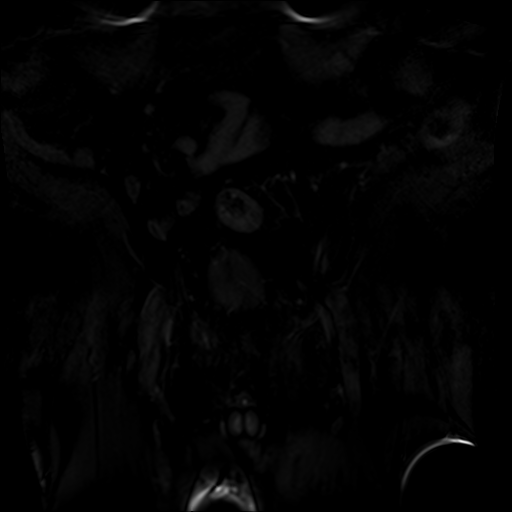
[im 8/31]
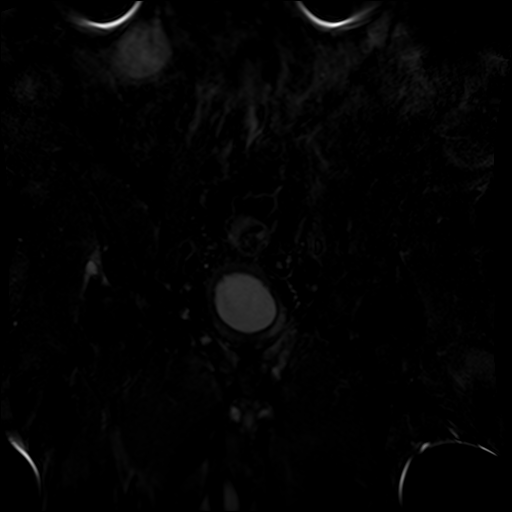
[im 16/31]
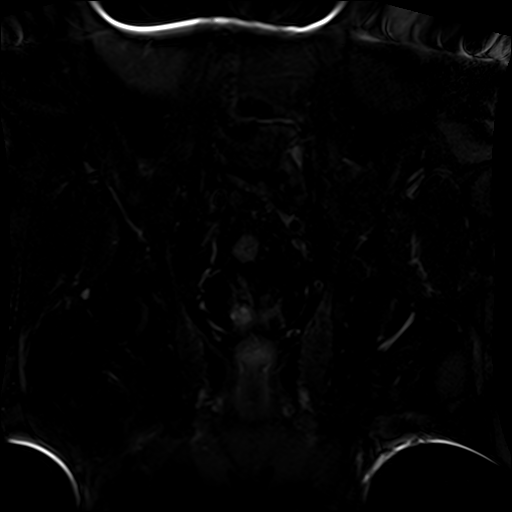
[im 23/31]
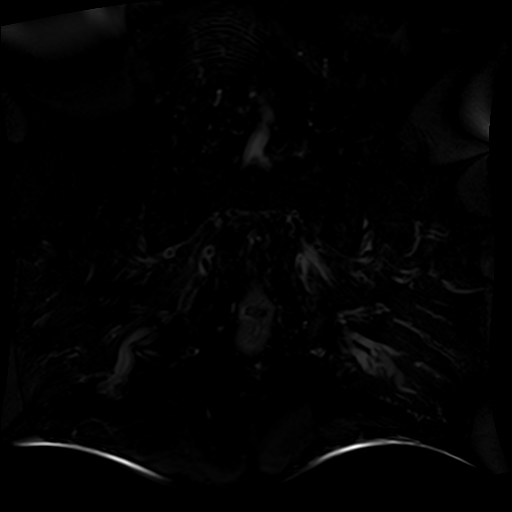
[im 31/31]
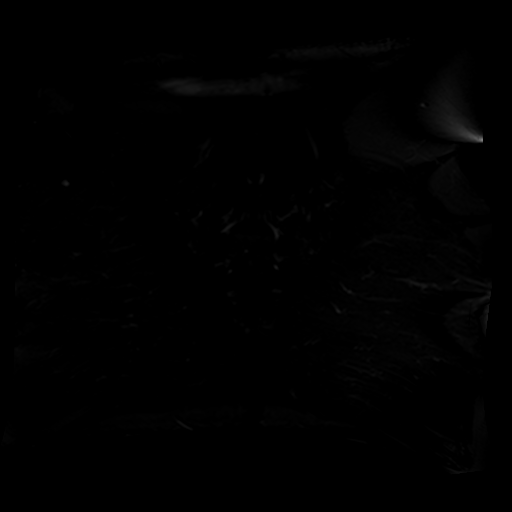

[Series 7: T1 · axial · 4.0mm · 1.06mm/px · z∈[-54,+189]mm · 7 of 46 slices shown (2 of 2)]
[im 1/46]
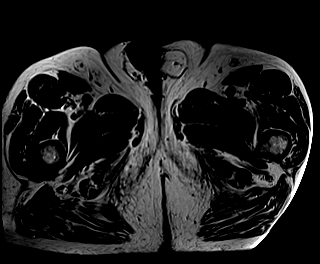
[im 8/46]
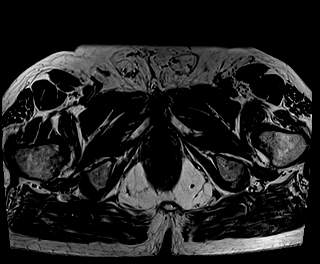
[im 16/46]
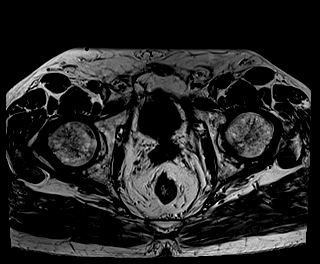
[im 23/46]
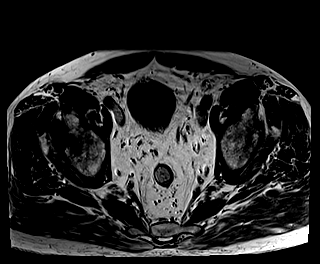
[im 31/46]
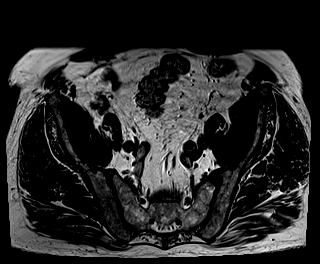
[im 38/46]
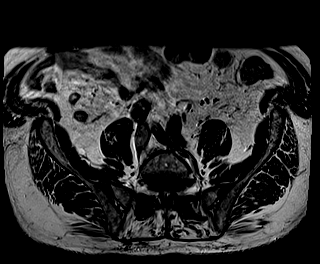
[im 46/46]
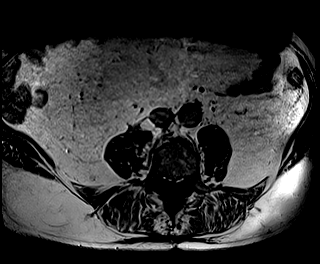

[Series 8: T2 fat-sat · axial · 4.0mm · 1.00mm/px · z∈[-54,+189]mm · 7 of 46 slices shown (1 of 2)]
[im 1/46]
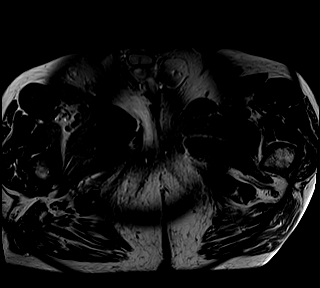
[im 8/46]
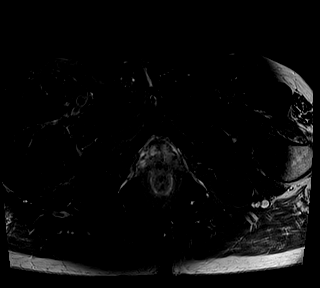
[im 16/46]
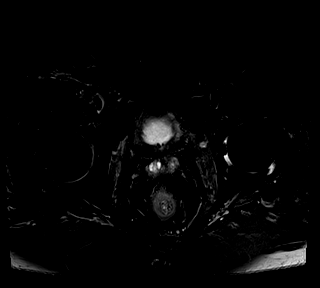
[im 23/46]
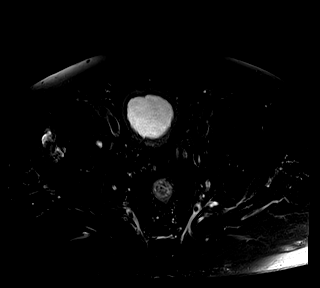
[im 31/46]
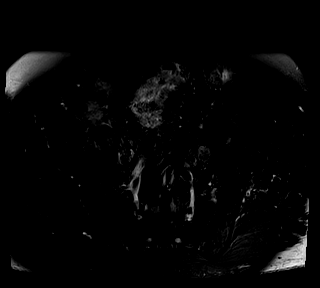
[im 38/46]
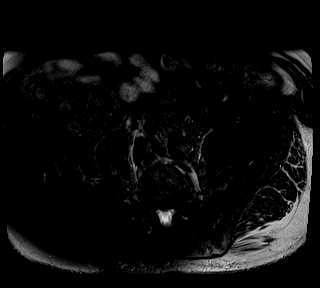
[im 46/46]
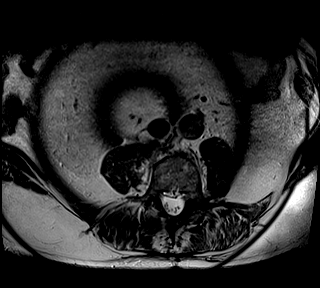

[Series 9: T2 fat-sat · sagittal · 4.5mm · 0.51mm/px · 5 of 50 slices shown (2 of 2)]
[im 1/50]
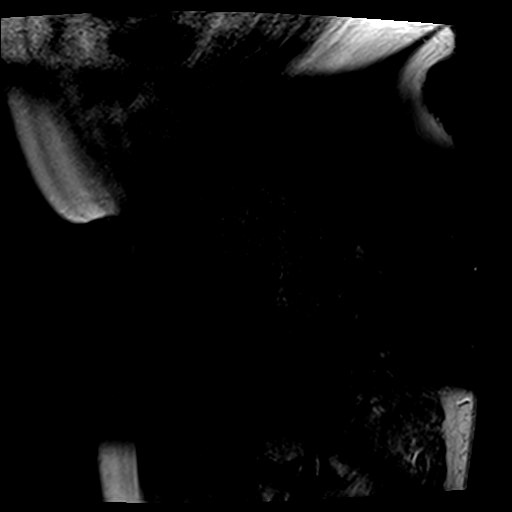
[im 9/50]
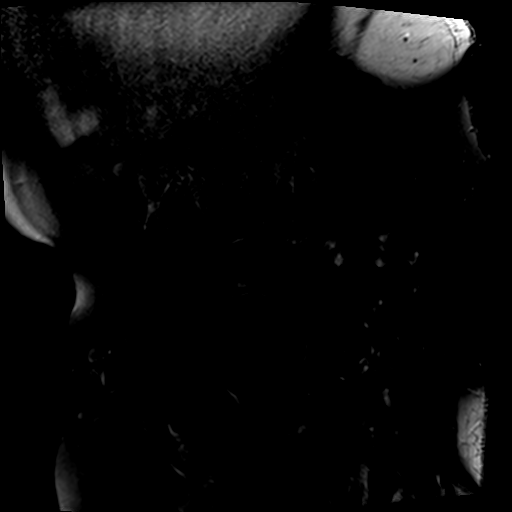
[im 17/50]
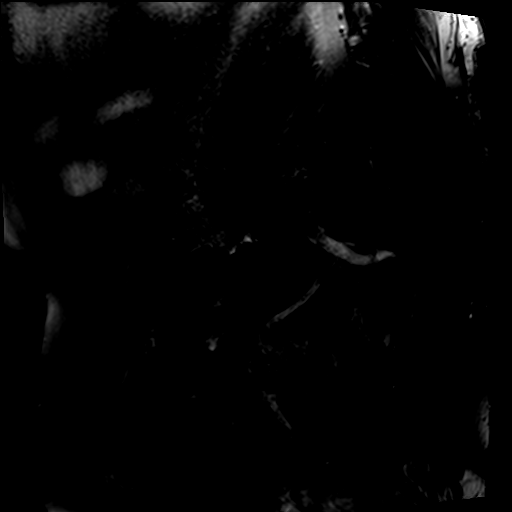
[im 25/50]
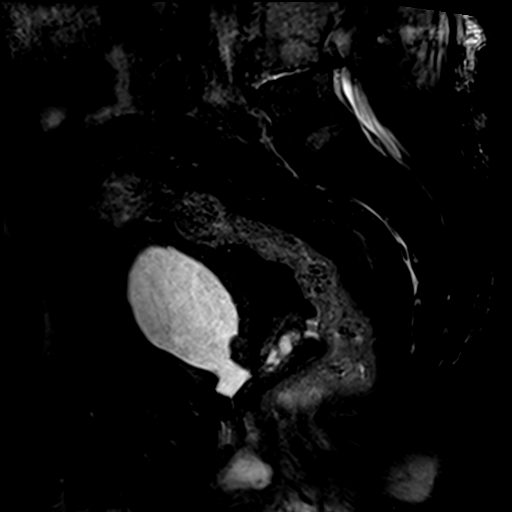
[im 33/50]
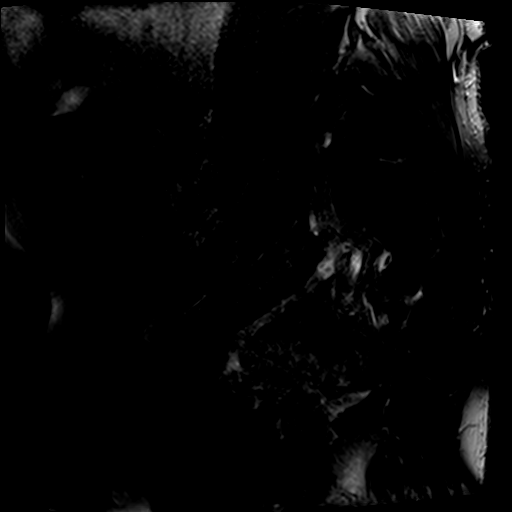

[29 of 48 positions shown; findings below may reference images not displayed]

FINDINGS: Bones/Joint/Cartilage

There is no acute fracture. There is no evidence of femoral head
avascular necrosis. There is right-sided degenerative superior and
anterior superior labral tearing. There is cyst formation measuring
up to 2.0 x 1.4 x 1.7 cm along the superolateral acetabular margin,
which may be paralabral cyst or decompressed subchondral cyst into
the soft tissues (axial T2 image 22, coronal stir image 21). There
is moderate right hip osteoarthritis with subchondral cyst formation
in the acetabulum. The cystic areas demonstrated mild rim
enhancement consistent with cyst. There is no solid enhancement.

There is moderate left hip osteoarthritis as well with mild bony
edema in the acetabulum. Milder anterior superior degenerative
labral tearing on the left.

There is subchondral marrow edema within the sacrum at the anterior
margin of the left sacroiliac joint compatible with degenerative
arthritis.

There is focal low T1/high T2 within the bilateral pubic bones at
the pubic symphysis, with angular margins consistent with bony
infarcts.

Levoconvex lumbar curvature with multilevel degenerative changes,
see separately dictated lumbar spine MRI.

Ligaments

Grossly intact.

Muscles and Tendons
Moderate gluteus minimus muscle atrophy bilaterally. Gluteal tendons
appear intact. Mild peritendinitis along the right distal gluteus
minimus and medius tendons. The proximal hamstrings are
unremarkable. There is faint, minimal intramuscular edema within the
adductor muscles at their origins at the pubic bones (axial T2 image
36).

Soft tissue
No focal fluid collection. Circumferential bladder wall thickening.
Prior prostatectomy. The other pelvic structures are unremarkable.
No abnormal soft tissue enhancement.
IMPRESSION: Moderate bilateral hip osteoarthritis with degenerative superior and
anterior superior labral tearing. Cyst formation along the
right-sided superolateral acetabular margin, which is either a
paralabral cyst or decompressed subchondral cyst into the adjacent
soft tissues.

Intramedullary bone infarcts within the pubic bones near the pubic
symphysis.

Mild left-sided degenerative arthritis of the anterior aspect of the
sacroiliac joint.

Mild right distal gluteus minimus and medius peritendinitis. Intact
gluteal tendons.

Minimal, intramuscular edema within the adductor muscles at the
origins at the pubic bones, consistent with reactive edema or
low-grade muscle strain.

Multilevel degenerative changes of the lumbar spine, see separately
dictated lumbar spine MRI.

Circumferential bladder wall thickening, recommend correlation with
urinalysis.

## 2023-04-23 IMAGING — MR MR LUMBAR SPINE W/O CM
4 of 5 series · 24 of 48 positions shown · non-contrast
Comparison: Whole-body bone scan 05/13/2019. CT Abdomen and Pelvis
05/13/2019.

CLINICAL DATA: 80-year-old male with persistent low back pain.
Bilateral hip pain right greater than left. Prostate cancer.

EXAM:
MRI LUMBAR SPINE WITHOUT CONTRAST
TECHNIQUE: Multiplanar, multisequence MR imaging of the lumbar spine was
performed. No intravenous contrast was administered.

[Series 3: T2 · sagittal · 4.0mm · 0.53mm/px · 6 of 17 slices shown (1 of 2)]
[im 1/17]
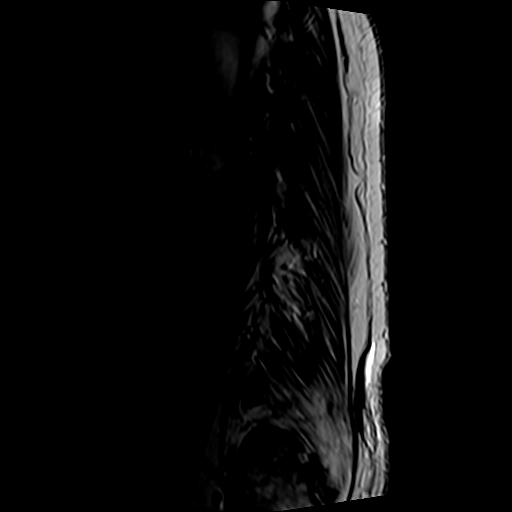
[im 4/17]
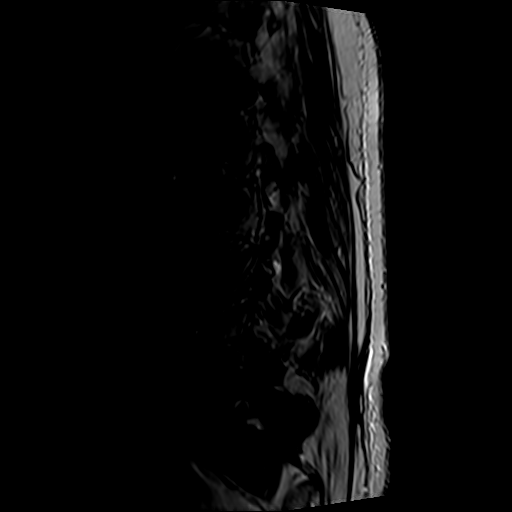
[im 7/17]
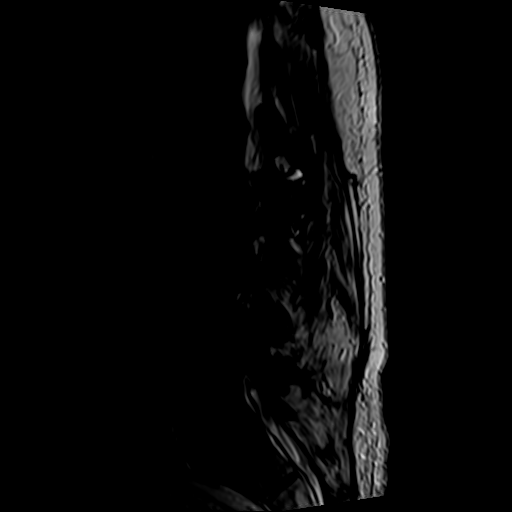
[im 10/17]
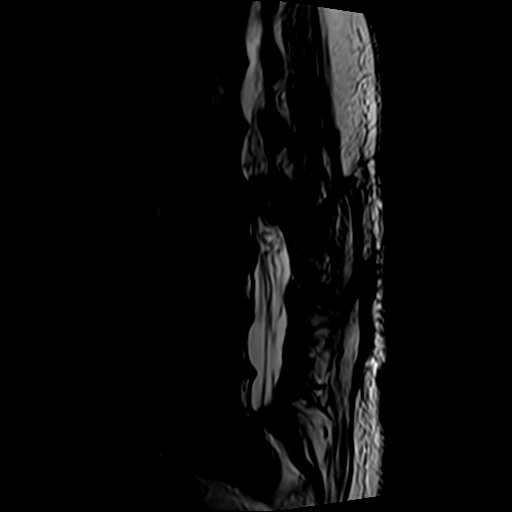
[im 13/17]
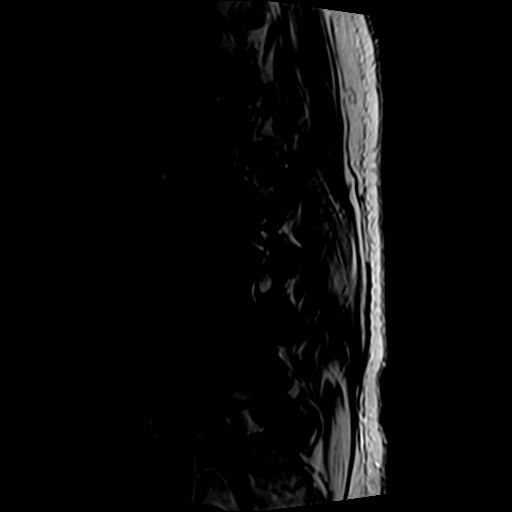
[im 17/17]
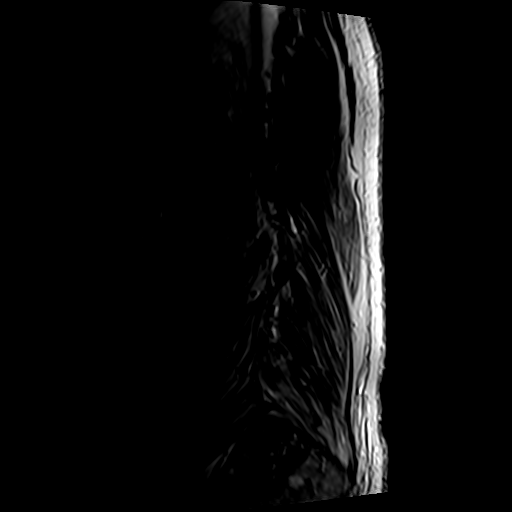

[Series 5: T1 · sagittal · 4.0mm · 0.53mm/px · 6 of 17 slices shown (1 of 2)]
[im 1/17]
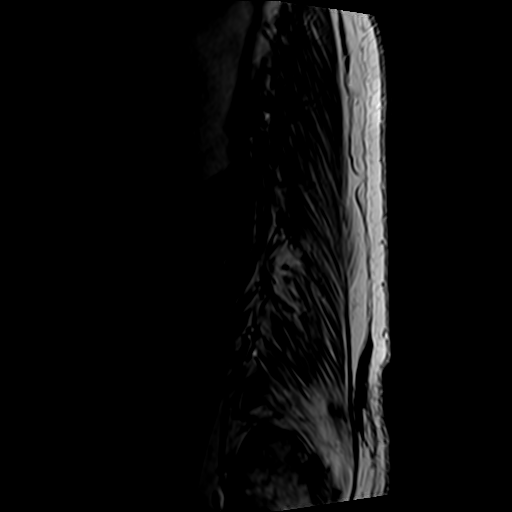
[im 4/17]
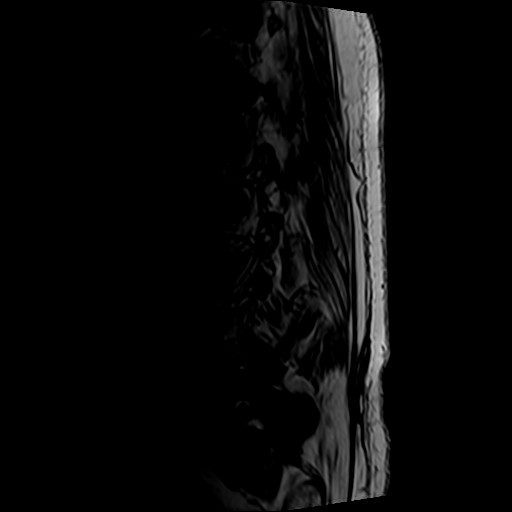
[im 7/17]
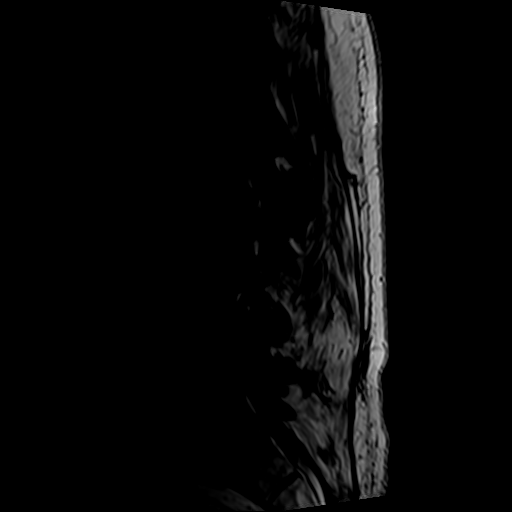
[im 10/17]
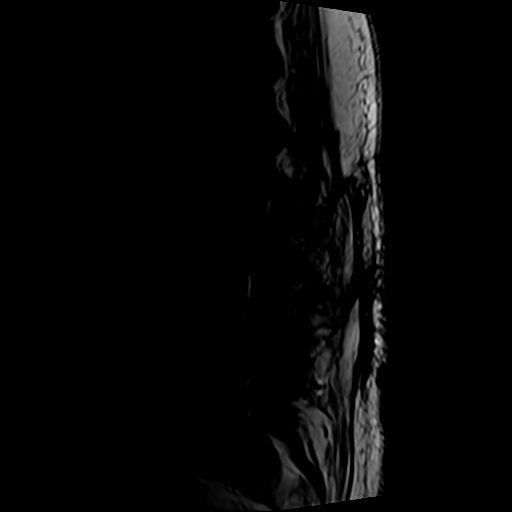
[im 13/17]
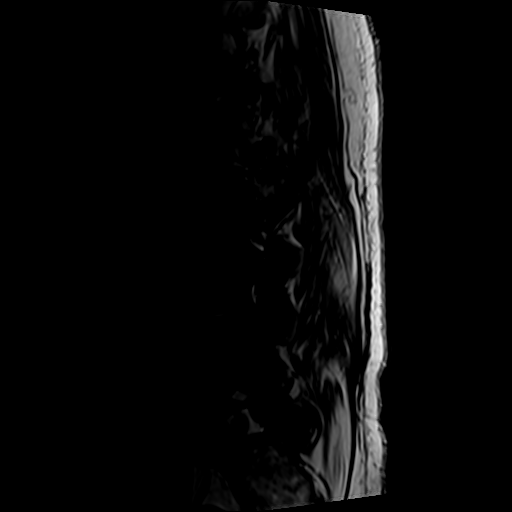
[im 17/17]
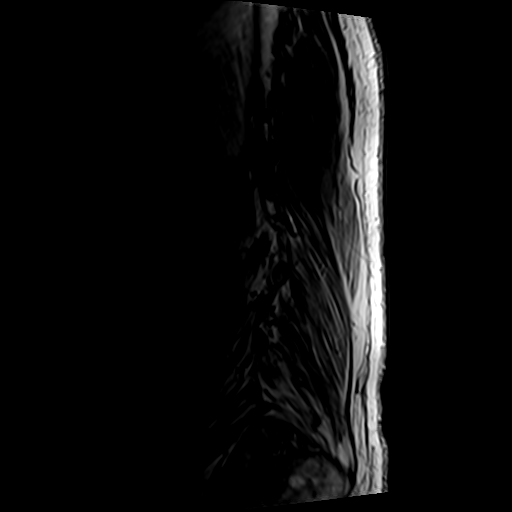

[Series 6: T2 · axial · 4.0mm · 0.70mm/px · z∈[-142,+60]mm · 9 of 41 slices shown (2 of 2)]
[im 1/41]
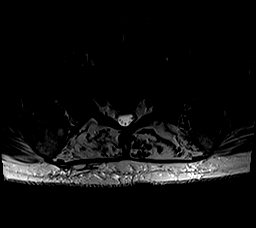
[im 6/41]
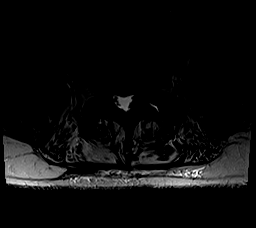
[im 12/41]
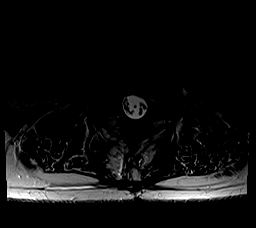
[im 18/41]
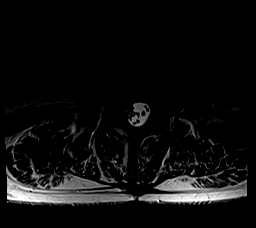
[im 21/41]
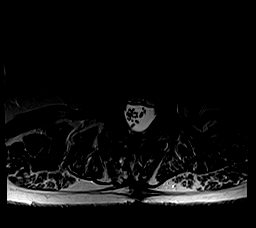
[im 23/41]
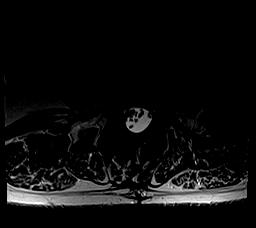
[im 29/41]
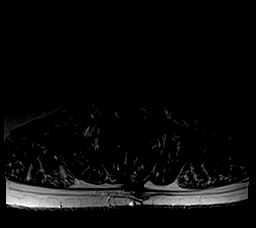
[im 35/41]
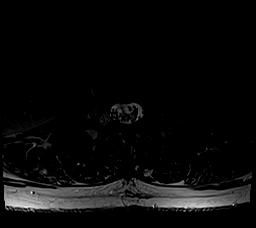
[im 41/41]
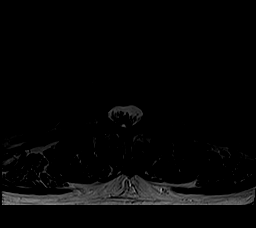

[Series 7: T1 · axial · 4.0mm · 0.35mm/px · z∈[-117,+31]mm · 3 of 41 slices shown (2 of 2)]
[im 6/41]
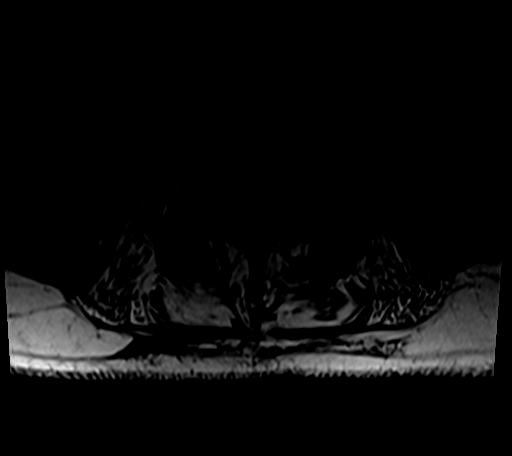
[im 21/41]
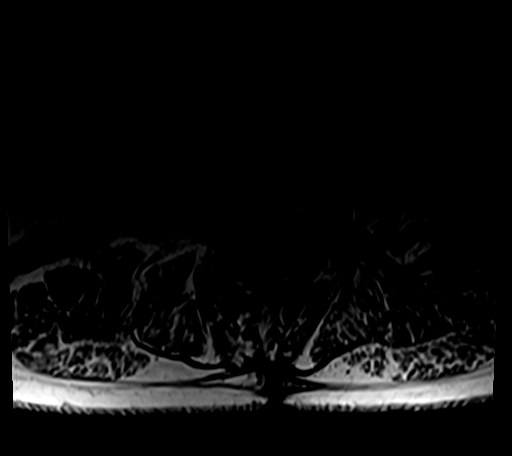
[im 35/41]
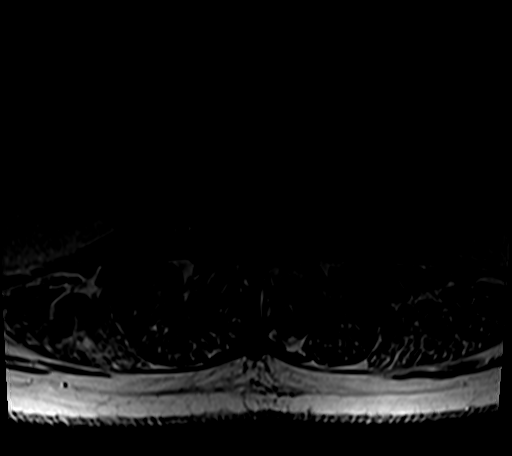

[24 of 48 positions shown; findings below may reference images not displayed]

FINDINGS: Segmentation:  Normal.

Alignment: Levoconvex lumbar scoliosis with straightening of
lordosis. No significant spondylolisthesis.

Vertebrae: Degenerative appearing interbody ankylosis L2-L3 and
L3-L4. Previous posterior decompression of those levels,
postoperative details are below. Background bone marrow signal
within normal limits, T1 intrinsic benign vertebral body hemangiomas
at T12 and L1. Probable small round benign L3 vertebral hemangioma
measuring 10 mm series 4, image 11, with mixed but some intrinsic T1
hyperintensity (series 5, image 11). Degenerative endplate marrow
signal changes at L1-L2 and L4-L5. Intact visible sacrum and SI
joints.

Conus medullaris and cauda equina: Conus extends to the T12 level.
No lower spinal cord or conus signal abnormality.

Paraspinal and other soft tissues: Stable visible abdominal viscera.
Tortuous aortoiliac vessels. Mild postoperative changes to the
posterior lumbar paraspinal soft tissues.

Disc levels:

T11-T12: Mild disc bulge and facet hypertrophy. No stenosis.

T12-L1: Mild circumferential disc bulge and facet hypertrophy. No
stenosis.

L1-L2: Disc desiccation and disc space loss. Vacuum disc. Bulky
circumferential disc osteophyte complex and moderate facet and
ligament flavum hypertrophy greater on the left. Severe left lateral
recess stenosis (left L2 nerve level). Moderate to severe spinal and
right lateral recess stenosis (right L2 nerve level). Mild to
moderate left and mild right L1 foraminal stenosis.

L2-L3: Previous posterior decompression. Interbody ankylosis. Bulky
endplate spurring. Mild to moderate residual facet hypertrophy. No
significant stenosis.

L3-L4: Previous posterior decompression. Interbody ankylosis. Bulky
endplate spurring. Mild residual facet hypertrophy. No spinal
stenosis, but mild to moderate bilateral osseous foraminal stenosis,
and mild right lateral recess stenosis (right L4 nerve level).

L4-L5: Severe disc space loss. Bulky circumferential disc osteophyte
complex eccentric to the left posteriorly. Prior posterior
decompression with moderate residual facet hypertrophy. No spinal
stenosis but moderate to severe left lateral recess stenosis (left
L5 nerve level). Severe left and moderate to severe right L4 neural
foraminal stenosis.

L5-S1: Mild disc bulging and endplate spurring. Severe facet
hypertrophy with degenerative facet joint fluid on the left. No
spinal stenosis. No convincing lateral recess stenosis. Mild
bilateral L5 foraminal stenosis.
IMPRESSION: 1. No osseous metastatic disease identified. Benign vertebral body
hemangiomas at several levels.

2. Advanced chronic lumbar spine degeneration.
Interbody ankylosis at L2-L3 and L3-L4, with prior posterior
decompression of those levels and L4-L5.
Bulky endplate degeneration throughout and advanced disc and
posterior element degeneration at the adjacent L1-L2 and L4-L5
segments. Severe facet degeneration also at L5-S1.

3. L1-L2 multifactorial moderate to severe spinal, right lateral
recess, and left foraminal stenosis.
L4-L5 moderate to severe left lateral recess and bilateral foraminal
stenosis.

## 2023-04-23 MED ORDER — METHYLPREDNISOLONE ACETATE 80 MG/ML IJ SUSP
80.0000 mg | Freq: Once | INTRAMUSCULAR | Status: AC
Start: 2023-04-23 — End: 2023-04-23
  Administered 2023-04-23: 80 mg via INTRAMUSCULAR

## 2023-04-23 MED ORDER — KETOROLAC TROMETHAMINE 60 MG/2ML IM SOLN
60.0000 mg | Freq: Once | INTRAMUSCULAR | Status: AC
Start: 2023-04-23 — End: 2023-04-23
  Administered 2023-04-23: 60 mg via INTRAMUSCULAR

## 2023-04-23 NOTE — Assessment & Plan Note (Signed)
Patient's cardiologist has left the practice he was asked and we will refer him based on his his direction.

## 2023-04-23 NOTE — Addendum Note (Signed)
Addended by: Evon Slack on: 04/23/2023 09:54 AM   Modules accepted: Orders

## 2023-04-23 NOTE — Progress Notes (Signed)
[name] [address] [place] [address] Phone: [phone] Subjective:   [name], am serving as a scribe for Dr. Antoine Primas.  I'm seeing this patient by the request  of:  Marisue Ivan, MD  CC: Back and neck pain follow-up  ION:[id number]  Edwin [name] is a 82 y.o. male coming in with complaint of back and neck pain. Patient states that his lower back on L side. Pain can be sharp and started Saturday.           Reviewed prior external information including notes and imaging from previsou exam, outside providers and external EMR if available.   As well as notes that were available from care everywhere and other healthcare systems.  Past medical history, social, surgical and family history all reviewed in electronic medical record.  No pertanent information unless stated regarding to the chief complaint.   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   right lateral deltoid BASAL CELL CARCINOMA, SUPERFICIAL AND NODULAR PATTERNS   BCC (basal cell carcinoma of skin) 04/23/2017   left lat neck, excision RESIDUAL BASAL CELL CARCINOMA, MARGINS FREE   BCC (basal cell carcinoma of skin) 03/13/2017   left lateral neck  BASAL CELL CARCINOMA, NODULAR PATTERN   BCC (basal cell carcinoma of skin) 12/25/2012   left pretibial   BCC (basal cell carcinoma of skin) 05/21/2007   left medial calf - , 0.8 X 0.5CM: SUPERFICIAL BASAL CELL CARCINOMA, BASE INVOLVED   BPH (benign prostatic hyperplasia)    Cancer (HCC)    Prostate Cancer   Dysrhythmia    SVT   Hyperlipidemia    Hypertension    PTSD (post-traumatic stress disorder)    Pulmonary nodules    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 POSITIVE muscle aches  Objective  Blood pressure 120/82, pulse 66, height 6\' 1"  (1.854 m), SpO2 98%.   General: No apparent distress alert and oriented x3 mood and affect normal, dressed appropriately.  HEENT: Pupils equal, extraocular movements intact  Respiratory: Patient's speak in full sentences and does not appear short of breath  Cardiovascular: No lower extremity edema, non tender, no erythema  Low back does have significant tightness noted in the left-sided paraspinal musculature.  Severe enough the patient does have significant limitation in range of motion.  Patient has difficulty with even extension of the back also noted.  Osteopathic findings  T9 extended rotated and side bent left L2 flexed rotated and side bent left L5 flexed rotated and side bent left Sacrum right on right     Assessment and Plan:  Lumbar radiculopathy Significant spasm of the lower back.  Do not believe that it is more radicular at this time.  Seems to be more muscular.  Encourage patient to be more compliant with the medications.  Discussed which activities to do and which ones to avoid.  Increase activity slowly.  Discussed icing regimen and home exercises.  Increase activity slowly.  Follow-up again with me in 6 weeks    Nonallopathic problems  Decision today to treat with OMT was based on Physical Exam  After verbal consent patient was treated with HVLA, ME, FPR techniques in  thoracic, lumbar, and sacral  areas  Patient  tolerated the procedure well with improvement in symptoms  Patient given exercises, stretches and lifestyle modifications  See medications in patient instructions if given  Patient will follow up in 4-8 weeks      The above documentation has been reviewed and is accurate and complete [name], DO        Note: This dictation was prepared with Dragon dictation along with smaller phrase technology. Any transcriptional errors that result from this process are unintentional.

## 2023-04-23 NOTE — Assessment & Plan Note (Addendum)
Significant spasm of the lower back.  Do not believe that it is more radicular at this time.  Seems to be more muscular.  Encourage patient to be more compliant with the medications.  Discussed which activities to do and which ones to avoid.  Increase activity slowly.  Discussed icing regimen and home exercises.  Increase activity slowly.  Follow-up again with me in 6 weeks Toradol and Depo-Medrol injections given today as well.

## 2023-04-23 NOTE — Patient Instructions (Addendum)
Labs today Take mm relaxer at night for next 3 nights Consider starting Cymbalta Dr. Gery Pray for cardiology See me again as scheduled

## 2023-05-01 ENCOUNTER — Other Ambulatory Visit: Payer: Self-pay

## 2023-05-01 ENCOUNTER — Other Ambulatory Visit (INDEPENDENT_AMBULATORY_CARE_PROVIDER_SITE_OTHER): Payer: Medicare Other

## 2023-05-01 DIAGNOSIS — M2550 Pain in unspecified joint: Secondary | ICD-10-CM | POA: Diagnosis not present

## 2023-05-01 LAB — SEDIMENTATION RATE: Sed Rate: 10 mm/h (ref 0–20)

## 2023-05-01 LAB — URINALYSIS
Bilirubin Urine: NEGATIVE
Hgb urine dipstick: NEGATIVE
Ketones, ur: NEGATIVE
Leukocytes,Ua: NEGATIVE
Nitrite: NEGATIVE
Specific Gravity, Urine: 1.025 (ref 1.000–1.030)
Total Protein, Urine: NEGATIVE
Urine Glucose: NEGATIVE
Urobilinogen, UA: 0.2 (ref 0.0–1.0)
pH: 6 (ref 5.0–8.0)

## 2023-05-01 LAB — URIC ACID: Uric Acid, Serum: 6.9 mg/dL (ref 4.0–7.8)

## 2023-05-15 NOTE — Progress Notes (Unsigned)
[name] [address] [place] [address] Phone: [phone] Subjective:   Edwin [name], am serving as a scribe for Dr. Antoine [name].  I'm seeing this patient by the request  of:  Edwin Ivan, MD  CC: Back and neck pain  IRJ:[id number]  Edwin [name] is a 82 y.o. male coming in with complaint of back and neck pain. OMT on 04/23/2023. Patient states that he is doing well.  Patient does have some tightness in the back but nothing unusual.  Medications patient has been prescribed:   Taking:         Reviewed prior external information including notes and imaging from previsou exam, outside providers and external EMR if available.   As well as notes that were available from care everywhere and other healthcare systems.  Past medical history, social, surgical and family history all reviewed in electronic medical record.  No pertanent information unless stated regarding to the chief complaint.   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   right lateral deltoid BASAL CELL CARCINOMA, SUPERFICIAL AND NODULAR PATTERNS   BCC (basal cell carcinoma of skin) 04/23/2017   left lat neck, excision RESIDUAL BASAL CELL CARCINOMA, MARGINS FREE   BCC (basal cell carcinoma of skin) 03/13/2017   left lateral neck  BASAL CELL CARCINOMA, NODULAR PATTERN   BCC (basal cell carcinoma of skin) 12/25/2012   left pretibial   BCC (basal cell carcinoma of skin) 05/21/2007   left medial calf - , 0.8 X 0.5CM: SUPERFICIAL BASAL CELL CARCINOMA, BASE INVOLVED   BPH (benign prostatic hyperplasia)    Cancer (HCC)    Prostate Cancer   Dysrhythmia    SVT   Hyperlipidemia    Hypertension    PTSD (post-traumatic stress disorder)    Pulmonary nodules    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 POSITIVE muscle aches  Objective  Blood pressure 132/82, pulse 70, height 6\' 1"  (1.854 m), SpO2 96%.   General: No apparent distress alert and oriented x3 mood and affect normal, dressed appropriately.  HEENT: Pupils equal, extraocular movements intact  Respiratory: Patient's speak in full sentences and does not appear short of breath  Cardiovascular: No lower extremity edema, non tender, no erythema  Low back does have loss of lordosis noted.  Tightness with FABER test noted. Increasing stiffness noted in the cervical area.  Patient actually lacks the last 5 degrees of flexion noted.   Osteopathic findings C2 flexed rotated and side bent right C4 flexed rotated and side bent left C6 flexed rotated and side bent left T3 extended rotated and side bent right inhaled third rib L2 flexed rotated and side bent right L5 flexed rotated and side bent left Sacrum right on right    Assessment and Plan:  Degenerative disc disease, cervical On exam today  seem to be more worsening neck pain than anything else.  Encouraged patient to try the Cymbalta which she has not tried yet.  Still hopeful that they will consider this.  Increase activity slowly otherwise.  Follow-up with me again in 6 to 8 weeks    Nonallopathic problems  Decision today to treat with OMT was based on Physical Exam  After verbal consent patient was treated with HVLA, ME, FPR techniques in  cervical, rib, thoracic, lumbar, and sacral  areas  Patient tolerated the procedure well with improvement in symptoms  Patient given exercises, stretches and lifestyle modifications  See medications in patient instructions if given  Patient will follow up in 4-8 weeks    The above documentation has been reviewed and is accurate and complete Edwin Saa, DO          Note: This dictation was prepared with Dragon dictation along with smaller phrase technology. Any transcriptional errors that result from this process are unintentional.

## 2023-05-16 ENCOUNTER — Ambulatory Visit: Payer: Medicare Other | Admitting: Family Medicine

## 2023-05-16 ENCOUNTER — Encounter: Payer: Self-pay | Admitting: Family Medicine

## 2023-05-16 VITALS — BP 132/82 | HR 70 | Ht 73.0 in

## 2023-05-16 DIAGNOSIS — M9904 Segmental and somatic dysfunction of sacral region: Secondary | ICD-10-CM

## 2023-05-16 DIAGNOSIS — M9902 Segmental and somatic dysfunction of thoracic region: Secondary | ICD-10-CM | POA: Diagnosis not present

## 2023-05-16 DIAGNOSIS — M9903 Segmental and somatic dysfunction of lumbar region: Secondary | ICD-10-CM

## 2023-05-16 DIAGNOSIS — M5030 Other cervical disc degeneration, unspecified cervical region: Secondary | ICD-10-CM

## 2023-05-16 DIAGNOSIS — M9901 Segmental and somatic dysfunction of cervical region: Secondary | ICD-10-CM | POA: Diagnosis not present

## 2023-05-16 DIAGNOSIS — M9908 Segmental and somatic dysfunction of rib cage: Secondary | ICD-10-CM | POA: Diagnosis not present

## 2023-05-16 NOTE — Assessment & Plan Note (Signed)
On exam today seem to be more worsening neck pain than anything else.  Encouraged patient to try the Cymbalta which she has not tried yet.  Still hopeful that they will consider this.  Increase activity slowly otherwise.  Follow-up with me again in 6 to 8 weeks

## 2023-05-16 NOTE — Patient Instructions (Signed)
See me again in 5-6 weeks 

## 2023-06-12 ENCOUNTER — Encounter: Payer: Self-pay | Admitting: Cardiovascular Disease

## 2023-06-12 ENCOUNTER — Ambulatory Visit (INDEPENDENT_AMBULATORY_CARE_PROVIDER_SITE_OTHER): Payer: Medicare Other

## 2023-06-12 ENCOUNTER — Ambulatory Visit: Payer: Medicare Other | Attending: Cardiovascular Disease | Admitting: Cardiovascular Disease

## 2023-06-12 VITALS — BP 142/80 | HR 94 | Ht 73.0 in | Wt 231.2 lb

## 2023-06-12 DIAGNOSIS — E782 Mixed hyperlipidemia: Secondary | ICD-10-CM

## 2023-06-12 DIAGNOSIS — I10 Essential (primary) hypertension: Secondary | ICD-10-CM | POA: Diagnosis not present

## 2023-06-12 DIAGNOSIS — I2510 Atherosclerotic heart disease of native coronary artery without angina pectoris: Secondary | ICD-10-CM

## 2023-06-12 DIAGNOSIS — I471 Supraventricular tachycardia, unspecified: Secondary | ICD-10-CM

## 2023-06-12 DIAGNOSIS — R002 Palpitations: Secondary | ICD-10-CM

## 2023-06-12 NOTE — Patient Instructions (Signed)
Medication Instructions:  Your physician recommends that you continue on your current medications as directed. Please refer to the Current Medication list given to you today.  *If you need a refill on your cardiac medications before your next appointment, please call your pharmacy*   Testing/Procedures: Your physician has requested that you have an echocardiogram. Echocardiography is a painless test that uses sound waves to create images of your heart. It provides your doctor with information about the size and shape of your heart and how well your heart's chambers and valves are working. This procedure takes approximately one hour. There are no restrictions for this procedure. Please do NOT wear cologne, perfume, aftershave, or lotions (deodorant is allowed). Please arrive 15 minutes prior to your appointment time. This will take place at [address] [place]    Dr. Allyson Sabal has ordered a CT coronary calcium score.   Test locations:  [facility]  [facility]  This is $99 out of pocket.   Coronary CalciumScan A coronary calcium scan is an imaging test used to look for deposits of calcium and other fatty materials (plaques) in the inner lining of the blood vessels of the heart (coronary arteries). These deposits of calcium and plaques can partly clog and narrow the coronary arteries without producing any symptoms or warning signs. This puts a person at risk for a heart attack. This test can detect these deposits before symptoms develop. Tell a health care provider about: Any allergies you have. All medicines you are taking, including vitamins, herbs, eye drops, creams, and over-the-counter medicines. Any problems you or family members have had with anesthetic medicines. Any blood disorders you have. Any surgeries you have had. Any medical conditions you have. Whether you are pregnant or may be  pregnant. What are the risks? Generally, this is a safe procedure. However, problems may occur, including: Harm to a pregnant woman and her unborn baby. This test involves the use of radiation. Radiation exposure can be dangerous to a pregnant woman and her unborn baby. If you are pregnant, you generally should not have this procedure done. Slight increase in the risk of cancer. This is because of the radiation involved in the test. What happens before the procedure? No preparation is needed for this procedure. What happens during the procedure? You will undress and remove any jewelry around your neck or chest. You will put on a hospital gown. Sticky electrodes will be placed on your chest. The electrodes will be connected to an electrocardiogram (ECG) machine to record a tracing of the electrical activity of your heart. A CT scanner will take pictures of your heart. During this time, you will be asked to lie still and hold your breath for 2-3 seconds while a picture of your heart is being taken. The procedure may vary among health care providers and hospitals. What happens after the procedure? You can get dressed. You can return to your normal activities. It is up to you to get the results of your test. Ask your health care provider, or the department that is doing the test, when your results will be ready. Summary A coronary calcium scan is an imaging test used to look for deposits of calcium and other fatty materials (plaques) in the inner lining of the blood vessels of the heart (coronary arteries). Generally, this is a safe procedure. Tell your health care provider if you are pregnant or may be pregnant.  No preparation is needed for this procedure. A CT scanner will take pictures of your heart. You can return to your normal activities after the scan is done. This information is not intended to replace advice given to you by your health care provider. Make sure you discuss any questions  you have with your health care provider. Document Released: 01/05/2008 Document Revised: 05/28/2016 Document Reviewed: 05/28/2016 Elsevier Interactive Patient Education  2017 Elsevier Inc.   Christena Deem- Long Term Monitor Instructions  Your physician has requested you wear a ZIO patch monitor for 14 days.  This is a single patch monitor. [employer] supplies one patch monitor per enrollment. Additional stickers are not available. Please do not apply patch if you will be having a Nuclear Stress Test,  Echocardiogram, Cardiac CT, MRI, or Chest Xray during the period you would be wearing the  monitor. The patch cannot be worn during these tests. You cannot remove and re-apply the  ZIO XT patch monitor.  Your ZIO patch monitor will be mailed 3 day USPS to your address on file. It may take 3-5 days  to receive your monitor after you have been enrolled.  Once you have received your monitor, please review the enclosed instructions. Your monitor  has already been registered assigning a specific monitor serial # to you.  Billing and Patient Assistance Program Information  We have supplied [employer] with any of your insurance information on file for billing purposes. [employer] offers a sliding scale Patient Assistance Program for patients that do not have  insurance, or whose insurance does not completely cover the cost of the ZIO monitor.  You must apply for the Patient Assistance Program to qualify for this discounted rate.  To apply, please call [employer] at [phone], select option 4, select option 2, ask to apply for  Patient Assistance Program. Meredeth Ide will ask your household income, and how many people  are in your household. They will quote your out-of-pocket cost based on that information.  [employer] will also be able to set up a 23-month, interest-free payment plan if needed.  Applying the monitor   Shave hair from upper left chest.  Hold abrader disc by orange tab. Rub abrader in 40 strokes over  the upper left chest as  indicated in your monitor instructions.  Clean area with 4 enclosed alcohol pads. Let dry.  Apply patch as indicated in monitor instructions. Patch will be placed under collarbone on left  side of chest with arrow pointing upward.  Rub patch adhesive wings for 2 minutes. Remove white label marked "1". Remove the white  label marked "2". Rub patch adhesive wings for 2 additional minutes.  While looking in a mirror, press and release button in center of patch. A small green light will  flash 3-4 times. This will be your only indicator that the monitor has been turned on.  Do not shower for the first 24 hours. You may shower after the first 24 hours.  Press the button if you feel a symptom. You will hear a small click. Record Date, Time and  Symptom in the Patient Logbook.  When you are ready to remove the patch, follow instructions on the last 2 pages of Patient  Logbook. Stick patch monitor onto the last page of Patient Logbook.  Place Patient Logbook in the blue and white box. Use locking tab on box and tape box closed  securely. The blue and white box has prepaid postage on it. Please place it in the mailbox as  soon as possible. Your physician should have your test results approximately 7 days after the  monitor has been mailed back to [facility].  Call [facility] Customer Care at [phone] if you have questions regarding  your ZIO XT patch monitor. Call them immediately if you see an orange light blinking on your  monitor.  If your monitor falls off in less than 4 days, contact our Monitor department at [phone].  If your monitor becomes loose or falls off after 4 days call [employer] at [phone] for  suggestions on securing your monitor   Follow-Up: At [facility], you and your health needs are our priority.  As part of our continuing mission to provide you with exceptional heart care, we have created designated Provider Care Teams.   These Care Teams include your primary Cardiologist (physician) and Advanced Practice Providers (APPs -  Physician Assistants and Nurse Practitioners) who all work together to provide you with the care you need, when you need it.  We recommend signing up for the patient portal called "MyChart".  Sign up information is provided on this After Visit Summary.  MyChart is used to connect with patients for Virtual Visits (Telemedicine).  Patients are able to view lab/test results, encounter notes, upcoming appointments, etc.  Non-urgent messages can be sent to your provider as well.   To learn more about what you can do with MyChart, go to [email or web address].    Your next appointment:   6 month(s)  Provider:   Nanetta Batty, MD

## 2023-06-12 NOTE — Assessment & Plan Note (Signed)
History of essential hypertension with blood pressure measured today at 164/80.  He is on valsartan.

## 2023-06-12 NOTE — Assessment & Plan Note (Signed)
Patient notices palpitations when he bends down and then gets up.  I am going to get a 2-week Zio patch to further evaluate.

## 2023-06-12 NOTE — Assessment & Plan Note (Signed)
Patient is a coronary calcification seen on chest CT 05/13/2019 as well as on chest CT PET scan at [facility] performed 10/25/2022  I am going to get a coronary calcium score to further quantify.

## 2023-06-12 NOTE — Progress Notes (Unsigned)
Enrolled patient for a 14 day Zio XT  monitor to be mailed to patients home  °

## 2023-06-12 NOTE — Progress Notes (Signed)
06/12/2023 Edwin [name]   10/28/1940  [id number]  Primary Physician Marisue Ivan, MD Primary Cardiologist: Runell Gess MD FACP, [place], [place], [place]  HPI:  Edwin [name] is a 82 y.o. mild to moderately overweight widowed Caucasian male father of 2 children, grandfather of 2 grandchildren who is accompanied by his significant other Edwin [name] today.  He was a patient of Dr. Arnoldo Hooker at [facility].  He may for cardiovascular valuation because of risk factors.  He works as a Information systems manager.  His risk factors include essential hypertension.  His brother apparently had stents Allyson Sabal is never had an attack or stroke.  He has had prostate cancer evaluated at [facility] as well as a right total hip replacement in September 2023.  He was active until this but currently is not as active as he was.  He denies chest pain or shortness of breath.  He did have a chest CT performed 05/13/2019 that showed coronary calcification with a thoracic aorta measuring 4 cm.  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  His most recent lipid profile performed 01/09/2023 revealed a total cholesterol 168, LDL 92 and HDL 58.   Current Meds  Medication Sig   aspirin EC 81 MG tablet Take 1 tablet (81 mg total) by mouth 2 (two) times daily after a meal. For 2 weeks post op for DVT prevention. Then go back to once a day.   Cyanocobalamin (B-12 PO) Take 1 tablet by mouth daily.   MAGNESIUM GLUCONATE PO Take 400 mg by mouth at bedtime.   Multiple Vitamin (MULTIVITAMIN) tablet Take 1 tablet by mouth daily.   Ubiquinol 50 MG CAPS Take 50 mg by mouth daily.   valsartan (DIOVAN) 80 MG tablet Take 80 mg by mouth 2 (two) times daily.   Vitamin D, Ergocalciferol, (DRISDOL) 1.25 MG (50000 UNIT) CAPS capsule Take 1 capsule (50,000 Units total) by mouth every 7 (seven) days.     Allergies  Allergen Reactions   Amoxicillin-Pot Clavulanate Diarrhea    Bacitracin-Neomycin-Polymyxin Hives   Hydrochlorothiazide    Lipitor [Atorvastatin]     Muscle soreness   Lisinopril Cough   Bacitracin-Polymyxin B Rash   Neosporin [Neomycin-Bacitracin Zn-Polymyx] Rash    Social History   Socioeconomic History   Marital status: Significant Other    Spouse name: Not on file   Number of children: Not on file   Years of education: Not on file   Highest education level: Not on file  Occupational History   Not on file  Tobacco Use   Smoking status: Former    Current packs/day: 0.00    Average packs/day: 0.7 packs/day for 5.0 years (3.5 ttl pk-yrs)    Types: Cigarettes    Start date: 07/23/1960    Quit date: 07/23/1965    Years since quitting: 57.9   Smokeless tobacco: Never  Vaping Use   Vaping status: Never Used  Substance and Sexual Activity   Alcohol use: No   Drug use: No   Sexual activity: Not on file  Other Topics Concern   Not on file  Social History Narrative   Not on file   Social Determinants of Health   Financial Resource Strain: Not on file  Food Insecurity: Not on file  Transportation Needs: Not on file  Physical Activity: Not on file  Stress: Not on file  Social Connections: Not on file  Intimate Partner Violence: Not At Risk (11/20/2021)  Received from [facility], [facility]   Interpersonal Violence    Patient afraid of, threatened, hurt, or sexually abused by someone known to him/her: No     Review of Systems: General: negative for chills, fever, night sweats or weight changes.  Cardiovascular: negative for chest pain, dyspnea on exertion, edema, orthopnea, palpitations, paroxysmal nocturnal dyspnea or shortness of breath Dermatological: negative for rash Respiratory: negative for cough or wheezing Urologic: negative for hematuria Abdominal: negative for nausea, vomiting, diarrhea, bright red blood per rectum, melena, or hematemesis Neurologic: negative for visual changes, syncope, or  dizziness All other systems reviewed and are otherwise negative except as noted above.    Blood pressure (!) 164/80, pulse 94, height 6\' 1"  (1.854 m), weight 231 lb 3.2 oz (104.9 kg), SpO2 98%.  General appearance: alert and no distress Neck: no adenopathy, no carotid bruit, no JVD, supple, symmetrical, trachea midline, and thyroid not enlarged, symmetric, no tenderness/mass/nodules Lungs: clear to auscultation bilaterally Heart: regular rate and rhythm, S1, S2 normal, no murmur, click, rub or gallop Extremities: extremities normal, atraumatic, no cyanosis or edema Pulses: 2+ and symmetric Skin: Skin color, texture, turgor normal. 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  He is on valsartan.  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  Palpitations Patient notices palpitations when he bends down and then gets up.  I am going to get a 2-week Zio patch to further evaluate.     Runell Gess MD FACP,FACC,FAHA, [facility] 06/12/2023 3:38 PM

## 2023-06-12 NOTE — Progress Notes (Unsigned)
[name] [address] [place] [address] Phone: [phone] Subjective:   Edwin [name], am serving as a scribe for Dr. Antoine Primas.  I'm seeing this patient by the request  of:  Marisue Ivan, MD  CC: Neck and back pain  NTI:[id number]  Edwin [name] is a 82 y.o. male coming in with complaint of back and neck pain. OMT on 05/16/2023. Patient states continues to have some discomfort but nothing severe though.  Medications patient has been prescribed:   Taking:         Reviewed prior external information including notes and imaging from previsou exam, outside providers and external EMR if available.   As well as notes that were available from care everywhere and other healthcare systems.  Past medical history, social, surgical and family history all reviewed in electronic medical record.  No pertanent information unless stated regarding to the chief complaint.   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   right lateral deltoid BASAL CELL CARCINOMA, SUPERFICIAL AND NODULAR PATTERNS   BCC (basal cell carcinoma of skin) 04/23/2017   left lat neck, excision RESIDUAL BASAL CELL CARCINOMA, MARGINS FREE   BCC (basal cell carcinoma of skin) 03/13/2017   left lateral neck  BASAL CELL CARCINOMA, NODULAR PATTERN   BCC (basal cell carcinoma of skin) 12/25/2012   left pretibial   BCC (basal cell carcinoma of skin) 05/21/2007   left medial calf - , 0.8 X 0.5CM: SUPERFICIAL BASAL CELL CARCINOMA, BASE INVOLVED   BPH (benign prostatic hyperplasia)    Cancer (HCC)    Prostate Cancer   Dysrhythmia    SVT   Hyperlipidemia    Hypertension    PTSD (post-traumatic stress disorder)    Pulmonary nodules    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   right inferior cheek mandible SQUAMOUS CELL CARCINOMA IN SITU    Allergies  Allergen Reactions    Amoxicillin-Pot Clavulanate Diarrhea   Bacitracin-Neomycin-Polymyxin Hives   Hydrochlorothiazide    Lipitor [Atorvastatin]     Muscle soreness   Lisinopril Cough   Bacitracin-Polymyxin B Rash   Neosporin [Neomycin-Bacitracin Zn-Polymyx] Rash     Review of Systems:  No headache, visual changes, nausea, vomiting, diarrhea, constipation, dizziness, abdominal pain, skin rash, fevers, chills, night sweats, weight loss, swollen lymph nodes, body aches, joint swelling, chest pain, shortness of breath, mood changes. POSITIVE muscle aches  Objective  Blood pressure (!) 124/90, pulse 76, height 6\' 1"  (1.854 m), SpO2 96%.   General: No apparent distress alert and oriented x3 mood and affect normal, dressed appropriately.  HEENT: Pupils equal, extraocular movements intact  Respiratory: Patient's speak in full sentences and does not appear short of breath  Cardiovascular: No lower extremity edema, non tender, no erythema  MSK:  Back significant loss of lordosis noted.  Significant tightness noted.  Significant tightness with Pearlean Brownie right greater than left.  Negative straight leg test noted.  Limited extension of the back noted.  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  Discussed with patient and her necessary to do and  imaging.  Discussed which activities to do and which ones to avoid.  Patient will follow-up with me again in 6 to 8 weeks no change in medication but encouraged him to try the Cymbalta.    Nonallopathic problems  Decision today to treat with OMT was based on Physical Exam  After verbal consent patient was treated with HVLA, ME, FPR techniques in cervical, rib, thoracic, lumbar, and  sacral  areas  Patient tolerated the procedure well with improvement in symptoms  Patient given exercises, stretches and lifestyle modifications  See medications in patient instructions if given  Patient will follow up in 4-8 weeks     The above documentation has been reviewed and is accurate and complete [name], DO         Note: This dictation was prepared with Dragon dictation along with smaller phrase technology. Any transcriptional errors that result from this process are unintentional.

## 2023-06-13 ENCOUNTER — Encounter: Payer: Self-pay | Admitting: Family Medicine

## 2023-06-13 ENCOUNTER — Ambulatory Visit: Payer: Medicare Other | Admitting: Family Medicine

## 2023-06-13 VITALS — BP 124/90 | HR 76 | Ht 73.0 in

## 2023-06-13 DIAGNOSIS — M5416 Radiculopathy, lumbar region: Secondary | ICD-10-CM | POA: Diagnosis not present

## 2023-06-13 DIAGNOSIS — M9901 Segmental and somatic dysfunction of cervical region: Secondary | ICD-10-CM

## 2023-06-13 DIAGNOSIS — M9908 Segmental and somatic dysfunction of rib cage: Secondary | ICD-10-CM

## 2023-06-13 DIAGNOSIS — M9904 Segmental and somatic dysfunction of sacral region: Secondary | ICD-10-CM | POA: Diagnosis not present

## 2023-06-13 DIAGNOSIS — M9903 Segmental and somatic dysfunction of lumbar region: Secondary | ICD-10-CM | POA: Diagnosis not present

## 2023-06-13 DIAGNOSIS — M9902 Segmental and somatic dysfunction of thoracic region: Secondary | ICD-10-CM

## 2023-06-13 NOTE — Patient Instructions (Signed)
Good to see you! Keep my updated on labs See you again in 6-8 weeks

## 2023-06-13 NOTE — Assessment & Plan Note (Signed)
Continue to have chronic problems.  Discussed with patient and her necessary to do and imaging.  Discussed which activities to do and which ones to avoid.  Patient will follow-up with me again in 6 to 8 weeks no change in medication but encouraged him to try the Cymbalta.

## 2023-06-18 DIAGNOSIS — E782 Mixed hyperlipidemia: Secondary | ICD-10-CM | POA: Diagnosis not present

## 2023-06-18 DIAGNOSIS — I471 Supraventricular tachycardia, unspecified: Secondary | ICD-10-CM | POA: Diagnosis not present

## 2023-06-18 DIAGNOSIS — I10 Essential (primary) hypertension: Secondary | ICD-10-CM

## 2023-06-18 DIAGNOSIS — I2510 Atherosclerotic heart disease of native coronary artery without angina pectoris: Secondary | ICD-10-CM | POA: Diagnosis not present

## 2023-06-27 ENCOUNTER — Other Ambulatory Visit: Payer: Self-pay | Admitting: Cardiovascular Disease

## 2023-06-27 ENCOUNTER — Encounter: Payer: Self-pay | Admitting: Family Medicine

## 2023-06-27 DIAGNOSIS — I2510 Atherosclerotic heart disease of native coronary artery without angina pectoris: Secondary | ICD-10-CM

## 2023-06-27 DIAGNOSIS — R002 Palpitations: Secondary | ICD-10-CM

## 2023-06-27 DIAGNOSIS — I471 Supraventricular tachycardia, unspecified: Secondary | ICD-10-CM

## 2023-06-27 DIAGNOSIS — E782 Mixed hyperlipidemia: Secondary | ICD-10-CM

## 2023-06-27 DIAGNOSIS — I10 Essential (primary) hypertension: Secondary | ICD-10-CM

## 2023-07-05 ENCOUNTER — Ambulatory Visit: Payer: Medicare Other | Attending: Cardiovascular Disease

## 2023-07-05 ENCOUNTER — Other Ambulatory Visit: Payer: Medicare Other

## 2023-07-05 DIAGNOSIS — I2510 Atherosclerotic heart disease of native coronary artery without angina pectoris: Secondary | ICD-10-CM | POA: Diagnosis not present

## 2023-07-05 DIAGNOSIS — I471 Supraventricular tachycardia, unspecified: Secondary | ICD-10-CM | POA: Diagnosis not present

## 2023-07-05 LAB — ECHOCARDIOGRAM COMPLETE
AR max vel: 3.57 cm2
AV Area VTI: 3.34 cm2
AV Area mean vel: 3.57 cm2
AV Mean grad: 6.5 mm[Hg]
AV Peak grad: 12.2 mm[Hg]
Ao pk vel: 1.75 m/s
Area-P 1/2: 4.02 cm2
S' Lateral: 4.6 cm
Single Plane A4C EF: 52.7 %

## 2023-07-08 ENCOUNTER — Other Ambulatory Visit: Payer: Self-pay

## 2023-07-08 DIAGNOSIS — I10 Essential (primary) hypertension: Secondary | ICD-10-CM

## 2023-07-08 DIAGNOSIS — I2510 Atherosclerotic heart disease of native coronary artery without angina pectoris: Secondary | ICD-10-CM

## 2023-07-08 DIAGNOSIS — I77810 Thoracic aortic ectasia: Secondary | ICD-10-CM

## 2023-07-10 ENCOUNTER — Encounter: Payer: Self-pay | Admitting: Dermatology

## 2023-07-10 ENCOUNTER — Ambulatory Visit: Payer: Medicare Other | Admitting: Dermatology

## 2023-07-10 DIAGNOSIS — L570 Actinic keratosis: Secondary | ICD-10-CM | POA: Diagnosis not present

## 2023-07-10 DIAGNOSIS — Z79899 Other long term (current) drug therapy: Secondary | ICD-10-CM

## 2023-07-10 DIAGNOSIS — L578 Other skin changes due to chronic exposure to nonionizing radiation: Secondary | ICD-10-CM | POA: Diagnosis not present

## 2023-07-10 DIAGNOSIS — Z7189 Other specified counseling: Secondary | ICD-10-CM

## 2023-07-10 DIAGNOSIS — D229 Melanocytic nevi, unspecified: Secondary | ICD-10-CM

## 2023-07-10 DIAGNOSIS — B353 Tinea pedis: Secondary | ICD-10-CM

## 2023-07-10 DIAGNOSIS — L820 Inflamed seborrheic keratosis: Secondary | ICD-10-CM

## 2023-07-10 DIAGNOSIS — Z1283 Encounter for screening for malignant neoplasm of skin: Secondary | ICD-10-CM

## 2023-07-10 DIAGNOSIS — D1801 Hemangioma of skin and subcutaneous tissue: Secondary | ICD-10-CM

## 2023-07-10 DIAGNOSIS — W908XXA Exposure to other nonionizing radiation, initial encounter: Secondary | ICD-10-CM | POA: Diagnosis not present

## 2023-07-10 DIAGNOSIS — L821 Other seborrheic keratosis: Secondary | ICD-10-CM

## 2023-07-10 DIAGNOSIS — L905 Scar conditions and fibrosis of skin: Secondary | ICD-10-CM

## 2023-07-10 DIAGNOSIS — Z85828 Personal history of other malignant neoplasm of skin: Secondary | ICD-10-CM

## 2023-07-10 DIAGNOSIS — Z5111 Encounter for antineoplastic chemotherapy: Secondary | ICD-10-CM

## 2023-07-10 DIAGNOSIS — L814 Other melanin hyperpigmentation: Secondary | ICD-10-CM

## 2023-07-10 MED ORDER — TERBINAFINE HCL 250 MG PO TABS: 250.0000 mg | ORAL_TABLET | Freq: Every day | ORAL | 0 refills | Status: DC

## 2023-07-10 NOTE — Progress Notes (Deleted)
   Follow-Up Visit   Subjective  Edwin [name] is a 82 y.o. male who presents for the following: 6 month AK follow up. Scalp. Tx with LN2 at last visit, 01/08/2023. Did not use 5FU/Calcipotriene.   The patient has spots, moles and lesions to be evaluated, some may be new or changing and the patient may have concern these could be cancer.    The following portions of the chart were reviewed this encounter and updated as appropriate: medications, allergies, medical history  Review of Systems:  No other skin or systemic complaints except as noted in HPI or Assessment and Plan.  Objective  Well appearing patient in no apparent distress; mood and affect are within normal limits.  A focused examination was performed of the following areas: Face, scalp, ears, arms, hands  Relevant physical exam findings are noted in the Assessment and Plan.    Assessment & Plan      No follow-ups on file.  I, Lawson Radar, CMA, am acting as scribe for Armida Sans, MD.   Documentation: I have reviewed the above documentation for accuracy and completeness, and I agree with the above.  Armida Sans, MD

## 2023-07-10 NOTE — Progress Notes (Signed)
Follow-Up Visit   Subjective  Edwin [name] is a 82 y.o. male who presents for the following: Skin Cancer Screening and Upper Body Skin Exam. Hx of BCCs, Hx of SCCs, Hx of AKs.   6 month AK follow up. Scalp. Tx with LN2 at last visit, 01/08/2023. Did not use 5FU/Calcipotriene.   The patient presents for Upper Body Skin Exam (UBSE) for skin cancer screening and mole check. The patient has spots, moles and lesions to be evaluated, some may be new or changing and the patient may have concern these could be cancer.    The following portions of the chart were reviewed this encounter and updated as appropriate: medications, allergies, medical history  Review of Systems:  No other skin or systemic complaints except as noted in HPI or Assessment and Plan.  Objective  Well appearing patient in no apparent distress; mood and affect are within normal limits.  All skin waist up examined. Relevant physical exam findings are noted in the Assessment and Plan.  xyphoid x3, L mandible, neck and scalp x21 (24) Erythematous keratotic or waxy stuck-on papule or plaque. face, scalp and nose x16 (16) Erythematous thin papules/macules with gritty scale.   Assessment & Plan   INFLAMED SEBORRHEIC KERATOSIS (24) xyphoid x3, L mandible, neck and scalp x21 (24) Symptomatic, irritating, patient would like treated. Destruction of lesion - xyphoid x3, L mandible, neck and scalp x21 (24) Complexity: simple   Destruction method: cryotherapy   Informed consent: discussed and consent obtained   Timeout:  patient name, date of birth, surgical site, and procedure verified Lesion destroyed using liquid nitrogen: Yes   Region frozen until ice ball extended beyond lesion: Yes   Outcome: patient tolerated procedure well with no complications   Post-procedure details: wound care instructions given   Additional details:  Prior to procedure, discussed risks of blister formation, small wound, skin dyspigmentation, or  rare scar following cryotherapy. Recommend Vaseline ointment to treated areas while healing.  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 They are chronic with expected duration over 1 year. A portion of actinic keratoses will progress to squamous cell carcinoma of the skin. It is not possible to reliably predict which spots will progress to skin cancer and so treatment is recommended to prevent development of skin cancer.  Recommend daily broad spectrum sunscreen SPF 30+ to sun-exposed areas, reapply every 2 hours as needed.  Recommend staying in the shade or wearing long sleeves, sun glasses (UVA+UVB protection) and wide brim hats (4-inch brim around the entire circumference of the hat). Call for new or changing lesions. Destruction of lesion - face, scalp and nose x16 (16) Complexity: simple   Destruction method: cryotherapy   Informed consent: discussed and consent obtained   Timeout:  patient name, date of birth, surgical site, and procedure verified Lesion destroyed using liquid nitrogen: Yes   Region frozen until ice ball extended beyond lesion: Yes   Outcome: patient tolerated procedure well with no complications   Post-procedure details: wound care instructions given   Additional details:  Prior to procedure, discussed risks of blister formation, small wound, skin dyspigmentation, or rare scar following cryotherapy. Recommend Vaseline ointment to treated areas while healing.  Skin cancer screening performed today.  ACTINIC DAMAGE WITH PRECANCEROUS ACTINIC KERATOSES Counseling for Topical Chemotherapy Management: Patient exhibits: - Severe, confluent actinic changes with pre-cancerous actinic keratoses that is secondary  to cumulative UV radiation exposure over time - Condition that is severe; chronic, not at goal. - diffuse scaly erythematous  macules and papules with underlying dyspigmentation - Discussed Prescription "Field Treatment" topical Chemotherapy for Severe, Chronic Confluent Actinic Changes with Pre-Cancerous Actinic Keratoses Field treatment involves treatment of an entire area of skin that has confluent Actinic Changes (Sun/ Ultraviolet light damage) and PreCancerous Actinic Keratoses by method of PhotoDynamic Therapy (PDT) and/or prescription Topical Chemotherapy agents such as 5-fluorouracil, 5-fluorouracil/calcipotriene, and/or imiquimod.  The purpose is to decrease the number of clinically evident and subclinical PreCancerous lesions to prevent progression to development of skin cancer by chemically destroying early precancer changes that may or may not be visible.  It has been shown to reduce the risk of developing skin cancer in the treated area. As a result of treatment, redness, scaling, crusting, and open sores may occur during treatment course. One or more than one of these methods may be used and may have to be used several times to control, suppress and eliminate the PreCancerous changes. Discussed treatment course, expected reaction, and possible side effects. - Recommend daily broad spectrum sunscreen SPF 30+ to sun-exposed areas, reapply every 2 hours as needed.  - Staying in the shade or wearing long sleeves, sun glasses (UVA+UVB protection) and wide brim hats (4-inch brim around the entire circumference of the hat) are also recommended. - Call for new or changing lesions.  -In late January Start 5-fluorouracil/calcipotriene cream twice a day for 10 days to affected areas including scalp. Prescription sent to [facility]. Patient advised they will receive an email to purchase the medication online and have it sent to their home. Patient provided with handout reviewing treatment course and side effects and advised to call or message [place] on MyChart with any concerns.   Reviewed course of  treatment and expected reaction.  Patient advised to expect inflammation and crusting and advised that erosions are possible.  Patient advised to be diligent with sun protection during and after treatment. Counseled to keep medication out of reach of children and pets.   SCAR, Bx proven hyperplastic AK. Exam: Dyspigmented smooth macule or patch at upper lip, left of mid line. Benign-appearing.  Observation.  Call clinic for new or changing lesions. Recommend daily broad spectrum sunscreen SPF 30+, reapply every 2 hours as needed. Treatment: Recommend Serica moisturizing scar formula cream every night or [facility] brand or Mederma silicone scar sheet every night for the first year after a scar appears to help with scar remodeling if desired. Scars remodel on their own for a full year and will gradually improve in appearance over time.    Lentigines, Seborrheic Keratoses, Hemangiomas - Benign normal skin lesions - Benign-appearing - Call for any changes  Melanocytic Nevi - Tan-brown and/or pink-flesh-colored symmetric macules and papules - Benign appearing on exam today - Observation - Call clinic for new or changing moles - Recommend daily use of broad spectrum spf 30+ sunscreen to sun-exposed areas.   TINEA PEDIS Exam: Scaling and maceration web spaces and over distal and lateral soles and ankle of left foot.  Treatment Plan: Start Terbinafine 250 mg once daily for one month.  Terbinafine Counseling  Terbinafine is an anti-fungal medicine that can be applied to the skin (over the counter) or taken by mouth (prescription) to treat fungal infections. The pill version is often used to treat fungal infections of the nails or scalp. While most people do not have any side effects from taking terbinafine pills,  some possible side effects of the medicine can include taste changes, headache, loss of smell, vision changes, nausea, vomiting, or diarrhea.   Rare side effects can include irritation  of the liver, allergic reaction, or decrease in blood counts (which may show up as not feeling well or developing an infection). If you are concerned about any of these side effects, please stop the medicine and call your doctor, or in the case of an emergency such as feeling very unwell, seek immediate medical care.      Return in about 6 months (around 01/08/2024) for AK Follow Up, UBSE.  I, Lawson Radar, CMA, am acting as scribe for Armida Sans, MD.   Documentation: I have reviewed the above documentation for accuracy and completeness, and I agree with the above.  Armida Sans, MD

## 2023-07-10 NOTE — Patient Instructions (Addendum)
Cryotherapy Aftercare  Wash gently with soap and water everyday.   Apply Vaseline Jelly daily until healed.    -In late January Start 5-fluorouracil/calcipotriene cream twice a day for 10 days to affected areas including scalp. Prescription sent to [facility]. Patient advised they will receive an email to purchase the medication online and have it sent to their home. Patient provided with handout reviewing treatment course and side effects and advised to call or message [place] on MyChart with any concerns.   Reviewed course of treatment and expected reaction.  Patient advised to expect inflammation and crusting and advised that erosions are possible.  Patient advised to be diligent with sun protection during and after treatment. Counseled to keep medication out of reach of children and pets.     Recommend daily broad spectrum sunscreen SPF 30+ to sun-exposed areas, reapply every 2 hours as needed. Call for new or changing lesions.  Staying in the shade or wearing long sleeves, sun glasses (UVA+UVB protection) and wide brim hats (4-inch brim around the entire circumference of the hat) are also recommended for sun protection.      Melanoma ABCDEs  Melanoma is the most dangerous type of skin cancer, and is the leading cause of death from skin disease.  You are more likely to develop melanoma if you: Have light-colored skin, light-colored eyes, or red or blond hair Spend a lot of time in the sun Tan regularly, either outdoors or in a tanning bed Have had blistering sunburns, especially during childhood Have a close family member who has had a melanoma Have atypical moles or large birthmarks  Early detection of melanoma is key since treatment is typically straightforward and cure rates are extremely high if we catch it early.   The first sign of melanoma is often a change in a mole or a new dark spot.  The ABCDE system is a way of remembering the signs of melanoma.  A  for asymmetry:  The two halves do not match. B for border:  The edges of the growth are irregular. C for color:  A mixture of colors are present instead of an even brown color. D for diameter:  Melanomas are usually (but not always) greater than 6mm - the size of a pencil eraser. E for evolution:  The spot keeps changing in size, shape, and color.  Please check your skin once per month between visits. You can use a small mirror in front and a large mirror behind you to keep an eye on the back side or your body.   If you see any new or changing lesions before your next follow-up, please call to schedule a visit.  Please continue daily skin protection including broad spectrum sunscreen SPF 30+ to sun-exposed areas, reapplying every 2 hours as needed when you're outdoors.   Staying in the shade or wearing long sleeves, sun glasses (UVA+UVB protection) and wide brim hats (4-inch brim around the entire circumference of the hat) are also recommended for sun protection.      Due to recent changes in healthcare laws, you may see results of your pathology and/or laboratory studies on MyChart before the doctors have had a chance to review them. We understand that in some cases there may be results that are confusing or concerning to you. Please understand that not all results are received at the same time and often the doctors may need to interpret multiple results in order to provide you with the best plan of  care or course of treatment. Therefore, we ask that you please give [place] 2 business days to thoroughly review all your results before contacting the office for clarification. Should we see a critical lab result, you will be contacted sooner.   If You Need Anything After Your Visit  If you have any questions or concerns for your doctor, please call our main line at [phone] and press option 4 to reach your doctor's medical assistant. If no one answers, please leave a voicemail as directed and we  will return your call as soon as possible. Messages left after 4 pm will be answered the following business day.   You may also send [place] a message via MyChart. We typically respond to MyChart messages within 1-2 business days.  For prescription refills, please ask your pharmacy to contact our office. Our fax number is [phone].  If you have an urgent issue when the clinic is closed that cannot wait until the next business day, you can page your doctor at the number below.    Please note that while we do our best to be available for urgent issues outside of office hours, we are not available 24/7.   If you have an urgent issue and are unable to reach [place], you may choose to seek medical care at your doctor's office, retail clinic, urgent care center, or emergency room.  If you have a medical emergency, please immediately call 911 or go to the emergency department.  Pager Numbers  - Dr. Gwen Pounds: [phone]  - Dr. Roseanne Reno: [phone]  - Dr. Katrinka Blazing: [phone]   In the event of inclement weather, please call our main line at [phone] for an update on the status of any delays or closures.  Dermatology Medication Tips: Please keep the boxes that topical medications come in in order to help keep track of the instructions about where and how to use these. Pharmacies typically print the medication instructions only on the boxes and not directly on the medication tubes.   If your medication is too expensive, please contact our office at [phone] option 4 or send [place] a message through MyChart.   We are unable to tell what your co-pay for medications will be in advance as this is different depending on your insurance coverage. However, we may be able to find a substitute medication at lower cost or fill out paperwork to get insurance to cover a needed medication.   If a prior authorization is required to get your medication covered by your insurance company, please allow [place] 1-2  business days to complete this process.  Drug prices often vary depending on where the prescription is filled and some pharmacies may offer cheaper prices.  The website www.goodrx.com contains coupons for medications through different pharmacies. The prices here do not account for what the cost may be with help from insurance (it may be cheaper with your insurance), but the website can give you the price if you did not use any insurance.  - You can print the associated coupon and take it with your prescription to the pharmacy.  - You may also stop by our office during regular business hours and pick up a GoodRx coupon card.  - If you need your prescription sent electronically to a different pharmacy, notify our office through [facility] or by phone at [phone] option 4.     Si Usted Necesita Algo Despus de Su Visita  Tambin puede enviarnos un mensaje a travs de Clinical cytogeneticist. Por lo  general respondemos a los mensajes de MyChart en el transcurso de 1 a 2 das hbiles.  Para renovar recetas, por favor pida a su farmacia que se ponga en contacto con nuestra oficina. Annie Sable de fax es [place] [phone].  Si tiene un asunto urgente cuando la clnica est cerrada y que no puede esperar hasta el siguiente da hbil, puede llamar/localizar a su doctor(a) al nmero que aparece a continuacin.   Por favor, tenga en cuenta que aunque hacemos todo lo posible para estar disponibles para asuntos urgentes fuera del horario de [place], no estamos disponibles las 24 horas del da, los 7 [address]  [address] de la [place].   Si tiene un problema urgente y no puede comunicarse con nosotros, puede optar por buscar atencin mdica  en el consultorio de su doctor(a), en una clnica privada, en un centro de atencin urgente o en una sala de emergencias.  Si tiene Engineer, drilling, por favor llame inmediatamente al 911 o vaya a la sala de emergencias.  Nmeros de bper  - Dr. Gwen Pounds: [phone]  - Dra.  Roseanne Reno: [phone]  - Dr. Katrinka Blazing: [phone]   En caso de inclemencias del tiempo, por favor llame a Lacy Duverney principal al [phone] para una actualizacin sobre el [place] de cualquier retraso o cierre.  Consejos para la medicacin en dermatologa: Por favor, guarde las cajas en las que vienen los medicamentos de uso tpico para ayudarle a seguir las instrucciones sobre dnde y cmo usarlos. Las farmacias generalmente imprimen las instrucciones del medicamento slo en las cajas y no directamente en los tubos del [place].   Si su medicamento es muy caro, por favor, pngase en contacto con Rolm Gala llamando al [phone] y presione la opcin 4 o envenos un mensaje a travs de Clinical cytogeneticist.   No podemos decirle cul ser su copago por los medicamentos por adelantado ya que esto es diferente dependiendo de la cobertura de su seguro. Sin embargo, es posible que podamos encontrar un medicamento sustituto a Audiological scientist un formulario para que el seguro cubra el medicamento que se considera necesario.   Si se requiere una autorizacin previa para que su compaa de seguros [place] su medicamento, por favor permtanos de 1 a 2 das hbiles para completar [address]  Los precios de los medicamentos varan con frecuencia dependiendo del Environmental consultant de dnde se surte la receta y alguna farmacias pueden ofrecer precios ms baratos.  El sitio web www.goodrx.com tiene cupones para medicamentos de Health and safety inspector. Los precios aqu no tienen en cuenta lo que podra costar con la ayuda del seguro (puede ser ms barato con su seguro), pero el sitio web puede darle el precio si no utiliz Tourist information centre manager.  - Puede imprimir el cupn correspondiente y llevarlo con su receta a la farmacia.  - Tambin puede pasar por nuestra oficina durante el horario de atencin regular y Education officer, museum una tarjeta de cupones de GoodRx.  - Si necesita que su receta se enve electrnicamente a una farmacia diferente,  informe a nuestra oficina a travs de MyChart de [place] o por telfono llamando al [phone] y presione la opcin 4.

## 2023-07-12 NOTE — Progress Notes (Signed)
[name] [address] [place] [address] Phone: [phone] Subjective:   Edwin [name], am serving as a scribe for Dr. Antoine Primas.  I'm seeing this patient by the request  of:  Marisue Ivan, MD  CC: Back and neck pain follow-up  [id number]  Edwin [name] is a 82 y.o. male coming in with complaint of back and neck pain. OMT on 06/13/2023. Patient states same per usual. No new concerns.  Medications patient has been prescribed:   Taking:         Reviewed prior external information including notes and imaging from previsou exam, outside providers and external EMR if available.   As well as notes that were available from care everywhere and other healthcare systems.  Past medical history, social, surgical and family history all reviewed in electronic medical record.  No pertanent information unless stated regarding to the chief complaint.   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   right lateral deltoid BASAL CELL CARCINOMA, SUPERFICIAL AND NODULAR PATTERNS   BCC (basal cell carcinoma of skin) 04/23/2017   left lat neck, excision RESIDUAL BASAL CELL CARCINOMA, MARGINS FREE   BCC (basal cell carcinoma of skin) 03/13/2017   left lateral neck  BASAL CELL CARCINOMA, NODULAR PATTERN   BCC (basal cell carcinoma of skin) 12/25/2012   left pretibial   BCC (basal cell carcinoma of skin) 05/21/2007   left medial calf - , 0.8 X 0.5CM: SUPERFICIAL BASAL CELL CARCINOMA, BASE INVOLVED   BPH (benign prostatic hyperplasia)    Cancer (HCC)    Prostate Cancer   Dysrhythmia    SVT   Hyperlipidemia    Hypertension    PTSD (post-traumatic stress disorder)    Pulmonary nodules    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 POSITIVE muscle aches  Objective  Blood pressure 122/76, pulse 79, height 6\' 1"  (1.854 m), SpO2 95%.   General: No apparent distress alert and oriented x3 mood and affect normal, dressed appropriately.  HEENT: Pupils equal, extraocular movements intact  Respiratory: Patient's speak in full sentences and does not appear short of breath  Cardiovascular: No lower extremity edema, non tender, no erythema  Gait MSK:  Back does have some loss lordosis noted.  Some tenderness to palpation in the paraspinal musculature.  Some loss of lordosis noted.  Osteopathic findings  C2 flexed rotated and side bent right C6 flexed rotated and side bent left T3 extended rotated and side bent right inhaled rib T9 extended rotated and side bent left L2 flexed rotated and side bent right L3 flexed rotated left L5 flexed rotated and side bent left. Sacrum right on right       Assessment and Plan:  Lumbar radiculopathy Stable overall.  Some tightness noted.  Does feel that some of the discomfort and pain  that he gets from time to time is secondary to some anxiety.  Given the very low dose of Xanax and encouraged him to even use half a pill when necessary.  Can consider a muscle relaxer as well but only to take at night.  Would not want to do this long-term and use very sparingly.  Patient is going to follow-up for his SVT as well with his cardiologist in the near future.  Follow-up with me again in 6 to 8  weeks  Supraventricular tachycardia (HCC) Following up with Dr. Allyson Sabal today  Malignant neoplasm of prostate [facility]) Continue to have downtrending PSA from .7, up to .65, up to .60 recently.    Nonallopathic problems  Decision today to treat with OMT was based on Physical Exam  After verbal consent patient was treated with HVLA, ME, FPR techniques in cervical, rib, thoracic, lumbar, and sacral  areas  Patient tolerated the procedure well with improvement in symptoms  Patient given exercises, stretches and lifestyle modifications  See medications in patient instructions if given  Patient will follow up in 4-8 weeks    The above documentation has been reviewed and is accurate and complete [name], DO          Note: This dictation was prepared with Dragon dictation along with smaller phrase technology. Any transcriptional errors that result from this process are unintentional.

## 2023-07-19 ENCOUNTER — Encounter: Payer: Self-pay | Admitting: Cardiovascular Disease

## 2023-07-19 ENCOUNTER — Ambulatory Visit: Payer: Medicare Other | Admitting: Family Medicine

## 2023-07-19 ENCOUNTER — Encounter: Payer: Self-pay | Admitting: Family Medicine

## 2023-07-19 ENCOUNTER — Ambulatory Visit: Payer: Medicare Other | Attending: Cardiovascular Disease | Admitting: Cardiovascular Disease

## 2023-07-19 VITALS — BP 122/76 | HR 79 | Ht 73.0 in

## 2023-07-19 VITALS — BP 148/84 | HR 83 | Ht 73.0 in | Wt 230.6 lb

## 2023-07-19 DIAGNOSIS — M9901 Segmental and somatic dysfunction of cervical region: Secondary | ICD-10-CM

## 2023-07-19 DIAGNOSIS — I10 Essential (primary) hypertension: Secondary | ICD-10-CM | POA: Diagnosis not present

## 2023-07-19 DIAGNOSIS — I712 Thoracic aortic aneurysm, without rupture, unspecified: Secondary | ICD-10-CM | POA: Insufficient documentation

## 2023-07-19 DIAGNOSIS — M9908 Segmental and somatic dysfunction of rib cage: Secondary | ICD-10-CM | POA: Diagnosis not present

## 2023-07-19 DIAGNOSIS — M9902 Segmental and somatic dysfunction of thoracic region: Secondary | ICD-10-CM

## 2023-07-19 DIAGNOSIS — M5416 Radiculopathy, lumbar region: Secondary | ICD-10-CM | POA: Diagnosis not present

## 2023-07-19 DIAGNOSIS — E782 Mixed hyperlipidemia: Secondary | ICD-10-CM

## 2023-07-19 DIAGNOSIS — I471 Supraventricular tachycardia, unspecified: Secondary | ICD-10-CM | POA: Diagnosis not present

## 2023-07-19 DIAGNOSIS — R002 Palpitations: Secondary | ICD-10-CM | POA: Diagnosis not present

## 2023-07-19 DIAGNOSIS — C61 Malignant neoplasm of prostate: Secondary | ICD-10-CM

## 2023-07-19 DIAGNOSIS — I2510 Atherosclerotic heart disease of native coronary artery without angina pectoris: Secondary | ICD-10-CM | POA: Diagnosis not present

## 2023-07-19 DIAGNOSIS — I7121 Aneurysm of the ascending aorta, without rupture: Secondary | ICD-10-CM

## 2023-07-19 DIAGNOSIS — M9903 Segmental and somatic dysfunction of lumbar region: Secondary | ICD-10-CM

## 2023-07-19 DIAGNOSIS — M9904 Segmental and somatic dysfunction of sacral region: Secondary | ICD-10-CM

## 2023-07-19 MED ORDER — ALPRAZOLAM 1 MG PO TABS: 1.0000 mg | ORAL_TABLET | Freq: Every evening | ORAL | 0 refills | Status: AC | PRN

## 2023-07-19 NOTE — Assessment & Plan Note (Signed)
Stable overall.  Some tightness noted.  Does feel that some of the discomfort and pain that he gets from time to time is secondary to some anxiety.  Given the very low dose of Xanax and encouraged him to even use half a pill when necessary.  Can consider a muscle relaxer as well but only to take at night.  Would not want to do this long-term and use very sparingly.  Patient is going to follow-up for his SVT as well with his cardiologist in the near future.  Follow-up with me again in 6 to 8 weeks

## 2023-07-19 NOTE — Progress Notes (Signed)
Mr. Thiesse returns today for follow-up of his recent test.  He is accompanied by his significant other Raynelle Fanning today.  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  This will be repeated on an annual basis.  His event monitor did show 44 episodes of SVT with the longest duration of 6 hours.  He is minimally symptomatic.  I am referring him to EP for further evaluation.  Will recheck a 2D echo and 12 months and I will see back after that.  We did talk about getting a coronary calcium score which he has deferred for the time being.  Runell Gess, M.D., FACP, [facility], [facility] [address] [place], [place]  [address]  [phone] 07/19/2023 3:09 PM

## 2023-07-19 NOTE — Assessment & Plan Note (Signed)
History of hypertension blood pressure measured today at 148/84.  He is on valsartan.

## 2023-07-19 NOTE — Assessment & Plan Note (Signed)
2D echo performed 07/05/2023 revealed normal LV systolic function, grade 1 diastolic dysfunction and an ascending thoracic aorta measuring 45 mm.  This will be repeated on an annual basis.

## 2023-07-19 NOTE — Patient Instructions (Signed)
 Medication Instructions:  Your physician recommends that you continue on your current medications as directed. Please refer to the Current Medication list given to you today.  *If you need a refill on your cardiac medications before your next appointment, please call your pharmacy*   Testing/Procedures: Your physician has requested that you have an echocardiogram. Echocardiography is a painless test that uses sound waves to create images of your heart. It provides your doctor with information about the size and shape of your heart and how well your heart's chambers and valves are working. This procedure takes approximately one hour. There are no restrictions for this procedure. Please do NOT wear cologne, perfume, aftershave, or lotions (deodorant is allowed). Please arrive 15 minutes prior to your appointment time. **To do in December 2025**  Please note: We ask at that you not bring children with you during ultrasound (echo/ vascular) testing. Due to room size and safety concerns, children are not allowed in the ultrasound rooms during exams. Our front office staff cannot provide observation of children in our lobby area while testing is being conducted. An adult accompanying a patient to their appointment will only be allowed in the ultrasound room at the discretion of the ultrasound technician under special circumstances. We apologize for any inconvenience.    Follow-Up: At [facility], you and your health needs are our priority.  As part of our continuing mission to provide you with exceptional heart care, we have created designated Provider Care Teams.  These Care Teams include your primary Cardiologist (physician) and Advanced Practice Providers (APPs -  Physician Assistants and Nurse Practitioners) who all work together to provide you with the care you need, when you need it.  We recommend signing up for the patient portal called "MyChart".  Sign up information is provided on this  After Visit Summary.  MyChart is used to connect with patients for Virtual Visits (Telemedicine).  Patients are able to view lab/test results, encounter notes, upcoming appointments, etc.  Non-urgent messages can be sent to your provider as well.   To learn more about what you can do with MyChart, go to [email or web address].    Your next appointment:   12 month(s)  Provider:   Nanetta Batty, MD

## 2023-07-19 NOTE — Assessment & Plan Note (Signed)
History of coronary calcium get occasional CT scan.  I had ordered a coronary calcium score which is yet to be performed.

## 2023-07-19 NOTE — Assessment & Plan Note (Signed)
Following up with Dr. Allyson Sabal today

## 2023-07-19 NOTE — Assessment & Plan Note (Signed)
Continue to have downtrending PSA from .7, up to .65, up to .60 recently.

## 2023-07-19 NOTE — Assessment & Plan Note (Signed)
Edwin [name] had an event monitor performed 07/11/2023 that revealed 44 episodes of SVT with durations up to 6 hours.  He is minimally symptomatic.  I am referring him to EP for further evaluation.

## 2023-07-19 NOTE — Assessment & Plan Note (Addendum)
History of hyperlipidemia not on statin therapy with lipid profile performed 06/26/23 revealing a total cholesterol of 157, LDL of 91 and HDL of 52.

## 2023-07-20 ENCOUNTER — Encounter: Payer: Self-pay | Admitting: Dermatology

## 2023-08-06 ENCOUNTER — Institutional Professional Consult (permissible substitution): Payer: Medicare Other | Admitting: Cardiology

## 2023-08-13 ENCOUNTER — Encounter: Payer: Self-pay | Admitting: Cardiology

## 2023-08-13 ENCOUNTER — Ambulatory Visit: Payer: Medicare Other | Attending: Cardiology | Admitting: Cardiology

## 2023-08-13 VITALS — BP 160/82 | HR 89 | Ht 73.0 in | Wt 233.2 lb

## 2023-08-13 DIAGNOSIS — I440 Atrioventricular block, first degree: Secondary | ICD-10-CM

## 2023-08-13 DIAGNOSIS — I445 Left posterior fascicular block: Secondary | ICD-10-CM | POA: Diagnosis not present

## 2023-08-13 DIAGNOSIS — I4510 Unspecified right bundle-branch block: Secondary | ICD-10-CM | POA: Diagnosis not present

## 2023-08-13 DIAGNOSIS — I471 Supraventricular tachycardia, unspecified: Secondary | ICD-10-CM

## 2023-08-13 DIAGNOSIS — I10 Essential (primary) hypertension: Secondary | ICD-10-CM

## 2023-08-13 MED ORDER — METOPROLOL TARTRATE 25 MG PO TABS: 25.0000 mg | ORAL_TABLET | Freq: Every day | ORAL | 3 refills | Status: DC | PRN

## 2023-08-13 NOTE — Patient Instructions (Signed)
Medication Instructions:  Your physician has recommended you make the following change in your medication:  1) START taking metoprolol tartrate (Lopressor) 25 mg once daily as needed  *If you need a refill on your cardiac medications before your next appointment, please call your pharmacy*  Follow-Up: At [facility], you and your health needs are our priority.  As part of our continuing mission to provide you with exceptional heart care, we have created designated Provider Care Teams.  These Care Teams include your primary Cardiologist (physician) and Advanced Practice Providers (APPs -  Physician Assistants and Nurse Practitioners) who all work together to provide you with the care you need, when you need it.  Your next appointment:   6 months  Provider:   You may see Nobie Putnam, MD or one of the following Advanced Practice Providers on your designated Care Team:   Francis Dowse, [place] [address]" [place], [place] Sherie Don, NP Canary Brim, NP

## 2023-08-13 NOTE — Progress Notes (Signed)
Electrophysiology Office Note:   Date:  08/13/2023  ID:  OLLIS [name], DOB January 18, 1941, MRN [id number]  Primary Cardiologist: None Primary Heart Failure: None Electrophysiologist: Nobie Putnam, MD      History of Present Illness:   Edwin [name] is a 83 y.o. male with h/o hypertension, coronary calcifications on CT who is being seen today for evaluation for SVT at the request of Dr. Allyson Sabal.  For past 10-15 years, has had intermittent episodes of palpitations. These were occurring more notably when bending over and standing up, like when putting on shoes. He voiced this complaint to Dr. Allyson Sabal and [facility] monitor was ordered. This showed frequent episodes of SVT, longest lasting 6 hours.  He denies any episodes since completing his Zio monitor. He has been very active, at times cycling 15 miles per day, 6 days per week. He denies any dizziness, lightheadedness, chest pain or syncope. He is otherwise doing well and has no new or acute complaints.   Review of systems complete and found to be negative unless listed in HPI.   EP Information / Studies Reviewed:    EKG is not ordered today. EKG from 06/11/24 reviewed which showed sinus rhythm with first degree AV delay, RBBB and LPFB.      Zio Monitor 07/08/23   Patient had a min HR of 43 bpm, max HR of 174 bpm, and avg HR of 67 bpm. Predominant underlying rhythm was Sinus Rhythm. First Degree AV Block was present. 44 Supraventricular Tachycardia runs occurred, the run with the fastest interval lasting 1 hour 9  mins with a max rate of 174 bpm, the longest lasting 6 hours 30 mins with an avg rate of 149 bpm. Supraventricular Tachycardia was detected within +/- 45 seconds of symptomatic patient event(s). Isolated SVEs were rare (<1.0%), SVE Couplets were rare  (<1.0%), and SVE Triplets were rare (<1.0%). Isolated VEs were rare (<1.0%), and no VE Couplets or VE Triplets were present.   SR/SB/ST Occasional PACs/PVCs 44 episodes of SVT with durations  up to 6 hours Needs ROV with me or an APP to discuss        Physical Exam:   VS:  BP (!) 160/82 (BP Location: Left Arm, Patient Position: Sitting)   Pulse 89   Ht 6\' 1"  (1.854 m)   Wt 233 lb 3.2 oz (105.8 kg)   SpO2 98%   BMI 30.77 kg/m    Wt Readings from Last 3 Encounters:  08/13/23 233 lb 3.2 oz (105.8 kg)  07/19/23 230 lb 9.6 oz (104.6 kg)  06/12/23 231 lb 3.2 oz (104.9 kg)     GEN: Well nourished, well developed in no acute distress NECK: No JVD CARDIAC: Normal rate and regular rhythm RESPIRATORY:  Clear to auscultation without rales, wheezing or rhonchi  ABDOMEN: Soft, non-tender, non-distended EXTREMITIES:  No edema; No deformity   ASSESSMENT AND PLAN:   [name] is a 83 y.o. male with h/o hypertension, coronary calcifications on CT who is being seen today for evaluation for SVT at the request of Dr. Allyson Sabal.  #. SVT: Numerous episodes on a recent Zio monitor, lasting up to 6 hours. These appear to be sudden onset/offset. Based on Zio there is no irregularity or flutter waves. 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  - Discussed EP study and ablation and patient is not interested at this time.  - Will not place on any scheduled beta-blockers or calcium channel blockers at this time due to presence of first degree AV delay,  RBBB, LPFB. He can take a metoprolol tartrate 25mg  on an as needed basis for sustained SVT. - Echocardiogram is ordered and is pending.   #. 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   #. Hypertension -Above goal today.  Recommend checking blood pressures 1-2 times per week at home and recording the values.  Recommend bringing these recordings to the primary care physician.  Follow up with Dr. Jimmey Ralph in 3 months  Signed, Nobie Putnam, MD

## 2023-08-21 NOTE — Progress Notes (Unsigned)
[name] [address] [place] [address] Phone: [phone] Subjective:   [name], am serving as a scribe for Dr. Antoine Primas.  I'm seeing this patient by the request  of:  Marisue Ivan, MD  CC: Back and neck pain  [id number]  Edwin [name] is a 83 y.o. male coming in with complaint of back and neck pain. OMT on 07/19/2023. Patient states that he is tight due to stress.  Has been doing a lot of work recently.  Also significant other has had a lot of stress with her job.  Medications patient has been prescribed: Xanax  Taking:      Patient was seen by cardiology recently and has had numerous runs of SVT on long-term monitoring.  They did discuss the possibility of an EP study and ablation.  Patient was given a prescription for Metroprolol tartrate 25 mg to use as needed.  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   As well as notes that were available from care everywhere and other healthcare systems.  Past medical history, social, surgical and family history all reviewed in electronic medical record.  No pertanent information unless stated regarding to the chief complaint.   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   right lateral deltoid BASAL CELL CARCINOMA, SUPERFICIAL AND NODULAR PATTERNS   BCC (basal cell carcinoma of skin) 04/23/2017   left lat neck, excision RESIDUAL BASAL CELL CARCINOMA, MARGINS FREE   BCC (basal cell carcinoma of skin) 03/13/2017   left lateral neck  BASAL CELL CARCINOMA, NODULAR PATTERN   BCC (basal cell carcinoma of skin) 12/25/2012   left pretibial   BCC (basal cell  carcinoma of skin) 05/21/2007   left medial calf - , 0.8 X 0.5CM: SUPERFICIAL BASAL CELL CARCINOMA, BASE INVOLVED   BPH (benign prostatic hyperplasia)    Cancer (HCC)    Prostate Cancer   Dysrhythmia    SVT   Hyperlipidemia    Hypertension    PTSD (post-traumatic stress disorder)    Pulmonary nodules    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   right inferior cheek mandible SQUAMOUS CELL CARCINOMA IN SITU    Allergies  Allergen Reactions   Amoxicillin-Pot Clavulanate Diarrhea   Bacitracin-Neomycin-Polymyxin Hives   Hydrochlorothiazide    Lipitor [Atorvastatin]     Muscle soreness   Lisinopril Cough   Bacitracin-Polymyxin B Rash   Neosporin [Neomycin-Bacitracin Zn-Polymyx] Rash     Review of Systems:  No headache, visual changes, nausea, vomiting, diarrhea, constipation, dizziness, abdominal pain, skin rash, fevers, chills, night sweats, weight loss, swollen lymph nodes, body aches, joint swelling, chest pain, shortness of breath, mood changes. POSITIVE muscle aches  Objective  Blood pressure 120/78, pulse 73, height 6\' 1"  (1.854 m), SpO2 96%.   General: No apparent distress alert and oriented x3 mood and affect normal, dressed appropriately.  HEENT: Pupils equal, extraocular movements intact  Respiratory: Patient's speak in full sentences and does not appear short of breath  Cardiovascular: No lower extremity edema, non tender, no erythema  MSK:  Back does have some loss of lordosis that is fairly significant with only 5 degrees of extension.  Tightness with FABER test bilaterally.  No difficulty even with range of motion of the lumbar spine.  Osteopathic findings  C2 flexed rotated and side bent right C7 flexed rotated and side bent left T3 extended rotated and side bent right inhaled rib T8 extended rotated and side bent left L2 flexed rotated and side bent right L4 flexed rotated and side bent left Sacrum right on right       Assessment and Plan:  Lumbar  radiculopathy Continue to work hard.  Patient's SVT seems to be stable.  Can work out pretty much as hard as he can at this moment.  Does respond well to osteopathic manipulation.  No significant findings that make me significantly concerned for any long-term difficulties hopefully at this time.  Known for severe arthritic changes.  If anything else such as fevers, chills, abnormal weight loss we will consider further workup again of the prostate but at the moment do think patient is in a good place.  Follow-up again in 6 to 8 weeks    Nonallopathic problems  Decision today to treat with OMT was based on Physical Exam  After verbal consent patient was treated with  ME, FPR techniques in cervical, rib, thoracic, lumbar, and sacral  areas  Patient tolerated the procedure well with improvement in symptoms  Patient given exercises, stretches and lifestyle modifications  See medications in patient instructions if given  Patient will follow up in 4-8 weeks     The above documentation has been reviewed and is accurate and complete [name], DO         Note: This dictation was prepared with Dragon dictation along with smaller phrase technology. Any transcriptional errors that result from this process are unintentional.

## 2023-08-22 ENCOUNTER — Ambulatory Visit (INDEPENDENT_AMBULATORY_CARE_PROVIDER_SITE_OTHER): Payer: Medicare Other | Admitting: Family Medicine

## 2023-08-22 ENCOUNTER — Encounter: Payer: Self-pay | Admitting: Family Medicine

## 2023-08-22 VITALS — BP 120/78 | HR 73 | Ht 73.0 in

## 2023-08-22 DIAGNOSIS — M9903 Segmental and somatic dysfunction of lumbar region: Secondary | ICD-10-CM

## 2023-08-22 DIAGNOSIS — M9902 Segmental and somatic dysfunction of thoracic region: Secondary | ICD-10-CM

## 2023-08-22 DIAGNOSIS — M9908 Segmental and somatic dysfunction of rib cage: Secondary | ICD-10-CM

## 2023-08-22 DIAGNOSIS — M9904 Segmental and somatic dysfunction of sacral region: Secondary | ICD-10-CM | POA: Diagnosis not present

## 2023-08-22 DIAGNOSIS — M5416 Radiculopathy, lumbar region: Secondary | ICD-10-CM | POA: Diagnosis not present

## 2023-08-22 DIAGNOSIS — M9901 Segmental and somatic dysfunction of cervical region: Secondary | ICD-10-CM

## 2023-08-22 NOTE — Patient Instructions (Signed)
See me in 6-8 weeks

## 2023-08-22 NOTE — Assessment & Plan Note (Signed)
Continue to work hard.  Patient's SVT seems to be stable.  Can work out pretty much as hard as he can at this moment.  Does respond well to osteopathic manipulation.  No significant findings that make me significantly concerned for any long-term difficulties hopefully at this time.  Known for severe arthritic changes.  If anything else such as fevers, chills, abnormal weight loss we will consider further workup again of the prostate but at the moment do think patient is in a good place.  Follow-up again in 6 to 8 weeks

## 2023-09-02 ENCOUNTER — Encounter: Payer: Self-pay | Admitting: Family Medicine

## 2023-09-13 ENCOUNTER — Encounter: Payer: Self-pay | Admitting: Family Medicine

## 2023-09-13 MED ORDER — PANTOPRAZOLE SODIUM 40 MG PO TBEC: 40.0000 mg | DELAYED_RELEASE_TABLET | Freq: Every day | ORAL | 3 refills | Status: AC

## 2023-09-24 NOTE — Progress Notes (Unsigned)
 [name] [address] [place] [address] Phone: [phone] Subjective:   Edwin [name], am serving as a scribe for Dr. Antoine Primas.  I'm seeing this patient by the request  of:  Marisue Ivan, MD  CC: Neck and back follow-up  [id number]  Edwin [name] is a 83 y.o. male coming in with complaint of back and neck pain. OMT on 08/22/2023. Patient states that he is back in the gym and back has been feeling good.   Medications patient has been prescribed: protonix  Taking:         Reviewed prior external information including notes and imaging from previsou exam, outside providers and external EMR if available.   As well as notes that were available from care everywhere and other healthcare systems.  Past medical history, social, surgical and family history all reviewed in electronic medical record.  No pertanent information unless stated regarding to the chief complaint.   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   right lateral deltoid BASAL CELL CARCINOMA, SUPERFICIAL AND NODULAR PATTERNS   BCC (basal cell carcinoma of skin) 04/23/2017   left lat neck, excision RESIDUAL BASAL CELL CARCINOMA, MARGINS FREE   BCC (basal cell carcinoma of skin) 03/13/2017   left lateral neck  BASAL CELL CARCINOMA, NODULAR PATTERN   BCC (basal cell carcinoma of skin) 12/25/2012   left pretibial   BCC (basal cell carcinoma of skin) 05/21/2007   left medial calf - , 0.8 X 0.5CM: SUPERFICIAL BASAL CELL CARCINOMA, BASE INVOLVED   BPH (benign prostatic hyperplasia)    Cancer (HCC)    Prostate Cancer   Dysrhythmia    SVT   Hyperlipidemia    Hypertension    PTSD (post-traumatic stress disorder)    Pulmonary nodules    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   right inferior cheek mandible SQUAMOUS CELL CARCINOMA IN SITU    Allergies  Allergen Reactions    Amoxicillin-Pot Clavulanate Diarrhea   Bacitracin-Neomycin-Polymyxin Hives   Hydrochlorothiazide    Lipitor [Atorvastatin]     Muscle soreness   Lisinopril Cough   Bacitracin-Polymyxin B Rash   Neosporin [Neomycin-Bacitracin Zn-Polymyx] Rash     Review of Systems:  No headache, visual changes, nausea, vomiting, diarrhea, constipation, dizziness, abdominal pain, skin rash, fevers, chills, night sweats, weight loss, swollen lymph nodes, body aches, joint swelling, chest pain, shortness of breath, mood changes. POSITIVE muscle aches  Objective  Blood pressure (!) 140/84, pulse 75, height 6\' 1"  (1.854 m), SpO2 96%.   General: No apparent distress alert and oriented x3 mood and affect normal, dressed appropriately.  HEENT: Pupils equal, extraocular movements intact  Respiratory: Patient's speak in full sentences and does not appear short of breath  Cardiovascular: No lower extremity edema, non tender, no erythema  MSK:  Back does have some loss lordosis noted.  Some tenderness to palpation noted.  Negative straight leg test noted at the moment.  Positive Faber right greater than left.  Osteopathic findings  C7 flexed rotated and side bent left T3 extended rotated and side bent right inhaled rib T7 extended rotated and side bent left L2 flexed rotated and side bent right L3 flexed rotated and side bent left Sacrum right on right       Assessment and Plan:  Lumbar radiculopathy Does have a spinal stenosis noted.  Discussed posture and ergonomics.  Patient  has had some tightness recently.  Discussed icing regimen and home exercises.  Continue to stay active and patient is lifting on a more regular basis.  Discussed with patient to continue to monitor.  Follow-up again in 6 to 8 weeks.    Nonallopathic problems  Decision today to treat with OMT was based on Physical Exam  After verbal consent patient was treated with HVLA, ME, FPR techniques in cervical, rib, thoracic, lumbar,  and sacral  areas  Patient tolerated the procedure well with improvement in symptoms  Patient given exercises, stretches and lifestyle modifications  See medications in patient instructions if given  Patient will follow up in 4-8 weeks     The above documentation has been reviewed and is accurate and complete [name], DO         Note: This dictation was prepared with Dragon dictation along with smaller phrase technology. Any transcriptional errors that result from this process are unintentional.

## 2023-09-26 ENCOUNTER — Encounter: Payer: Self-pay | Admitting: Family Medicine

## 2023-09-26 ENCOUNTER — Ambulatory Visit: Payer: Medicare Other | Admitting: Family Medicine

## 2023-09-26 VITALS — BP 140/84 | HR 75 | Ht 73.0 in

## 2023-09-26 DIAGNOSIS — M5416 Radiculopathy, lumbar region: Secondary | ICD-10-CM

## 2023-09-26 DIAGNOSIS — M9903 Segmental and somatic dysfunction of lumbar region: Secondary | ICD-10-CM

## 2023-09-26 DIAGNOSIS — M9904 Segmental and somatic dysfunction of sacral region: Secondary | ICD-10-CM | POA: Diagnosis not present

## 2023-09-26 DIAGNOSIS — M9908 Segmental and somatic dysfunction of rib cage: Secondary | ICD-10-CM

## 2023-09-26 DIAGNOSIS — M9901 Segmental and somatic dysfunction of cervical region: Secondary | ICD-10-CM | POA: Diagnosis not present

## 2023-09-26 DIAGNOSIS — M9902 Segmental and somatic dysfunction of thoracic region: Secondary | ICD-10-CM | POA: Diagnosis not present

## 2023-09-26 NOTE — Assessment & Plan Note (Signed)
 Does have a spinal stenosis noted.  Discussed posture and ergonomics.  Patient has had some tightness recently.  Discussed icing regimen and home exercises.  Continue to stay active and patient is lifting on a more regular basis.  Discussed with patient to continue to monitor.  Follow-up again in 6 to 8 weeks.

## 2023-09-26 NOTE — Patient Instructions (Signed)
Good to see you! See you again in 6-8 weeks 

## 2023-10-25 NOTE — Progress Notes (Deleted)
 [name] [address] [place] [address] Phone: [phone] Subjective:    I'm seeing this patient by the request  of:  Marisue Ivan, MD  CC: back and neck pain follow up   [id number]  Edwin [name] is a 83 y.o. male coming in with complaint of back and neck pain. OMT on 09/26/2023. Patient states   Medications patient has been prescribed:   Taking:         Reviewed prior external information including notes and imaging from previsou exam, outside providers and external EMR if available.   As well as notes that were available from care everywhere and other healthcare systems.  Past medical history, social, surgical and family history all reviewed in electronic medical record.  No pertanent information unless stated regarding to the chief complaint.   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   right lateral deltoid BASAL CELL CARCINOMA, SUPERFICIAL AND NODULAR PATTERNS   BCC (basal cell carcinoma of skin) 04/23/2017   left lat neck, excision RESIDUAL BASAL CELL CARCINOMA, MARGINS FREE   BCC (basal cell carcinoma of skin) 03/13/2017   left lateral neck  BASAL CELL CARCINOMA, NODULAR PATTERN   BCC (basal cell carcinoma of skin) 12/25/2012   left pretibial   BCC (basal cell carcinoma of skin) 05/21/2007   left medial calf - , 0.8 X 0.5CM: SUPERFICIAL BASAL CELL CARCINOMA, BASE INVOLVED   BPH (benign prostatic hyperplasia)    Cancer (HCC)    Prostate Cancer   Dysrhythmia    SVT   Hyperlipidemia    Hypertension    PTSD (post-traumatic stress disorder)    Pulmonary nodules    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   right inferior cheek mandible SQUAMOUS CELL CARCINOMA IN SITU    Allergies  Allergen Reactions   Amoxicillin-Pot Clavulanate Diarrhea   Bacitracin-Neomycin-Polymyxin Hives   Hydrochlorothiazide    Lipitor [Atorvastatin]      Muscle soreness   Lisinopril Cough   Bacitracin-Polymyxin B Rash   Neosporin [Neomycin-Bacitracin Zn-Polymyx] Rash     Review of Systems:  No headache, visual changes, nausea, vomiting, diarrhea, constipation, dizziness, abdominal pain, skin rash, fevers, chills, night sweats, weight loss, swollen lymph nodes, body aches, joint swelling, chest pain, shortness of breath, mood changes. POSITIVE muscle aches  Objective  There were no vitals taken for this visit.   General: No apparent distress alert and oriented x3 mood and affect normal, dressed appropriately.  HEENT: Pupils equal, extraocular movements intact  Respiratory: Patient's speak in full sentences and does not appear short of breath  Cardiovascular: No lower extremity edema, non tender, no erythema  Gait MSK:  Back   Osteopathic findings  C2 flexed rotated and side bent right C6 flexed rotated and side bent left T3 extended rotated and side bent right inhaled rib T9 extended rotated and side bent left L2 flexed rotated and side bent right Sacrum right on right       Assessment and Plan:  No problem-specific Assessment & Plan notes found for this encounter.    Nonallopathic problems  Decision today to treat with OMT was based on Physical Exam  After verbal consent patient was treated with HVLA, ME, FPR techniques in cervical, rib, thoracic, lumbar, and sacral  areas  Patient tolerated the procedure well with improvement in symptoms  Patient given exercises, stretches and lifestyle modifications  See  medications in patient instructions if given  Patient will follow up in 4-8 weeks    The above documentation has been reviewed and is accurate and complete [name], DO          Note: This dictation was prepared with Dragon dictation along with smaller phrase technology. Any transcriptional errors that result from this process are unintentional.

## 2023-10-30 ENCOUNTER — Ambulatory Visit: Payer: Medicare Other | Admitting: Family Medicine

## 2023-10-30 ENCOUNTER — Other Ambulatory Visit: Payer: Self-pay | Admitting: Family Medicine

## 2023-10-30 MED ORDER — MONTELUKAST SODIUM 10 MG PO TABS: 10.0000 mg | ORAL_TABLET | Freq: Every day | ORAL | 3 refills | Status: AC

## 2023-10-30 NOTE — Progress Notes (Signed)
 Sent in Singulair for patient.  Has been significant difficulty with his allergies with sneezing.  Has been on antihistamines as well as inhaler.  Warned of blackbox warning.

## 2023-11-27 ENCOUNTER — Encounter: Payer: Self-pay | Admitting: Family Medicine

## 2023-12-03 NOTE — Progress Notes (Unsigned)
 [facility] [address] [place] [address] Phone: [phone] Subjective:   IBryan Caprio, am serving as a scribe for Dr. Ronnell Coins.  I'm seeing this patient by the request  of:  Monique Ano, MD  CC: Low back pain follow-up  [id number]  Edwin [name] is a 83 y.o. male coming in with complaint of back and neck pain. OMT on 09/26/2023. Patient states same per usual. No new symptoms.  Medications patient has been prescribed:   Taking:   Most recent PSA did go up to 1.0 from 0.6.        Reviewed prior external information including notes and imaging from previsou exam, outside providers and external EMR if available.   As well as notes that were available from care everywhere and other healthcare systems.  Past medical history, social, surgical and family history all reviewed in electronic medical record.  No pertanent information unless stated regarding to the chief complaint.   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   right lateral deltoid BASAL CELL CARCINOMA, SUPERFICIAL AND NODULAR PATTERNS   BCC (basal cell carcinoma of skin) 04/23/2017   left lat neck, excision RESIDUAL BASAL CELL CARCINOMA, MARGINS FREE   BCC (basal cell carcinoma of skin) 03/13/2017   left lateral neck  BASAL CELL CARCINOMA, NODULAR PATTERN   BCC (basal cell carcinoma of skin) 12/25/2012   left pretibial   BCC (basal cell carcinoma of skin) 05/21/2007   left medial calf - , 0.8 X 0.5CM: SUPERFICIAL BASAL CELL CARCINOMA, BASE INVOLVED   BPH (benign prostatic hyperplasia)    Cancer (HCC)    Prostate Cancer   Dysrhythmia    SVT   Hyperlipidemia    Hypertension    PTSD (post-traumatic stress disorder)    Pulmonary nodules    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   right inferior cheek mandible SQUAMOUS CELL CARCINOMA IN SITU    Allergies  Allergen  Reactions   Amoxicillin -Pot Clavulanate Diarrhea   Bacitracin-Neomycin-Polymyxin Hives   Hydrochlorothiazide    Lipitor [Atorvastatin]     Muscle soreness   Lisinopril Cough   Bacitracin-Polymyxin B Rash   Neosporin [Neomycin-Bacitracin Zn-Polymyx] Rash     Review of Systems:  No headache, visual changes, nausea, vomiting, diarrhea, constipation, dizziness, abdominal pain, skin rash, fevers, chills, night sweats, weight loss, swollen lymph nodes, body aches, joint swelling, chest pain, shortness of breath, mood changes. POSITIVE muscle aches  Objective  Blood pressure 126/72, pulse 67, height 6\' 1"  (1.854 m), SpO2 97%.   General: No apparent distress alert and oriented x3 mood and affect normal, dressed appropriately.  HEENT: Pupils equal, extraocular movements intact  Respiratory: Patient's speak in full sentences and does not appear short of breath  Cardiovascular: No lower extremity edema, non tender, no erythema  Gait normal overall MSK:  Back back and neck pain follow-up tightness noted in the paraspinal musculature.  Significant discomfort to even in the thoracolumbar juncture and then in the lumbar spine.  No midline tenderness.  Limited sidebending of the neck  Osteopathic findings  C2 flexed rotated and side bent right C3 flexed rotated and side bent right C6 flexed rotated and side bent left T3 extended rotated and side bent right inhaled rib T9 extended rotated and side bent left L2 flexed rotated and side bent right Sacrum right on right  Assessment and Plan:  Degenerative disc disease, cervical Degenerative disc, discussed with patient about icing regimen and home exercises, continue to monitor.  Discussed icing regimen but does respond well to osteopathic manipulation.  Follow-up again in 6 to 8 weeks Toradol  and Depo-Medrol  injections given today as well.  Malignant neoplasm of prostate (HCC) PSA did increase to 1.0.  Will likely recheck in 6 to 8  weeks.    Nonallopathic problems  Decision today to treat with OMT was based on Physical Exam  After verbal consent patient was treated with HVLA, ME, FPR techniques in cervical, rib, thoracic, lumbar, and sacral  areas  Patient tolerated the procedure well with improvement in symptoms  Patient given exercises, stretches and lifestyle modifications  See medications in patient instructions if given  Patient will follow up in 4-8 weeks     The above documentation has been reviewed and is accurate and complete Tyric Rodeheaver M Jadwiga Faidley, DO         Note: This dictation was prepared with Dragon dictation along with smaller phrase technology. Any transcriptional errors that result from this process are unintentional.

## 2023-12-04 ENCOUNTER — Ambulatory Visit: Admitting: Family Medicine

## 2023-12-04 VITALS — BP 126/72 | HR 67 | Ht 73.0 in

## 2023-12-04 DIAGNOSIS — M5416 Radiculopathy, lumbar region: Secondary | ICD-10-CM | POA: Diagnosis not present

## 2023-12-04 DIAGNOSIS — M9903 Segmental and somatic dysfunction of lumbar region: Secondary | ICD-10-CM

## 2023-12-04 DIAGNOSIS — M9904 Segmental and somatic dysfunction of sacral region: Secondary | ICD-10-CM | POA: Diagnosis not present

## 2023-12-04 DIAGNOSIS — M9901 Segmental and somatic dysfunction of cervical region: Secondary | ICD-10-CM

## 2023-12-04 DIAGNOSIS — M5030 Other cervical disc degeneration, unspecified cervical region: Secondary | ICD-10-CM

## 2023-12-04 DIAGNOSIS — M9902 Segmental and somatic dysfunction of thoracic region: Secondary | ICD-10-CM | POA: Diagnosis not present

## 2023-12-04 DIAGNOSIS — M9908 Segmental and somatic dysfunction of rib cage: Secondary | ICD-10-CM

## 2023-12-04 DIAGNOSIS — C61 Malignant neoplasm of prostate: Secondary | ICD-10-CM | POA: Diagnosis not present

## 2023-12-04 MED ORDER — METHYLPREDNISOLONE ACETATE 40 MG/ML IJ SUSP
40.0000 mg | Freq: Once | INTRAMUSCULAR | Status: AC
Start: 2023-12-04 — End: 2023-12-04
  Administered 2023-12-04: 40 mg via INTRAMUSCULAR

## 2023-12-04 MED ORDER — KETOROLAC TROMETHAMINE 30 MG/ML IJ SOLN
30.0000 mg | Freq: Once | INTRAMUSCULAR | Status: AC
  Administered 2023-12-04: 30 mg via INTRAMUSCULAR

## 2023-12-04 NOTE — Assessment & Plan Note (Signed)
 PSA did increase to 1.0.  Will likely recheck in 6 to 8 weeks.

## 2023-12-04 NOTE — Patient Instructions (Addendum)
 Half Cocktail injection See you again in 6 weeks

## 2023-12-04 NOTE — Assessment & Plan Note (Addendum)
 Degenerative disc, discussed with patient about icing regimen and home exercises, continue to monitor.  Discussed icing regimen but does respond well to osteopathic manipulation.  Follow-up again in 6 to 8 weeks Toradol  and Depo-Medrol  injections given today as well.

## 2023-12-05 ENCOUNTER — Encounter: Payer: Self-pay | Admitting: Family Medicine

## 2023-12-05 NOTE — Assessment & Plan Note (Addendum)
 Likely no significant change with significant stiffness with patient missing his last appointment.  Has responded well to osteopathic manipulation after evaluation.  Discussed different medications.  Discussed Cymbalta  again.  Past medical history significant for prostate cancer but PSA is staying low.  Will do 1 to check total of the next 3 to 6 months with some mild uptake from previous.  Follow-up with me again 6 to 8 weeks Toradol  and Depo-Medrol  injections given today

## 2024-01-08 NOTE — Progress Notes (Unsigned)
 Edwin [name] [address] [place] [address] Phone: [phone] Subjective:   Edwin [name], am serving as a scribe for Edwin [name].  I'm seeing this patient by the request  of:  Edwin Ano, MD  CC: low back pain   LKG:[id number]  Edwin [name] is a 83 y.o. male coming in with complaint of back and neck pain. OMT on 12/04/2023. Patient states that he is here for maintenance appointment.   Medications patient has been prescribed:   Taking:    Patient did have some lab work done in April that did show some mild patient PSA to patient's baseline 0.6.     Reviewed prior external information including notes and imaging from previsou exam, outside providers and external EMR if available.   As well as notes that were available from care everywhere and other healthcare systems.  Past medical history, social, surgical and family history all reviewed in electronic medical record.  No pertanent information unless stated regarding to the chief complaint.   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   right lateral deltoid BASAL CELL CARCINOMA, SUPERFICIAL AND NODULAR PATTERNS   BCC (basal cell carcinoma of skin) 04/23/2017   left lat neck, excision RESIDUAL BASAL CELL CARCINOMA, MARGINS FREE   BCC (basal cell carcinoma of skin) 03/13/2017   left lateral neck  BASAL CELL CARCINOMA, NODULAR PATTERN   BCC (basal cell carcinoma of skin) 12/25/2012   left pretibial   BCC (basal cell carcinoma of skin) 05/21/2007   left medial calf - , 0.8 X 0.5CM: SUPERFICIAL BASAL CELL CARCINOMA, BASE INVOLVED   BPH (benign prostatic hyperplasia)    Cancer (HCC)    Prostate Cancer   Dysrhythmia    SVT   Hyperlipidemia    Hypertension    PTSD (post-traumatic stress disorder)    Pulmonary nodules    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 POSITIVE muscle aches  Objective  Blood pressure (!) 150/78, pulse 74, height 6' 1 (1.854 m), SpO2 95%.   General: No apparent distress alert and oriented x3 mood and affect normal, dressed appropriately.  HEENT: Pupils equal, extraocular movements intact  Respiratory: Patient's speak in full sentences and does not appear short of breath  Cardiovascular: No lower extremity edema, non tender, no erythema  Gait MSK:  Back does have some loss lordosis.  Some tenderness to patient paraspinal trigger.  Right greater than left.  Limited extension of the back.  Osteopathic findings  C6 flexed rotated and side bent left T3 extended rotated and side bent right inhaled rib T9 extended rotated and side bent left L2 flexed rotated and side bent right L3 flexed rotated and side bent left Sacrum right on right       Assessment and Plan:  Lumbar radiculopathy Continue to monitor.  Does have some tightness noted.  Discussed icing regimen and home exercises.  Patient seems to be a little more quiet than usual.  Do not know if it is from pain follow-up again in 6 to 8 weeks    Nonallopathic problems  Decision today to treat with OMT was based on Physical Exam  After verbal consent patient was treated with HVLA, ME, FPR techniques in cervical, rib, thoracic, lumbar, and sacral  areas  Patient tolerated  the procedure well with improvement in symptoms  Patient given exercises, stretches and lifestyle modifications  See medications in patient instructions if given  Patient will follow up in 4-8 weeks    The above documentation has been reviewed and is accurate and complete Edwin Shanley M Niccolo Burggraf, DO          Note: This dictation was prepared with Dragon dictation along with smaller phrase technology. Any transcriptional errors that result from this process are unintentional.

## 2024-01-09 ENCOUNTER — Ambulatory Visit: Admitting: Family Medicine

## 2024-01-09 ENCOUNTER — Encounter: Payer: Self-pay | Admitting: Family Medicine

## 2024-01-09 VITALS — BP 150/78 | HR 74 | Ht 73.0 in

## 2024-01-09 DIAGNOSIS — M9904 Segmental and somatic dysfunction of sacral region: Secondary | ICD-10-CM

## 2024-01-09 DIAGNOSIS — M9908 Segmental and somatic dysfunction of rib cage: Secondary | ICD-10-CM | POA: Diagnosis not present

## 2024-01-09 DIAGNOSIS — M9902 Segmental and somatic dysfunction of thoracic region: Secondary | ICD-10-CM

## 2024-01-09 DIAGNOSIS — M9901 Segmental and somatic dysfunction of cervical region: Secondary | ICD-10-CM

## 2024-01-09 DIAGNOSIS — M5416 Radiculopathy, lumbar region: Secondary | ICD-10-CM

## 2024-01-09 DIAGNOSIS — M9903 Segmental and somatic dysfunction of lumbar region: Secondary | ICD-10-CM

## 2024-01-09 NOTE — Assessment & Plan Note (Signed)
 Continue to monitor.  Does have some tightness noted.  Discussed icing regimen and home exercises.  Patient seems to be a little more quiet than usual.  Do not know if it is from pain follow-up again in 6 to 8 weeks

## 2024-01-09 NOTE — Patient Instructions (Signed)
See me again in 6 weeks 

## 2024-01-10 ENCOUNTER — Other Ambulatory Visit: Payer: Self-pay

## 2024-01-10 MED ORDER — METOPROLOL TARTRATE 25 MG PO TABS: 25.0000 mg | ORAL_TABLET | Freq: Every day | ORAL | 1 refills | Status: AC | PRN

## 2024-01-15 ENCOUNTER — Ambulatory Visit: Payer: Medicare Other | Admitting: Dermatology

## 2024-02-12 NOTE — Progress Notes (Unsigned)
 [name] [address] [place] [address] Phone: [phone] Subjective:   LILLETTE Claretha Schimke am a scribe for Dr. Claudene.   I'm seeing this patient by the request  of:  Alla Amis, MD  CC: Back and neck pain follow-up  YEP:Dlagzrupcz  MIECZYSLAW STAMAS is a 83 y.o. male coming in with complaint of back and neck pain. OMT on 01/09/2024. Patient states nothing new to report.   Medications patient has been prescribed: none  Taking: none         Reviewed prior external information including notes and imaging from previsou exam, outside providers and external EMR if available.   As well as notes that were available from care everywhere and other healthcare systems.  Past medical history, social, surgical and family history all reviewed in electronic medical record.  No pertanent information unless stated regarding to the chief complaint.   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   right lateral deltoid BASAL CELL CARCINOMA, SUPERFICIAL AND NODULAR PATTERNS   BCC (basal cell carcinoma of skin) 04/23/2017   left lat neck, excision RESIDUAL BASAL CELL CARCINOMA, MARGINS FREE   BCC (basal cell carcinoma of skin) 03/13/2017   left lateral neck  BASAL CELL CARCINOMA, NODULAR PATTERN   BCC (basal cell carcinoma of skin) 12/25/2012   left pretibial   BCC (basal cell carcinoma of skin) 05/21/2007   left medial calf - , 0.8 X 0.5CM: SUPERFICIAL BASAL CELL CARCINOMA, BASE INVOLVED   BPH (benign prostatic hyperplasia)    Cancer (HCC)    Prostate Cancer   Dysrhythmia    SVT   Hyperlipidemia    Hypertension    PTSD (post-traumatic stress disorder)    Pulmonary nodules    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 POSITIVE muscle aches  Objective  Blood pressure (!) 150/70, pulse 79, height 6' 1 (1.854 m), SpO2 96%.   General: No apparent distress alert and oriented x3 mood and affect normal, dressed appropriately.  HEENT: Pupils equal, extraocular movements intact  Respiratory: Patient's speak in full sentences and does not appear short of breath  Cardiovascular: No lower extremity edema, non tender, no erythema  Gait relatively normal MSK:  Back does have some loss lordosis noted.  Mild stenosis uses the tightness noted at with the tightness with the right side of the chest more than the left but both are tight  Osteopathic findings  C3 flexed rotated and side bent right C6 flexed rotated and side bent left T6 extended rotated and side bent right inhaled rib L2 flexed rotated and side bent right Sacrum right on right       Assessment and Plan:  Lumbar radiculopathy Continued tightness noted.  Discussed with patient may be missing some of the vitamin supplementation.  Denies any neck increase activities.  Follow-up again in 6 to 8 weeks.    Nonallopathic problems  Decision today to treat with OMT was based on Physical Exam  After verbal consent patient was treated with HVLA, ME, FPR techniques in cervical, rib, thoracic, lumbar, and sacral  areas going to the cervical spine  Patient tolerated the procedure well with improvement in symptoms  Patient given exercises, stretches and lifestyle modifications  See  medications in patient instructions if given  Patient will follow up in 4-8 weeks    The above documentation has been reviewed and is accurate and complete Arthea CHRISTELLA Sharps, DO          Note: This dictation was prepared with Dragon dictation along with smaller phrase technology. Any transcriptional errors that result from this process are unintentional.

## 2024-02-13 ENCOUNTER — Encounter: Payer: Self-pay | Admitting: Family Medicine

## 2024-02-13 ENCOUNTER — Ambulatory Visit: Admitting: Family Medicine

## 2024-02-13 VITALS — BP 150/70 | HR 79 | Ht 73.0 in

## 2024-02-13 DIAGNOSIS — M9908 Segmental and somatic dysfunction of rib cage: Secondary | ICD-10-CM | POA: Diagnosis not present

## 2024-02-13 DIAGNOSIS — M9904 Segmental and somatic dysfunction of sacral region: Secondary | ICD-10-CM | POA: Diagnosis not present

## 2024-02-13 DIAGNOSIS — M9903 Segmental and somatic dysfunction of lumbar region: Secondary | ICD-10-CM | POA: Diagnosis not present

## 2024-02-13 DIAGNOSIS — M9902 Segmental and somatic dysfunction of thoracic region: Secondary | ICD-10-CM

## 2024-02-13 DIAGNOSIS — M9901 Segmental and somatic dysfunction of cervical region: Secondary | ICD-10-CM

## 2024-02-13 DIAGNOSIS — M5416 Radiculopathy, lumbar region: Secondary | ICD-10-CM | POA: Diagnosis not present

## 2024-02-13 NOTE — Assessment & Plan Note (Signed)
 Continued tightness noted.  Discussed with patient may be missing some of the vitamin supplementation.  Denies any neck increase activities.  Follow-up again in 6 to 8 weeks.

## 2024-02-14 ENCOUNTER — Encounter: Payer: Self-pay | Admitting: Family Medicine

## 2024-02-26 ENCOUNTER — Ambulatory Visit: Admitting: Dermatology

## 2024-02-26 ENCOUNTER — Encounter: Payer: Self-pay | Admitting: Dermatology

## 2024-02-26 DIAGNOSIS — Z1283 Encounter for screening for malignant neoplasm of skin: Secondary | ICD-10-CM

## 2024-02-26 DIAGNOSIS — B353 Tinea pedis: Secondary | ICD-10-CM

## 2024-02-26 DIAGNOSIS — D229 Melanocytic nevi, unspecified: Secondary | ICD-10-CM

## 2024-02-26 DIAGNOSIS — L570 Actinic keratosis: Secondary | ICD-10-CM

## 2024-02-26 DIAGNOSIS — L821 Other seborrheic keratosis: Secondary | ICD-10-CM

## 2024-02-26 DIAGNOSIS — B351 Tinea unguium: Secondary | ICD-10-CM

## 2024-02-26 DIAGNOSIS — Z85828 Personal history of other malignant neoplasm of skin: Secondary | ICD-10-CM

## 2024-02-26 DIAGNOSIS — Z79899 Other long term (current) drug therapy: Secondary | ICD-10-CM

## 2024-02-26 DIAGNOSIS — Z5111 Encounter for antineoplastic chemotherapy: Secondary | ICD-10-CM

## 2024-02-26 DIAGNOSIS — Z8589 Personal history of malignant neoplasm of other organs and systems: Secondary | ICD-10-CM

## 2024-02-26 DIAGNOSIS — L814 Other melanin hyperpigmentation: Secondary | ICD-10-CM

## 2024-02-26 DIAGNOSIS — L820 Inflamed seborrheic keratosis: Secondary | ICD-10-CM | POA: Diagnosis not present

## 2024-02-26 DIAGNOSIS — L578 Other skin changes due to chronic exposure to nonionizing radiation: Secondary | ICD-10-CM | POA: Diagnosis not present

## 2024-02-26 DIAGNOSIS — W908XXA Exposure to other nonionizing radiation, initial encounter: Secondary | ICD-10-CM

## 2024-02-26 DIAGNOSIS — Z7189 Other specified counseling: Secondary | ICD-10-CM

## 2024-02-26 DIAGNOSIS — D1801 Hemangioma of skin and subcutaneous tissue: Secondary | ICD-10-CM

## 2024-02-26 MED ORDER — TERBINAFINE HCL 250 MG PO TABS: 250.0000 mg | ORAL_TABLET | Freq: Every day | ORAL | 0 refills | Status: AC

## 2024-02-26 NOTE — Progress Notes (Signed)
 Follow-Up Visit   Subjective  Edwin [name] is a 83 y.o. male who presents for the following: Skin Cancer Screening and Full Body Skin Exam Hx of sccis, hx of bcc Hx of aks, isks  The patient presents for Total-Body Skin Exam (TBSE) for skin cancer screening and mole check. The patient has spots, moles and lesions to be evaluated, some may be new or changing and the patient may have concern these could be cancer.  The following portions of the chart were reviewed this encounter and updated as appropriate: medications, allergies, medical history  Review of Systems:  No other skin or systemic complaints except as noted in HPI or Assessment and Plan.  Objective  Well appearing patient in no apparent distress; mood and affect are within normal limits.  A full examination was performed including scalp, head, eyes, ears, nose, lips, neck, chest, axillae, abdomen, back, buttocks, bilateral upper extremities, bilateral lower extremities, hands, feet, fingers, toes, fingernails, and toenails. All findings within normal limits unless otherwise noted below.   Relevant physical exam findings are noted in the Assessment and Plan.  scalp x 16, nose x 1 (17) Erythematous thin papules/macules with gritty scale.  left upper arm x 1 Erythematous stuck-on, waxy papule or plaque  Assessment & Plan   SKIN CANCER SCREENING PERFORMED TODAY.   LENTIGINES, SEBORRHEIC KERATOSES, HEMANGIOMAS - Benign normal skin lesions - Benign-appearing - Call for any changes  MELANOCYTIC NEVI - Tan-brown and/or pink-flesh-colored symmetric macules and papules - Benign appearing on exam today - Observation - Call clinic for new or changing moles - Recommend daily use of broad spectrum spf 30+ sunscreen to sun-exposed areas.   TINEA PEDIS Exam: Scaling and maceration web spaces and over distal and lateral soles and ankle of left foot.  Chronic and persistent condition with duration or expected duration over one  year. Condition is symptomatic / bothersome to patient. Not to goal. Treatment Plan: Reviewed  labs from 06/2023 chemistries, CBC with Diff and LFT - all within normal range - reviewed on patient's phone Start Terbinafine  250 mg tab - 1 po tab for 1 month. Patient instructed to call or send mychart letting us  know he is finished with medication and having no side effects. Will send 2 refills of medication.   Terbinafine  Counseling Terbinafine  is an anti-fungal medicine that can be applied to the skin (over the counter) or taken by mouth (prescription) to treat fungal infections. The pill version is often used to treat fungal infections of the nails or scalp. While most people do not have any side effects from taking terbinafine  pills, some possible side effects of the medicine can include taste changes, headache, loss of smell, vision changes, nausea, vomiting, or diarrhea.    Rare side effects can include irritation of the liver, allergic reaction, or decrease in blood counts (which may show up as not feeling well or developing an infection). If you are concerned about any of these side effects, please stop the medicine and call your doctor, or in the case of an emergency such as feeling very unwell, seek immediate medical care.   HISTORY OF BASAL CELL CARCINOMA OF THE SKIN 07/02/2018 right lateral deltoid  04/23/2017 left lateral neck - excision  03/13/2017 left lateral neck nodular pattern 12/25/2012 - left pretibial  05/21/2007 left medial calf - base involved - No evidence of recurrence today - Recommend regular full body skin exams - Recommend daily broad spectrum sunscreen SPF 30+ to sun-exposed areas, reapply every 2  hours as needed.  - Call if any new or changing lesions are noted between office visits  HISTORY OF SQUAMOUS CELL CARCINOMA IN SITU OF THE SKIN 03/13/2017 right inferior cheek mandible  - No evidence of recurrence today - Recommend regular full body skin exams - Recommend  daily broad spectrum sunscreen SPF 30+ to sun-exposed areas, reapply every 2 hours as needed.  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 - diffuse scaly erythematous macules and papules with underlying dyspigmentation - Discussed Prescription Field Treatment topical Chemotherapy for Severe, Chronic Confluent Actinic Changes with Pre-Cancerous Actinic Keratoses Field treatment involves treatment of an entire area of skin that has confluent Actinic Changes (Sun/ Ultraviolet light damage) and PreCancerous Actinic Keratoses by method of PhotoDynamic Therapy (PDT) and/or prescription Topical Chemotherapy agents such as 5-fluorouracil , 5-fluorouracil /calcipotriene, and/or imiquimod.  The purpose is to decrease the number of clinically evident and subclinical PreCancerous lesions to prevent progression to development of skin cancer by chemically destroying early precancer changes that may or may not be visible.  It has been shown to reduce the risk of developing skin cancer in the treated area. As a result of treatment, redness, scaling, crusting, and open sores may occur during treatment course. One or more than one of these methods may be used and may have to be used several times to control, suppress and eliminate the PreCancerous changes. Discussed treatment course, expected reaction, and possible side effects. - Recommend daily broad spectrum sunscreen SPF 30+ to sun-exposed areas, reapply every 2 hours as needed.  - Staying in the shade or wearing long sleeves, sun glasses (UVA+UVB protection) and wide brim hats (4-inch brim  around the entire circumference of the hat) are also recommended. - Call for new or changing lesions.  - Start in mid September  5-fluorouracil /calcipotriene cream twice a day for 10 days to affected areas including scalp. Prescription sent to [facility]. Patient advised they will receive an email to purchase the medication online and have it sent to their home. Patient provided with handout reviewing treatment course and side effects and advised to call or message us  on MyChart with any concerns.  Reviewed course of treatment and expected reaction.  Patient advised to expect inflammation and crusting and advised that erosions are possible.  Patient advised to be diligent with sun protection during and after treatment. Counseled to keep medication out of reach of children and pets.  Actinic keratoses are precancerous spots that appear secondary to cumulative UV radiation exposure/sun exposure over time. They are chronic with expected duration over 1 year. A portion of actinic keratoses will progress to squamous cell carcinoma of the skin. It is not possible to reliably predict which spots will progress to skin cancer and so treatment is recommended to prevent development of skin cancer.  Recommend daily broad spectrum sunscreen SPF 30+ to sun-exposed areas, reapply every 2 hours as needed.  Recommend staying in the shade or wearing long sleeves, sun glasses (UVA+UVB protection) and wide brim hats (4-inch brim around the entire circumference of the hat). Call for new or changing lesions. Destruction of lesion - scalp x 16, nose x 1 (17) Complexity:  simple   Destruction method: cryotherapy   Informed consent: discussed and consent obtained   Timeout:  patient name, date of birth, surgical site, and procedure verified Lesion destroyed using liquid nitrogen: Yes   Region frozen until ice ball extended beyond lesion: Yes   Outcome: patient tolerated procedure well with no  complications   Post-procedure details: wound care instructions given    INFLAMED SEBORRHEIC KERATOSIS left upper arm x 1 Symptomatic, irritating, patient would like treated. Destruction of lesion - left upper arm x 1 Complexity: simple   Destruction method: cryotherapy   Informed consent: discussed and consent obtained   Timeout:  patient name, date of birth, surgical site, and procedure verified Lesion destroyed using liquid nitrogen: Yes   Region frozen until ice ball extended beyond lesion: Yes   Outcome: patient tolerated procedure well with no complications   Post-procedure details: wound care instructions given    TINEA PEDIS OF BOTH FEET   Related Medications terbinafine  (LAMISIL ) 250 MG tablet Take 1 tablet (250 mg total) by mouth daily. Return in about 1 year (around 02/25/2025) for TBSE.  IEleanor Blush, CMA, am acting as scribe for Alm Rhyme, MD.   Documentation: I have reviewed the above documentation for accuracy and completeness, and I agree with the above.  Alm Rhyme, MD

## 2024-02-26 NOTE — Patient Instructions (Addendum)
 For foot fungus  Start terbinafine  250 mg tab take 1 by mouth daily for 1 month. If doing well on medication no side effects call or send mychart and will send an additional 2 refills of medication    Terbinafine  Counseling  Terbinafine  is an anti-fungal medicine that can be applied to the skin (over the counter) or taken by mouth (prescription) to treat fungal infections. The pill version is often used to treat fungal infections of the nails or scalp. While most people do not have any side effects from taking terbinafine  pills, some possible side effects of the medicine can include taste changes, headache, loss of smell, vision changes, nausea, vomiting, or diarrhea.   Rare side effects can include irritation of the liver, allergic reaction, or decrease in blood counts (which may show up as not feeling well or developing an infection). If you are concerned about any of these side effects, please stop the medicine and call your doctor, or in the case of an emergency such as feeling very unwell, seek immediate medical care.       - Start in [facility] September  5-fluorouracil /calcipotriene cream twice a day for 10 days to affected areas including scalp. Prescription sent to [facility]. Patient advised they will receive an email to purchase the medication online and have it sent to their home. Patient provided with handout reviewing treatment course and side effects and advised to call or message us  on MyChart with any concerns.  Reviewed course of treatment and expected reaction.  Patient advised to expect inflammation and crusting and advised that erosions are possible.  Patient advised to be diligent with sun protection during and after treatment. Counseled to keep medication out of reach of children and pets.  Instructions for [facility]  One or more of your medications was sent to the [facility] mail order [facility] pharmacy. You will receive an  email from them and can purchase the medicine through that link. It will then be mailed to your home at the address you confirmed. If for any reason you do not receive an email from them, please check your spam folder. If you still do not find the email, please let us  know. [facility] phone number is [phone].   5-Fluorouracil /Calcipotriene Patient Education   Actinic keratoses are the dry, red scaly spots on the skin caused by sun damage. A portion of these spots can turn into skin cancer with time, and treating them can help prevent development of skin cancer.   Treatment of these spots requires removal of the defective skin cells. There are various ways to remove actinic keratoses, including freezing with liquid nitrogen, treatment with creams, or treatment with a blue light procedure in the office.   5-fluorouracil  cream is a topical cream used to treat actinic keratoses. It works by interfering with the growth of abnormal fast-growing skin cells, such as actinic keratoses. These cells peel off and are replaced by healthy ones.   5-fluorouracil /calcipotriene is a combination of the 5-fluorouracil  cream with a vitamin D  analog cream called calcipotriene. The calcipotriene alone does not treat actinic keratoses. However, when it is combined with 5-fluorouracil , it helps the 5-fluorouracil  treat the actinic keratoses much faster so that the same results can be achieved with a much shorter treatment time.  INSTRUCTIONS FOR 5-FLUOROURACIL /CALCIPOTRIENE CREAM:   5-fluorouracil /calcipotriene cream typically only needs to be used for 4-7 days. A thin layer should be applied twice a day to the treatment areas recommended by your  physician.   If your physician prescribed you separate tubes of 5-fluourouracil and calcipotriene, apply a thin layer of 5-fluorouracil  followed by a thin layer of calcipotriene.   Avoid contact with your eyes, nostrils, and mouth. Do not use  5-fluorouracil /calcipotriene cream on infected or open wounds.   You will develop redness, irritation and some crusting at areas where you have pre-cancer damage/actinic keratoses. IF YOU DEVELOP PAIN, BLEEDING, OR SIGNIFICANT CRUSTING, STOP THE TREATMENT EARLY - you have already gotten a good response and the actinic keratoses should clear up well.  Wash your hands after applying 5-fluorouracil  5% cream on your skin.   A moisturizer or sunscreen with a minimum SPF 30 should be applied each morning.   Once you have finished the treatment, you can apply a thin layer of Vaseline twice a day to irritated areas to soothe and calm the areas more quickly. If you experience significant discomfort, contact your physician.  For some patients it is necessary to repeat the treatment for best results.  SIDE EFFECTS: When using 5-fluorouracil /calcipotriene cream, you may have mild irritation, such as redness, dryness, swelling, or a mild burning sensation. This usually resolves within 2 weeks. The more actinic keratoses you have, the more redness and inflammation you can expect during treatment. Eye irritation has been reported rarely. If this occurs, please let us  know.  If you have any trouble using this cream, please call the office. If you have any other questions about this information, please do not hesitate to ask me before you leave the office.    Actinic keratoses are precancerous spots that appear secondary to cumulative UV radiation exposure/sun exposure over time. They are chronic with expected duration over 1 year. A portion of actinic keratoses will progress to squamous cell carcinoma of the skin. It is not possible to reliably predict which spots will progress to skin cancer and so treatment is recommended to prevent development of skin cancer.  Recommend daily broad spectrum sunscreen SPF 30+ to sun-exposed areas, reapply every 2 hours as needed.  Recommend staying in the shade or wearing long  sleeves, sun glasses (UVA+UVB protection) and wide brim hats (4-inch brim around the entire circumference of the hat). Call for new or changing lesions.   Cryotherapy Aftercare  Wash gently with soap and water everyday.   Apply Vaseline and Band-Aid daily until healed.   Seborrheic Keratosis  What causes seborrheic keratoses? Seborrheic keratoses are harmless, common skin growths that first appear during adult life.  As time goes by, more growths appear.  Some people may develop a large number of them.  Seborrheic keratoses appear on both covered and uncovered body parts.  They are not caused by sunlight.  The tendency to develop seborrheic keratoses can be inherited.  They vary in color from skin-colored to gray, brown, or even black.  They can be either smooth or have a rough, warty surface.   Seborrheic keratoses are superficial and look as if they were stuck on the skin.  Under the microscope this type of keratosis looks like layers upon layers of skin.  That is why at times the top layer may seem to fall off, but the rest of the growth remains and re-grows.    Treatment Seborrheic keratoses do not need to be treated, but can easily be removed in the office.  Seborrheic keratoses often cause symptoms when they rub on clothing or jewelry.  Lesions can be in the way of shaving.  If they become inflamed, they can  cause itching, soreness, or burning.  Removal of a seborrheic keratosis can be accomplished by freezing, burning, or surgery. If any spot bleeds, scabs, or grows rapidly, please return to have it checked, as these can be an indication of a skin cancer.    Melanoma ABCDEs  Melanoma is the most dangerous type of skin cancer, and is the leading cause of death from skin disease.  You are more likely to develop melanoma if you: Have light-colored skin, light-colored eyes, or red or blond hair Spend a lot of time in the sun Tan regularly, either outdoors or in a tanning bed Have had  blistering sunburns, especially during childhood Have a close family member who has had a melanoma Have atypical moles or large birthmarks  Early detection of melanoma is key since treatment is typically straightforward and cure rates are extremely high if we catch it early.   The first sign of melanoma is often a change in a mole or a new dark spot.  The ABCDE system is a way of remembering the signs of melanoma.  A for asymmetry:  The two halves do not match. B for border:  The edges of the growth are irregular. C for color:  A mixture of colors are present instead of an even brown color. D for diameter:  Melanomas are usually (but not always) greater than 6mm - the size of a pencil eraser. E for evolution:  The spot keeps changing in size, shape, and color.  Please check your skin once per month between visits. You can use a small mirror in front and a large mirror behind you to keep an eye on the back side or your body.   If you see any new or changing lesions before your next follow-up, please call to schedule a visit.  Please continue daily skin protection including broad spectrum sunscreen SPF 30+ to sun-exposed areas, reapplying every 2 hours as needed when you're outdoors.   Staying in the shade or wearing long sleeves, sun glasses (UVA+UVB protection) and wide brim hats (4-inch brim around the entire circumference of the hat) are also recommended for sun protection.    Due to recent changes in healthcare laws, you may see results of your pathology and/or laboratory studies on MyChart before the doctors have had a chance to review them. We understand that in some cases there may be results that are confusing or concerning to you. Please understand that not all results are received at the same time and often the doctors may need to interpret multiple results in order to provide you with the best plan of care or course of treatment. Therefore, we ask that you please give us  2 business  days to thoroughly review all your results before contacting the office for clarification. Should we see a critical lab result, you will be contacted sooner.   If You Need Anything After Your Visit  If you have any questions or concerns for your doctor, please call our main line at [phone] and press option 4 to reach your doctor's medical assistant. If no one answers, please leave a voicemail as directed and we will return your call as soon as possible. Messages left after 4 pm will be answered the following business day.   You may also send us  a message via MyChart. We typically respond to MyChart messages within 1-2 business days.  For prescription refills, please ask your pharmacy to contact our office. Our fax number is [phone].  If you have  an urgent issue when the clinic is closed that cannot wait until the next business day, you can page your doctor at the number below.    Please note that while we do our best to be available for urgent issues outside of office hours, we are not available 24/7.   If you have an urgent issue and are unable to reach us , you may choose to seek medical care at your doctor's office, retail clinic, urgent care center, or emergency room.  If you have a medical emergency, please immediately call 911 or go to the emergency department.  Pager Numbers  - Dr. Hester: [phone]  - Dr. Jackquline: [phone]  - Dr. Claudene: [phone]   In the event of inclement weather, please call our main line at [phone] for an update on the status of any delays or closures.  Dermatology Medication Tips: Please keep the boxes that topical medications come in in order to help keep track of the instructions about where and how to use these. Pharmacies typically print the medication instructions only on the boxes and not directly on the medication tubes.   If your medication is too expensive, please contact our office at [phone] option 4 or send us  a  message through MyChart.   We are unable to tell what your co-pay for medications will be in advance as this is different depending on your insurance coverage. However, we may be able to find a substitute medication at lower cost or fill out paperwork to get insurance to cover a needed medication.   If a prior authorization is required to get your medication covered by your insurance company, please allow us  1-2 business days to complete this process.  Drug prices often vary depending on where the prescription is filled and some pharmacies may offer cheaper prices.  The website www.goodrx.com contains coupons for medications through different pharmacies. The prices here do not account for what the cost may be with help from insurance (it may be cheaper with your insurance), but the website can give you the price if you did not use any insurance.  - You can print the associated coupon and take it with your prescription to the pharmacy.  - You may also stop by our office during regular business hours and pick up a GoodRx coupon card.  - If you need your prescription sent electronically to a different pharmacy, notify our office through [facility] or by phone at [phone] option 4.     Si Usted Necesita Algo Despus de Su Visita  Tambin puede enviarnos un mensaje a travs de Clinical cytogeneticist. Por lo general respondemos a los mensajes de MyChart en el transcurso de 1 a 2 das hbiles.  Para renovar recetas, por favor pida a su farmacia que se ponga en contacto con nuestra oficina. Randi lakes de fax es [place] [phone].  Si tiene un asunto urgente cuando la clnica est cerrada y que no puede esperar hasta el siguiente da hbil, puede llamar/localizar a su doctor(a) al nmero que aparece a continuacin.   Por favor, tenga en cuenta que aunque hacemos todo lo posible para estar disponibles para asuntos urgentes fuera del horario de [place], no estamos disponibles las 24 horas del da, los 7  [address]  [address] de [place].   Si tiene un problema urgente y no puede comunicarse con nosotros, puede optar por buscar atencin mdica  en el consultorio de su doctor(a), en una clnica privada, en un centro de atencin urgente o en una sala  de emergencias.  Si tiene Engineer, drilling, por favor llame inmediatamente al 911 o vaya a la sala de emergencias.  Nmeros de bper  - Dr. Hester: [phone]  - Dra. Jackquline: [phone]  - Dr. Claudene: [phone]   En caso de inclemencias del tiempo, por favor llame a landry capes principal al [phone] para una actualizacin sobre el [place] de cualquier retraso o cierre.  Consejos para la medicacin en dermatologa: Por favor, guarde las cajas en las que vienen los medicamentos de uso tpico para ayudarle a seguir las instrucciones sobre dnde y cmo usarlos. Las farmacias generalmente imprimen las instrucciones del medicamento slo en las cajas y no directamente en los tubos del [place].   Si su medicamento es muy caro, por favor, pngase en contacto con landry rieger llamando al [phone] y presione la opcin 4 o envenos un mensaje a travs de Clinical cytogeneticist.   No podemos decirle cul ser su copago por los medicamentos por adelantado ya que esto es diferente dependiendo de la cobertura de su seguro. Sin embargo, es posible que podamos encontrar un medicamento sustituto a Audiological scientist un formulario para que el seguro cubra el medicamento que se considera necesario.   Si se requiere una autorizacin previa para que su compaa de seguros malta su medicamento, por favor permtanos de 1 a 2 das hbiles para completar este proceso.  Los precios de los medicamentos varan con frecuencia dependiendo del Environmental consultant de dnde se surte la receta y alguna farmacias pueden ofrecer precios ms baratos.  El sitio web www.goodrx.com tiene cupones para medicamentos de Health and safety inspector. Los precios aqu no tienen en cuenta lo que podra costar con  la ayuda del seguro (puede ser ms barato con su seguro), pero el sitio web puede darle el precio si no utiliz Tourist information centre manager.  - Puede imprimir el cupn correspondiente y llevarlo con su receta a la farmacia.  - Tambin puede pasar por nuestra oficina durante el horario de atencin regular y Education officer, museum una tarjeta de cupones de GoodRx.  - Si necesita que su receta se enve electrnicamente a una farmacia diferente, informe a nuestra oficina a travs de MyChart de [place] o por telfono llamando al [phone] y presione la opcin 4.

## 2024-03-13 NOTE — Progress Notes (Unsigned)
 [name] [address] [place] [address] Phone: [phone] Subjective:   Edwin [name], am serving as a scribe for Dr. Arthea Claudene.  I'm seeing this patient by the request  of:  Alla Amis, MD  CC: Back and neck pain  YEP:Dlagzrupcz  Edwin [name] is a 83 y.o. male coming in with complaint of back and neck pain. OMT on 02/13/2024. Patient states that he is doing well.   Medications patient has been prescribed:   Taking:         Reviewed prior external information including notes and imaging from previsou exam, outside providers and external EMR if available.   As well as notes that were available from care everywhere and other healthcare systems.  Past medical history, social, surgical and family history all reviewed in electronic medical record.  No pertanent information unless stated regarding to the chief complaint.   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   right lateral deltoid BASAL CELL CARCINOMA, SUPERFICIAL AND NODULAR PATTERNS   BCC (basal cell carcinoma of skin) 04/23/2017   left lat neck, excision RESIDUAL BASAL CELL CARCINOMA, MARGINS FREE   BCC (basal cell carcinoma of skin) 03/13/2017   left lateral neck  BASAL CELL CARCINOMA, NODULAR PATTERN   BCC (basal cell carcinoma of skin) 12/25/2012   left pretibial   BCC (basal cell carcinoma of skin) 05/21/2007   left medial calf - , 0.8 X 0.5CM: SUPERFICIAL BASAL CELL CARCINOMA, BASE INVOLVED   BPH (benign prostatic hyperplasia)    Cancer (HCC)    Prostate Cancer   Dysrhythmia    SVT   Hyperlipidemia    Hypertension    PTSD (post-traumatic stress disorder)    Pulmonary nodules    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 POSITIVE muscle aches  Objective  There were no vitals taken for this visit.   General: No apparent distress alert and oriented x3 mood and affect normal, dressed appropriately.  HEENT: Pupils equal, extraocular movements intact  Respiratory: Patient's speak in full sentences and does not appear short of breath  Cardiovascular: No lower extremity edema, non tender, no erythema  Gait MSK:  Back   Osteopathic findings  C2 flexed rotated and side bent right C6 flexed rotated and side bent left T3 extended rotated and side bent right inhaled rib T9 extended rotated and side bent left L2 flexed rotated and side bent right Sacrum right on right     Assessment and Plan:  No problem-specific Assessment & Plan notes found for this encounter.    Nonallopathic problems  Decision today to treat with OMT was based on Physical Exam  After verbal consent patient was treated with HVLA, ME, FPR techniques in cervical, rib, thoracic, lumbar, and sacral  areas  Patient tolerated the procedure well with improvement  in symptoms  Patient given exercises, stretches and lifestyle modifications  See medications in patient instructions if given  Patient will follow up in 4-8 weeks      The above documentation has been reviewed and is accurate and complete Riyah Bardon M Darivs Lunden, DO        Note: This dictation was prepared with Dragon dictation along with smaller phrase technology. Any transcriptional errors that result  from this process are unintentional.

## 2024-03-18 ENCOUNTER — Ambulatory Visit (INDEPENDENT_AMBULATORY_CARE_PROVIDER_SITE_OTHER): Admitting: Family Medicine

## 2024-03-18 VITALS — BP 112/82 | HR 64 | Ht 73.0 in

## 2024-03-18 DIAGNOSIS — M9908 Segmental and somatic dysfunction of rib cage: Secondary | ICD-10-CM

## 2024-03-18 DIAGNOSIS — M9902 Segmental and somatic dysfunction of thoracic region: Secondary | ICD-10-CM

## 2024-03-18 DIAGNOSIS — M9901 Segmental and somatic dysfunction of cervical region: Secondary | ICD-10-CM | POA: Diagnosis not present

## 2024-03-18 DIAGNOSIS — M9903 Segmental and somatic dysfunction of lumbar region: Secondary | ICD-10-CM

## 2024-03-18 DIAGNOSIS — M9904 Segmental and somatic dysfunction of sacral region: Secondary | ICD-10-CM

## 2024-03-18 DIAGNOSIS — M5416 Radiculopathy, lumbar region: Secondary | ICD-10-CM

## 2024-03-18 NOTE — Patient Instructions (Signed)
See me again in 6 weeks 

## 2024-03-19 ENCOUNTER — Encounter: Payer: Self-pay | Admitting: Family Medicine

## 2024-03-19 NOTE — Assessment & Plan Note (Signed)
 Tightness noted with degenerative disc disease.  Discussed icing regimen and home exercises, discussed which activities to do and which ones to avoid.  Discussed continuing to be active and stretching after certain activities.  No other changes in medication at this point.  Follow-up again 6 to 12 weeks

## 2024-04-13 ENCOUNTER — Other Ambulatory Visit (INDEPENDENT_AMBULATORY_CARE_PROVIDER_SITE_OTHER)

## 2024-04-13 ENCOUNTER — Other Ambulatory Visit: Payer: Self-pay

## 2024-04-13 DIAGNOSIS — R5383 Other fatigue: Secondary | ICD-10-CM | POA: Diagnosis not present

## 2024-04-13 LAB — TSH: TSH: 2.23 u[IU]/mL (ref 0.35–5.50)

## 2024-04-13 LAB — T3, FREE: T3, Free: 3 pg/mL (ref 2.3–4.2)

## 2024-04-13 LAB — T4, FREE: Free T4: 1.04 ng/dL (ref 0.60–1.60)

## 2024-04-16 ENCOUNTER — Ambulatory Visit: Payer: Self-pay | Admitting: Family Medicine

## 2024-04-16 LAB — THYROID PEROXIDASE ANTIBODIES (TPO) (REFL): Thyroperoxidase Ab SerPl-aCnc: <1 [IU]/mL (ref <9)

## 2024-04-16 LAB — T3, REVERSE: T3, Reverse: 28 ng/dL — ABNORMAL HIGH (ref 8–25)

## 2024-04-22 NOTE — Progress Notes (Unsigned)
  [name] [address] [place] [address] Phone: [phone] Subjective:   Edwin [name] am a scribe for Dr. Claudene.   I'm seeing this patient by the request  of:  Alla Amis, MD  CC: Multiple joint complaints  YEP:Dlagzrupcz  Edwin [name] is a 83 y.o. male coming in with complaint of discuss blood work.   Some elevation in T3 reverse on most recent lab test but TSH is at 2.23    Objective  Blood pressure (!) 162/70, pulse 62, height 6' 1 (1.854 m), SpO2 98%.   General: No apparent distress alert and oriented x3 mood and affect normal, dressed appropriately.  HEENT: Pupils equal, extraocular movements intact  Respiratory: Patient's speak in full sentences and does not appear short of breath  Cardiovascular: No lower extremity edema, non tender, no erythema  Patient does have some loss of lordosis in the lumbar spine but does seem to be sitting comfortably.    Impression and Recommendations:    The above documentation has been reviewed and is accurate and complete Alyan Hartline M Srah Ake, DO

## 2024-04-23 ENCOUNTER — Ambulatory Visit: Admitting: Family Medicine

## 2024-04-23 VITALS — BP 162/70 | HR 62 | Ht 73.0 in

## 2024-04-23 DIAGNOSIS — M5416 Radiculopathy, lumbar region: Secondary | ICD-10-CM

## 2024-04-23 NOTE — Assessment & Plan Note (Signed)
 Stable at the moment I did not make any other significant changes at this time.  We did discuss briefly for this individual being elevated but acetaminophen  could do this and with other labs being normal I do not is in management.  Follow-up with me again for manipulation and then unable to do it secondary to provider injury

## 2024-04-23 NOTE — Patient Instructions (Addendum)
 Good to see you See me again in 6 weeks

## 2024-05-26 NOTE — Progress Notes (Unsigned)
 [name] [address] [place] [address] Phone: [phone] Subjective:   Edwin [name], am serving as a scribe for Dr. Arthea [name].  I'm seeing this patient by the request  of:  Alla Amis, MD  CC:  Back pain follow-up YEP:Dlagzrupcz  04/23/2024 Stable at the moment I did not make any other significant changes at this time.  We did discuss briefly for this individual being elevated but acetaminophen  could do this and with other labs being normal I do not is in management.  Follow-up with me again for manipulation and then unable to do it secondary to provider injury    OMT on August.  Updated 05/28/2024 Edwin [name] is a 83 y.o. male coming in with complaint of back pain. No changes since last visit.  Has had tightness noted, nothing that is severe at the moment.  Tightness noted, nor did do a lot of traveling and when he was on vacation and felt better than when he was working here with stress.  No radiation of the pain.       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   right lateral deltoid BASAL CELL CARCINOMA, SUPERFICIAL AND NODULAR PATTERNS   BCC (basal cell carcinoma of skin) 04/23/2017   left lat neck, excision RESIDUAL BASAL CELL CARCINOMA, MARGINS FREE   BCC (basal cell carcinoma of skin) 03/13/2017   left lateral neck  BASAL CELL CARCINOMA, NODULAR PATTERN   BCC (basal cell carcinoma of skin) 12/25/2012   left pretibial   BCC (basal cell carcinoma of skin) 05/21/2007   left medial calf - , 0.8 X 0.5CM: SUPERFICIAL BASAL CELL CARCINOMA, BASE INVOLVED   BPH (benign prostatic hyperplasia)    Cancer (HCC)    Prostate Cancer   Dysrhythmia    SVT   Hyperlipidemia    Hypertension    PTSD (post-traumatic stress disorder)    Pulmonary nodules    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   right inferior cheek mandible SQUAMOUS CELL  CARCINOMA IN SITU   Past Surgical History:  Procedure Laterality Date   BACK SURGERY     Laminectomy x 3   COLONOSCOPY N/A 02/10/2015   Procedure: COLONOSCOPY;  Surgeon: Lamar ONEIDA Holmes, MD;  Location: [facility] ENDOSCOPY;  Service: Endoscopy;  Laterality: N/A;   CYSTOSCOPY  04/10/2022   Procedure: CYSTOSCOPY with bladder neck dilation.;  Surgeon: Rosalind Hughie Melroy NOVAK, MD;  Location: [facility] ORS;  Service: Urology;;   PROSTATECTOMY     Surgery for Urethral Stricture     TOTAL HIP ARTHROPLASTY Right 04/10/2022   Procedure: RIGHT TOTAL HIP ARTHROPLASTY ANTERIOR APPROACH;  Surgeon: Sheril Coy, MD;  Location: [facility] ORS;  Service: Orthopedics;  Laterality: Right;   Social History   Socioeconomic History   Marital status: Significant Other    Spouse name: Not on file   Number of children: Not on file   Years of education: Not on file   Highest education level: Not on file  Occupational History   Not on file  Tobacco Use   Smoking status: Former    Current packs/day: 0.00    Average packs/day: 0.7 packs/day for 5.0 years (3.5 ttl pk-yrs)    Types: Cigarettes    Start date: 07/23/1960    Quit date: 07/23/1965    Years since quitting: 58.8   Smokeless tobacco: Never  Vaping Use  Vaping status: Never Used  Substance and Sexual Activity   Alcohol use: No   Drug use: No   Sexual activity: Not on file  Other Topics Concern   Not on file  Social History Narrative   Not on file   Social Drivers of Health   Financial Resource Strain: Not on file  Food Insecurity: Not on file  Transportation Needs: Not on file  Physical Activity: Not on file  Stress: Not on file  Social Connections: Not on file   Allergies  Allergen Reactions   Amoxicillin -Pot Clavulanate Diarrhea   Bacitracin-Neomycin-Polymyxin Hives   Hydrochlorothiazide    Lipitor [Atorvastatin]     Muscle soreness   Lisinopril Cough   Bacitracin-Polymyxin B Rash   Neosporin [Neomycin-Bacitracin Zn-Polymyx] Rash   No family history  on file.   Current Outpatient Medications (Cardiovascular):    metoprolol  tartrate (LOPRESSOR ) 25 MG tablet, Take 1 tablet (25 mg total) by mouth daily as needed.   valsartan (DIOVAN) 80 MG tablet, Take 80 mg by mouth 2 (two) times daily.  Current Outpatient Medications (Respiratory):    montelukast  (SINGULAIR ) 10 MG tablet, Take 1 tablet (10 mg total) by mouth at bedtime.  Current Outpatient Medications (Analgesics):    aspirin  EC 81 MG tablet, Take 1 tablet (81 mg total) by mouth 2 (two) times daily after a meal. For 2 weeks post op for DVT prevention. Then go back to once a day.  Current Outpatient Medications (Hematological):    Cyanocobalamin  (B-12 PO), Take 1 tablet by mouth daily.  Current Outpatient Medications (Other):    ALPRAZolam  (XANAX ) 1 MG tablet, Take 1 tablet (1 mg total) by mouth at bedtime as needed for anxiety.   MAGNESIUM GLUCONATE PO, Take 400 mg by mouth at bedtime.   Multiple Vitamin (MULTIVITAMIN) tablet, Take 1 tablet by mouth daily.   pantoprazole  (PROTONIX ) 40 MG tablet, Take 1 tablet (40 mg total) by mouth daily.   terbinafine  (LAMISIL ) 250 MG tablet, Take 1 tablet (250 mg total) by mouth daily.   Ubiquinol 50 MG CAPS, Take 50 mg by mouth daily.   Reviewed prior external information including notes and imaging from  primary care provider As well as notes that were available from care everywhere and other healthcare systems.  Past medical history, social, surgical and family history all reviewed in electronic medical record.  No pertanent information unless stated regarding to the chief complaint.   Review of Systems:  No headache, visual changes, nausea, vomiting, diarrhea, constipation, dizziness, abdominal pain, skin rash, fevers, chills, night sweats, weight loss, swollen lymph nodes, body aches, joint swelling, chest pain, shortness of breath, mood changes. POSITIVE muscle aches  Objective  Blood pressure 110/76, pulse 64, height 6' 1 (1.854 m),  SpO2 98%.   General: No apparent distress alert and oriented x3 mood and affect normal, dressed appropriately.  HEENT: Pupils equal, extraocular movements intact  Respiratory: Patient's speak in full sentences and does not appear short of breath  Cardiovascular: No lower extremity edema, non tender, no erythema  Back exam does have significant loss of lordosis.  Tightness noted with the paraspinal musculature.  Patient has tightness of the hamstrings noted.   Osteopathic findings C2 flexed rotated and side bent right C5 flexed rotated and side bent left T3 extended rotated and side bent right inhaled third rib L1 rotated and side bent right L2 flexed rotated and side bent right Sacrum right on right    Impression and Recommendations:  Lumbar radiculopathy Chronic problem overall,  does have the spinal stenosis.  Continue to monitor, discussed icing regimen and home exercises, increase activity slowly.  Follow-up again in 6 to 12 weeks otherwise.  No change in medications.  Thoracic aortic aneurysm Patient was recommended to get another echocardiogram in December of this year.     Decision today to treat with OMT was based on Physical Exam  After verbal consent patient was treated with HVLA, ME, FPR techniques in cervical, thoracic, rib, lumbar and sacral areas, all areas are chronic   Patient tolerated the procedure well with improvement in symptoms  Patient given exercises, stretches and lifestyle modifications  See medications in patient instructions if given  Patient will follow up in 4-8 weeks   The above documentation has been reviewed and is accurate and complete Joshuan Bolander M Deshay Blumenfeld, DO

## 2024-05-28 ENCOUNTER — Ambulatory Visit: Admitting: Family Medicine

## 2024-05-28 VITALS — BP 110/76 | HR 64 | Ht 73.0 in

## 2024-05-28 DIAGNOSIS — M9902 Segmental and somatic dysfunction of thoracic region: Secondary | ICD-10-CM

## 2024-05-28 DIAGNOSIS — M9901 Segmental and somatic dysfunction of cervical region: Secondary | ICD-10-CM | POA: Diagnosis not present

## 2024-05-28 DIAGNOSIS — I7121 Aneurysm of the ascending aorta, without rupture: Secondary | ICD-10-CM

## 2024-05-28 DIAGNOSIS — M9904 Segmental and somatic dysfunction of sacral region: Secondary | ICD-10-CM | POA: Diagnosis not present

## 2024-05-28 DIAGNOSIS — M9908 Segmental and somatic dysfunction of rib cage: Secondary | ICD-10-CM | POA: Diagnosis not present

## 2024-05-28 DIAGNOSIS — M9903 Segmental and somatic dysfunction of lumbar region: Secondary | ICD-10-CM

## 2024-05-28 DIAGNOSIS — M5416 Radiculopathy, lumbar region: Secondary | ICD-10-CM

## 2024-05-28 NOTE — Assessment & Plan Note (Signed)
 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  Continue to monitor, discussed icing regimen and home exercises, increase activity slowly.  Follow-up again in 6 to 12 weeks otherwise.  No change in medications.

## 2024-05-28 NOTE — Assessment & Plan Note (Signed)
 Patient was recommended to get another echocardiogram in December of this year.

## 2024-06-12 ENCOUNTER — Other Ambulatory Visit: Payer: Self-pay

## 2024-06-12 DIAGNOSIS — R079 Chest pain, unspecified: Secondary | ICD-10-CM

## 2024-06-29 ENCOUNTER — Other Ambulatory Visit: Payer: Self-pay | Admitting: Cardiovascular Disease

## 2024-06-29 DIAGNOSIS — E782 Mixed hyperlipidemia: Secondary | ICD-10-CM

## 2024-06-29 DIAGNOSIS — I77810 Thoracic aortic ectasia: Secondary | ICD-10-CM

## 2024-06-29 DIAGNOSIS — I10 Essential (primary) hypertension: Secondary | ICD-10-CM

## 2024-06-29 DIAGNOSIS — I2510 Atherosclerotic heart disease of native coronary artery without angina pectoris: Secondary | ICD-10-CM

## 2024-06-30 NOTE — Progress Notes (Unsigned)
 [name] [address] [place] [address] Phone: [phone] Subjective:    I'm seeing this patient by the request  of:  Alla Amis, MD  CC:   YEP:Dlagzrupcz  Edwin [name] is a 83 y.o. male coming in with complaint of back and neck pain. OMT on 05/28/2024. Patient states   Medications patient has been prescribed:   Taking:         Reviewed prior external information including notes and imaging from previsou exam, outside providers and external EMR if available.   As well as notes that were available from care everywhere and other healthcare systems.  Past medical history, social, surgical and family history all reviewed in electronic medical record.  No pertanent information unless stated regarding to the chief complaint.   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   right lateral deltoid BASAL CELL CARCINOMA, SUPERFICIAL AND NODULAR PATTERNS   BCC (basal cell carcinoma of skin) 04/23/2017   left lat neck, excision RESIDUAL BASAL CELL CARCINOMA, MARGINS FREE   BCC (basal cell carcinoma of skin) 03/13/2017   left lateral neck  BASAL CELL CARCINOMA, NODULAR PATTERN   BCC (basal cell carcinoma of skin) 12/25/2012   left pretibial   BCC (basal cell carcinoma of skin) 05/21/2007   left medial calf - , 0.8 X 0.5CM: SUPERFICIAL BASAL CELL CARCINOMA, BASE INVOLVED   BPH (benign prostatic hyperplasia)    Cancer (HCC)    Prostate Cancer   Dysrhythmia    SVT   Hyperlipidemia    Hypertension    PTSD (post-traumatic stress disorder)    Pulmonary nodules    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   right inferior cheek mandible SQUAMOUS CELL CARCINOMA IN SITU    Allergies  Allergen Reactions   Amoxicillin -Pot Clavulanate Diarrhea   Bacitracin-Neomycin-Polymyxin Hives   Hydrochlorothiazide    Lipitor [Atorvastatin]     Muscle soreness    Lisinopril Cough   Bacitracin-Polymyxin B Rash   Neosporin [Neomycin-Bacitracin Zn-Polymyx] Rash     Review of Systems:  No headache, visual changes, nausea, vomiting, diarrhea, constipation, dizziness, abdominal pain, skin rash, fevers, chills, night sweats, weight loss, swollen lymph nodes, body aches, joint swelling, chest pain, shortness of breath, mood changes. POSITIVE muscle aches  Objective  There were no vitals taken for this visit.   General: No apparent distress alert and oriented x3 mood and affect normal, dressed appropriately.  HEENT: Pupils equal, extraocular movements intact  Respiratory: Patient's speak in full sentences and does not appear short of breath  Cardiovascular: No lower extremity edema, non tender, no erythema  Gait MSK:  Back   Osteopathic findings  C2 flexed rotated and side bent right C6 flexed rotated and side bent left T3 extended rotated and side bent right inhaled rib T9 extended rotated and side bent left L2 flexed rotated and side bent right Sacrum right on right       Assessment and Plan:  No problem-specific Assessment & Plan notes found for this encounter.    Nonallopathic problems  Decision today to treat with OMT was based on Physical Exam  After verbal consent patient was treated with HVLA, ME, FPR techniques in cervical, rib, thoracic, lumbar, and sacral  areas  Patient tolerated the procedure well with improvement in symptoms  Patient given exercises, stretches and lifestyle modifications  See medications in patient instructions if given  Patient will follow up in 4-8 weeks             Note: This dictation was prepared with Dragon dictation along with smaller phrase technology. Any transcriptional errors that result from this process are unintentional.

## 2024-07-02 ENCOUNTER — Encounter: Payer: Self-pay | Admitting: Family Medicine

## 2024-07-02 ENCOUNTER — Ambulatory Visit: Admitting: Family Medicine

## 2024-07-02 VITALS — BP 138/82 | HR 66 | Ht 73.0 in

## 2024-07-02 DIAGNOSIS — C61 Malignant neoplasm of prostate: Secondary | ICD-10-CM

## 2024-07-02 DIAGNOSIS — M5416 Radiculopathy, lumbar region: Secondary | ICD-10-CM

## 2024-07-02 DIAGNOSIS — I7121 Aneurysm of the ascending aorta, without rupture: Secondary | ICD-10-CM | POA: Diagnosis not present

## 2024-07-02 DIAGNOSIS — M9902 Segmental and somatic dysfunction of thoracic region: Secondary | ICD-10-CM

## 2024-07-02 DIAGNOSIS — M9903 Segmental and somatic dysfunction of lumbar region: Secondary | ICD-10-CM

## 2024-07-02 DIAGNOSIS — M9904 Segmental and somatic dysfunction of sacral region: Secondary | ICD-10-CM | POA: Diagnosis not present

## 2024-07-02 NOTE — Assessment & Plan Note (Signed)
 Tightness with known degenerative disc disease.  Discussed icing regimen and home exercises.  Discussed which activities to do and which ones to avoid.  Increase activity slowly.  Follow-up again in 6 to 12 weeks.

## 2024-07-02 NOTE — Assessment & Plan Note (Signed)
PSA has been stable.

## 2024-07-02 NOTE — Assessment & Plan Note (Signed)
 Echocardiogram scheduled next month

## 2024-07-02 NOTE — Patient Instructions (Signed)
 See me in 6-8 weeks

## 2024-07-08 ENCOUNTER — Telehealth: Payer: Self-pay | Admitting: Cardiology

## 2024-07-08 NOTE — Telephone Encounter (Addendum)
 Spoke with pt and advised will forward question regarding power plate use to Dr Kennyth who is out of the office this week.  Assisted pt with scheduling appointment with Dr Kennyth on 08/04/2024 at 8am.  Pt thanked RN for the call back.

## 2024-07-08 NOTE — Telephone Encounter (Signed)
 Pt would like to know if he's able to use the power plate at the gym.

## 2024-07-13 NOTE — Telephone Encounter (Signed)
 Spoke with the patient and advised on Dr. Shaune recommendations. Patient verbalized understanding.  Patient would like to keep his appointment on 1/13.

## 2024-07-30 ENCOUNTER — Other Ambulatory Visit: Payer: Self-pay | Admitting: Cardiovascular Disease

## 2024-07-30 DIAGNOSIS — E782 Mixed hyperlipidemia: Secondary | ICD-10-CM

## 2024-07-30 DIAGNOSIS — I10 Essential (primary) hypertension: Secondary | ICD-10-CM

## 2024-07-30 DIAGNOSIS — I77810 Thoracic aortic ectasia: Secondary | ICD-10-CM

## 2024-07-30 DIAGNOSIS — I2510 Atherosclerotic heart disease of native coronary artery without angina pectoris: Secondary | ICD-10-CM

## 2024-07-31 ENCOUNTER — Ambulatory Visit

## 2024-07-31 ENCOUNTER — Ambulatory Visit: Payer: Self-pay | Admitting: Cardiovascular Disease

## 2024-07-31 DIAGNOSIS — I2510 Atherosclerotic heart disease of native coronary artery without angina pectoris: Secondary | ICD-10-CM

## 2024-07-31 DIAGNOSIS — I77810 Thoracic aortic ectasia: Secondary | ICD-10-CM

## 2024-07-31 LAB — ECHOCARDIOGRAM COMPLETE
AR max vel: 3.77 cm2
AV Area VTI: 3.53 cm2
AV Area mean vel: 3.47 cm2
AV Mean grad: 6.5 mmHg
AV Peak grad: 11.4 mmHg
Ao pk vel: 1.69 m/s
Area-P 1/2: 3.21 cm2
Calc EF: 43.4 %
S' Lateral: 4.6 cm
Single Plane A2C EF: 42.2 %
Single Plane A4C EF: 44.4 %

## 2024-08-03 NOTE — Progress Notes (Signed)
 " Electrophysiology Office Note:   Date:  08/07/2024  ID:  Edwin [name], DOB October 09, 1940, MRN [id number]  Primary Cardiologist: None Primary Heart Failure: None Electrophysiologist: Fonda Kitty, MD      History of Present Illness:   Edwin [name] is a 84 y.o. male with h/o hypertension, coronary calcifications on CT who is being seen today for evaluation for SVT at the request of Dr. Court.   Discussed the use of AI scribe software for clinical note transcription with the patient, who gave verbal consent to proceed.  History of Present Illness Edwin [name] is an 84 year old male with supraventricular tachycardia who presents for follow-up regarding his heart rhythm management.  He has a history of supraventricular tachycardia (SVT) with episodes characterized by rapid acceleration of the atria. Initially, he opted not to treat the condition. Recently, he experienced an SVT episode while walking to an appointment, which resolved spontaneously. These episodes are unpredictable and occur without warning, not correlating with stressful situations like court arguments.  He inquired about the safety of using a power plate, a vibrating exercise device, given his heart condition.  He monitors his blood pressure at home and recently noted a reading of 138/82 mmHg. He understands the importance of having a reliable blood pressure cuff at home, especially given his age.  He has been prescribed a medication to take during SVT episodes to prevent emergency room visits. He has not used it frequently and is advised to take it if an episode is prolonged or distressing. The medication can be taken up to three times a day if necessary.  An echocardiogram was ordered, during which he experienced an SVT episode that resolved before the test was completed.    Review of systems complete and found to be negative unless listed in HPI.   EP Information / Studies Reviewed:    EKG is not ordered today. EKG  from 06/11/24 reviewed which showed sinus rhythm with first degree AV delay, RBBB and LPFB.  EKG Interpretation Date/Time:  Tuesday August 04 2024 08:02:47 EST Ventricular Rate:  70 PR Interval:  254 QRS Duration:  138 QT Interval:  446 QTC Calculation: 481 R Axis:   97  Text Interpretation: Sinus rhythm with 1st degree A-V block Rightward axis Non-specific intra-ventricular conduction block When compared with ECG of 12-Jun-2023 14:51, Non-specific intra-ventricular conduction block has replaced Right bundle branch block Confirmed by Kitty Fonda [phone]) on 08/04/2024 8:12:02 AM   Zio Monitor 07/08/23   Patient had a min HR of 43 bpm, max HR of 174 bpm, and avg HR of 67 bpm. Predominant underlying rhythm was Sinus Rhythm. First Degree AV Block was present. 44 Supraventricular Tachycardia runs occurred, the run with the fastest interval lasting 1 hour 9  mins with a max rate of 174 bpm, the longest lasting 6 hours 30 mins with an avg rate of 149 bpm. Supraventricular Tachycardia was detected within +/- 45 seconds of symptomatic patient event(s). Isolated SVEs were rare (<1.0%), SVE Couplets were rare  (<1.0%), and SVE Triplets were rare (<1.0%). Isolated VEs were rare (<1.0%), and no VE Couplets or VE Triplets were present.   SR/SB/ST Occasional PACs/PVCs 44 episodes of SVT with durations up to 6 hours Needs ROV with me or an APP to discuss    Echo  07/31/24:  1. Left ventricular ejection fraction, by estimation, is 45 to 50%. The  left ventricle has mildly decreased function. The left ventricular  internal cavity size was mildly dilated.  There is mild asymmetric left  ventricular hypertrophy. Left ventricular  diastolic parameters are consistent with Grade I diastolic dysfunction  (impaired relaxation). The average left ventricular global longitudinal  strain is -10.4 %. The global longitudinal strain is abnormal.  Spontaneously converted out of SVT right before  images were  obtained.   2. Right ventricular systolic function is normal. The right ventricular  size is mildly enlarged.   3. Left atrial size was severely dilated.   4. Right atrial size was mildly dilated.   5. The mitral valve is grossly normal. Mild mitral valve regurgitation.  No evidence of mitral stenosis.   6. The aortic valve has an indeterminant number of cusps. There is  moderate calcification of the aortic valve. Aortic valve regurgitation is  moderate. Aortic valve sclerosis/calcification is present, without any  evidence of aortic stenosis.   7. Aortic dilatation noted. 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   Physical Exam:   VS:  BP (!) 170/68   Pulse 75   Ht 6' 1 (1.854 m)   Wt 230 lb (104.3 kg)   SpO2 97%   BMI 30.34 kg/m    Wt Readings from Last 3 Encounters:  08/04/24 230 lb (104.3 kg)  08/13/23 233 lb 3.2 oz (105.8 kg)  07/19/23 230 lb 9.6 oz (104.6 kg)     GEN: Well nourished, well developed in no acute distress NECK: No JVD CARDIAC: Normal rate and regular rhythm RESPIRATORY:  Clear to auscultation without rales, wheezing or rhonchi  ABDOMEN: Soft, non-tender, non-distended EXTREMITIES:  No edema; No deformity   ASSESSMENT AND PLAN:    #. SVT: Numerous episodes on a previous Zio monitor, lasting up to 6 hours. These appear to be sudden onset/offset. Based on Zio there is no irregularity or flutter waves. 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  - We have discussed EP study and ablation on multiple occasions and patient is not interested at this time.  - Will not place on any scheduled beta-blockers or calcium channel blockers at this time due to presence of first degree AV delay, RBBB, LPFB. He can take a metoprolol  tartrate 25mg  on an as needed basis for sustained SVT.  #. 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   #.  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 No clinical signs or symptoms of HF. Will have him follow up with primary cardiologist, Dr. Court, for further management.   #. Hypertension -Above goal today.  Recommend checking blood pressures 1-2 times per week at home and recording the values.  Recommend bringing these recordings to the primary care physician.   Signed, Fonda Kitty, MD  "

## 2024-08-04 ENCOUNTER — Ambulatory Visit: Attending: Cardiology | Admitting: Cardiology

## 2024-08-04 ENCOUNTER — Encounter: Payer: Self-pay | Admitting: Cardiology

## 2024-08-04 VITALS — BP 170/68 | HR 75 | Ht 73.0 in | Wt 230.0 lb

## 2024-08-04 DIAGNOSIS — I445 Left posterior fascicular block: Secondary | ICD-10-CM | POA: Diagnosis not present

## 2024-08-04 DIAGNOSIS — I5022 Chronic systolic (congestive) heart failure: Secondary | ICD-10-CM

## 2024-08-04 DIAGNOSIS — I440 Atrioventricular block, first degree: Secondary | ICD-10-CM

## 2024-08-04 DIAGNOSIS — I4510 Unspecified right bundle-branch block: Secondary | ICD-10-CM | POA: Diagnosis not present

## 2024-08-04 DIAGNOSIS — I10 Essential (primary) hypertension: Secondary | ICD-10-CM

## 2024-08-04 DIAGNOSIS — I471 Supraventricular tachycardia, unspecified: Secondary | ICD-10-CM | POA: Diagnosis not present

## 2024-08-04 NOTE — Progress Notes (Unsigned)
 " [name] [address] [place] [address] Phone: [phone] Subjective:   Edwin [name], am serving as a scribe for Dr. Arthea Claudene.  I'm seeing this patient by the request  of:  Trudy Dorn BRAVO, MD  CC: Back and neck pain follow-up  YEP:Dlagzrupcz  Edwin [name] is a 84 y.o. male coming in with complaint of back and neck pain. OMT on 07/02/2024. Patient states that he has been doing well. Saw cardiologist   Medications patient has been prescribed:   Taking:         Reviewed prior external information including notes and imaging from previsou exam, outside providers and external EMR if available.   As well as notes that were available from care everywhere and other healthcare systems.  Past medical history, social, surgical and family history all reviewed in electronic medical record.  No pertanent information unless stated regarding to the chief complaint.   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   right lateral deltoid BASAL CELL CARCINOMA, SUPERFICIAL AND NODULAR PATTERNS   BCC (basal cell carcinoma of skin) 04/23/2017   left lat neck, excision RESIDUAL BASAL CELL CARCINOMA, MARGINS FREE   BCC (basal cell carcinoma of skin) 03/13/2017   left lateral neck  BASAL CELL CARCINOMA, NODULAR PATTERN   BCC (basal cell carcinoma of skin) 12/25/2012   left pretibial   BCC (basal cell carcinoma of skin) 05/21/2007   left medial calf - , 0.8 X 0.5CM: SUPERFICIAL BASAL CELL CARCINOMA, BASE INVOLVED   BPH (benign prostatic hyperplasia)    Cancer (HCC)    Prostate Cancer   Dysrhythmia    SVT   Hyperlipidemia    Hypertension    PTSD (post-traumatic stress disorder)    Pulmonary nodules    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 POSITIVE muscle aches  Objective  Blood pressure 128/80, pulse 68, height 6' 1 (1.854 m), SpO2 95%.   General: No apparent distress alert and oriented x3 mood and affect normal, dressed appropriately.  HEENT: Pupils equal, extraocular movements intact  Respiratory: Patient's speak in full sentences and does not appear short of breath  Cardiovascular: No lower extremity edema, non tender, no erythema  Gait MSK:  Back Loss of lordosis tightness with FABER does have some tightness noted with flexion and extension of the back.   Osteopathic findings  C2 flexed rotated and side bent right C7 flexed rotated and side bent right  T3 extended rotated and side bent right inhaled rib T9 extended rotated and side bent left L1 flexed rotated and side bent right L3 F RS left  Sacrum right on right     Assessment and Plan:  Lumbar radiculopathy Discussed icing regimen and home exercises, discussed which activities to do and which ones to avoid.  Increase activity slowly.  Follow-up again in 6 to 8 weeks    Nonallopathic problems  Decision today to treat with OMT was based on Physical Exam  After verbal consent patient was treated with HVLA, ME, FPR techniques in cervical, rib, thoracic, lumbar, and  sacral  areas avoided HVLA on the cervical spine  Patient tolerated the procedure well with improvement in symptoms  Patient given exercises, stretches and lifestyle modifications  See medications in patient instructions if given  Patient will follow up in 4-8 weeks     The above documentation has been reviewed and is accurate and complete Taralyn Ferraiolo M Ruthvik Barnaby, DO         Note: This dictation was prepared with Dragon dictation along with smaller  phrase technology. Any transcriptional errors that result from this process are unintentional.            [1]  Allergies Allergen Reactions   Amoxicillin -Pot Clavulanate Diarrhea   Bacitracin-Neomycin-Polymyxin Hives   Hydrochlorothiazide    Lipitor [Atorvastatin]     Muscle soreness   Lisinopril Cough   Bacitracin-Polymyxin B Rash   Neosporin [Neomycin-Bacitracin Zn-Polymyx] Rash   "

## 2024-08-04 NOTE — Patient Instructions (Signed)
 Medication Instructions:  Your physician recommends that you continue on your current medications as directed. Please refer to the Current Medication list given to you today.  *If you need a refill on your cardiac medications before your next appointment, please call your pharmacy*  Follow-Up: At [facility], you and your health needs are our priority.  As part of our continuing mission to provide you with exceptional heart care, our providers are all part of one team.  This team includes your primary Cardiologist (physician) and Advanced Practice Providers or APPs (Physician Assistants and Nurse Practitioners) who all work together to provide you with the care you need, when you need it.  As needed with Dr. Kennyth

## 2024-08-06 ENCOUNTER — Ambulatory Visit: Admitting: Family Medicine

## 2024-08-06 ENCOUNTER — Encounter: Payer: Self-pay | Admitting: Family Medicine

## 2024-08-06 VITALS — BP 128/80 | HR 68 | Ht 73.0 in

## 2024-08-06 DIAGNOSIS — M9904 Segmental and somatic dysfunction of sacral region: Secondary | ICD-10-CM

## 2024-08-06 DIAGNOSIS — M9902 Segmental and somatic dysfunction of thoracic region: Secondary | ICD-10-CM

## 2024-08-06 DIAGNOSIS — M9903 Segmental and somatic dysfunction of lumbar region: Secondary | ICD-10-CM

## 2024-08-06 DIAGNOSIS — M9901 Segmental and somatic dysfunction of cervical region: Secondary | ICD-10-CM

## 2024-08-06 DIAGNOSIS — M9908 Segmental and somatic dysfunction of rib cage: Secondary | ICD-10-CM | POA: Diagnosis not present

## 2024-08-06 DIAGNOSIS — M5416 Radiculopathy, lumbar region: Secondary | ICD-10-CM | POA: Diagnosis not present

## 2024-08-06 NOTE — Assessment & Plan Note (Signed)
Discussed icing regimen and home exercises, discussed which activities to do and which ones to avoid.  Increase activity slowly.  Follow-up again in 6 to 8 weeks

## 2024-08-06 NOTE — Patient Instructions (Signed)
 See me in 6-8 weeks

## 2024-09-10 ENCOUNTER — Ambulatory Visit: Admitting: Family Medicine

## 2024-10-15 ENCOUNTER — Ambulatory Visit: Admitting: Family Medicine

## 2025-03-03 ENCOUNTER — Ambulatory Visit: Admitting: Dermatology
